# Patient Record
Sex: Male | Born: 1950 | Race: White | Hispanic: No | Marital: Married | Smoking: Former smoker
Health system: Southern US, Community
[De-identification: ages and names within clinical notes are randomized; demographics above are authoritative.]

## PROBLEM LIST (undated history)

## (undated) DIAGNOSIS — R030 Elevated blood-pressure reading, without diagnosis of hypertension: Secondary | ICD-10-CM

## (undated) DIAGNOSIS — I2510 Atherosclerotic heart disease of native coronary artery without angina pectoris: Secondary | ICD-10-CM

## (undated) DIAGNOSIS — N529 Male erectile dysfunction, unspecified: Secondary | ICD-10-CM

## (undated) DIAGNOSIS — E785 Hyperlipidemia, unspecified: Secondary | ICD-10-CM

## (undated) DIAGNOSIS — E291 Testicular hypofunction: Secondary | ICD-10-CM

## (undated) DIAGNOSIS — R002 Palpitations: Secondary | ICD-10-CM

## (undated) DIAGNOSIS — Z8679 Personal history of other diseases of the circulatory system: Secondary | ICD-10-CM

## (undated) DIAGNOSIS — K449 Diaphragmatic hernia without obstruction or gangrene: Secondary | ICD-10-CM

## (undated) DIAGNOSIS — G4731 Primary central sleep apnea: Secondary | ICD-10-CM

## (undated) DIAGNOSIS — M1990 Unspecified osteoarthritis, unspecified site: Secondary | ICD-10-CM

## (undated) DIAGNOSIS — I10 Essential (primary) hypertension: Secondary | ICD-10-CM

## (undated) DIAGNOSIS — I480 Paroxysmal atrial fibrillation: Secondary | ICD-10-CM

## (undated) DIAGNOSIS — K219 Gastro-esophageal reflux disease without esophagitis: Secondary | ICD-10-CM

## (undated) HISTORY — DX: Testicular hypofunction: E29.1

## (undated) HISTORY — DX: Primary central sleep apnea: G47.31

## (undated) HISTORY — PX: EYE SURGERY: SHX253

## (undated) HISTORY — DX: Hyperlipidemia, unspecified: E78.5

## (undated) HISTORY — PX: VASECTOMY: SHX75

## (undated) HISTORY — DX: Male erectile dysfunction, unspecified: N52.9

## (undated) HISTORY — DX: Essential (primary) hypertension: I10

## (undated) HISTORY — DX: Diaphragmatic hernia without obstruction or gangrene: K44.9

## (undated) HISTORY — PX: CARDIAC CATHETERIZATION: SHX172

## (undated) HISTORY — DX: Unspecified osteoarthritis, unspecified site: M19.90

## (undated) HISTORY — DX: Gastro-esophageal reflux disease without esophagitis: K21.9

## (undated) HISTORY — DX: Paroxysmal atrial fibrillation: I48.0

## (undated) HISTORY — DX: Personal history of other diseases of the circulatory system: Z86.79

## (undated) HISTORY — DX: Elevated blood-pressure reading, without diagnosis of hypertension: R03.0

---

## 1985-04-10 HISTORY — PX: NEPHRECTOMY LIVING DONOR: SUR877

## 1992-04-10 HISTORY — PX: SPINE SURGERY: SHX786

## 1992-04-10 HISTORY — PX: BACK SURGERY: SHX140

## 1998-12-03 ENCOUNTER — Emergency Department (HOSPITAL_COMMUNITY): Admission: EM | Admit: 1998-12-03 | Discharge: 1998-12-03 | Payer: Self-pay | Admitting: Emergency Medicine

## 1998-12-04 ENCOUNTER — Encounter: Payer: Self-pay | Admitting: Emergency Medicine

## 1998-12-04 ENCOUNTER — Encounter: Payer: Self-pay | Admitting: General Surgery

## 1999-06-14 ENCOUNTER — Ambulatory Visit (HOSPITAL_COMMUNITY): Admission: RE | Admit: 1999-06-14 | Discharge: 1999-06-14 | Payer: Self-pay | Admitting: *Deleted

## 2004-02-24 ENCOUNTER — Ambulatory Visit: Payer: Self-pay | Admitting: Sports Medicine

## 2004-03-29 ENCOUNTER — Ambulatory Visit: Payer: Self-pay | Admitting: Family Medicine

## 2004-04-20 ENCOUNTER — Ambulatory Visit: Payer: Self-pay | Admitting: Sports Medicine

## 2011-01-17 ENCOUNTER — Encounter: Payer: Self-pay | Admitting: Family Medicine

## 2011-01-17 ENCOUNTER — Ambulatory Visit (INDEPENDENT_AMBULATORY_CARE_PROVIDER_SITE_OTHER): Payer: Managed Care, Other (non HMO) | Admitting: Family Medicine

## 2011-01-17 VITALS — BP 140/80 | HR 84 | Temp 98.7°F | Ht 68.75 in | Wt 204.0 lb

## 2011-01-17 DIAGNOSIS — IMO0002 Reserved for concepts with insufficient information to code with codable children: Secondary | ICD-10-CM

## 2011-01-17 DIAGNOSIS — S86919A Strain of unspecified muscle(s) and tendon(s) at lower leg level, unspecified leg, initial encounter: Secondary | ICD-10-CM

## 2011-01-17 NOTE — Progress Notes (Signed)
  Subjective:    Patient ID: Gabriel Aguilar, male    DOB: December 24, 1950, 60 y.o.   MRN: [id number]  HPI 60 yr old male to establish with [place] and for left leg pain. He had seen [facility] in [facility] before transfering here. On 11-22-10 while pushing a load of stones up a hill at home he felt a sharp tearing pain in the left calf. This slowly improved until he re-injured the calf on 12-12-10 when he was running to catch a boat. Now it has again been slowly improving with some occasional pain. It swelled at first but not now.    Review of Systems  Constitutional: Negative.   Respiratory: Negative.   Cardiovascular: Negative.   Musculoskeletal: Positive for myalgias.       Objective:   Physical Exam  Constitutional: He appears well-developed and well-nourished.       Walks normally   Cardiovascular: Normal rate, regular rhythm, normal heart sounds and intact distal pulses.   Pulmonary/Chest: Effort normal and breath sounds normal.  Musculoskeletal: Normal range of motion. He exhibits no edema.       Mildly tender in the left calf, no cords felt, negative Homans           Assessment & Plan:  Rest, this should heal in time. He will set up a cpx with labs soon

## 2011-02-16 ENCOUNTER — Other Ambulatory Visit (INDEPENDENT_AMBULATORY_CARE_PROVIDER_SITE_OTHER): Payer: Managed Care, Other (non HMO)

## 2011-02-16 DIAGNOSIS — Z0000 Encounter for general adult medical examination without abnormal findings: Secondary | ICD-10-CM

## 2011-02-16 LAB — CBC WITH DIFFERENTIAL/PLATELET
Basophils Absolute: 0 10*3/uL (ref 0.0–0.1)
Basophils Relative: 0.4 % (ref 0.0–3.0)
Eosinophils Absolute: 0.2 10*3/uL (ref 0.0–0.7)
Eosinophils Relative: 2 % (ref 0.0–5.0)
HCT: 42.4 % (ref 39.0–52.0)
Hemoglobin: 14.3 g/dL (ref 13.0–17.0)
Lymphocytes Relative: 44.9 % (ref 12.0–46.0)
Lymphs Abs: 4.8 10*3/uL — ABNORMAL HIGH (ref 0.7–4.0)
MCHC: 33.8 g/dL (ref 30.0–36.0)
MCV: 93.4 fl (ref 78.0–100.0)
Monocytes Absolute: 0.7 10*3/uL (ref 0.1–1.0)
Monocytes Relative: 6.9 % (ref 3.0–12.0)
Neutro Abs: 4.9 10*3/uL (ref 1.4–7.7)
Neutrophils Relative %: 45.8 % (ref 43.0–77.0)
Platelets: 348 10*3/uL (ref 150.0–400.0)
RBC: 4.53 Mil/uL (ref 4.22–5.81)
RDW: 13.7 % (ref 11.5–14.6)
WBC: 10.6 10*3/uL — ABNORMAL HIGH (ref 4.5–10.5)

## 2011-02-16 LAB — POCT URINALYSIS DIPSTICK
Bilirubin, UA: NEGATIVE
Glucose, UA: NEGATIVE
Ketones, UA: NEGATIVE
Leukocytes, UA: NEGATIVE
Nitrite, UA: NEGATIVE
Protein, UA: NEGATIVE
Spec Grav, UA: <=1.005
Urobilinogen, UA: 0.2
pH, UA: 5

## 2011-02-16 LAB — BASIC METABOLIC PANEL
BUN: 19 mg/dL (ref 6–23)
CO2: 28 mEq/L (ref 19–32)
Calcium: 9.3 mg/dL (ref 8.4–10.5)
Chloride: 104 mEq/L (ref 96–112)
Creatinine, Ser: 1.1 mg/dL (ref 0.4–1.5)
GFR: 73.58 mL/min (ref >60.00)
Glucose, Bld: 87 mg/dL (ref 70–99)
Potassium: 4.7 mEq/L (ref 3.5–5.1)
Sodium: 138 mEq/L (ref 135–145)

## 2011-02-16 LAB — HEPATIC FUNCTION PANEL
ALT: 31 U/L (ref 0–53)
AST: 25 U/L (ref 0–37)
Albumin: 4.2 g/dL (ref 3.5–5.2)
Alkaline Phosphatase: 67 U/L (ref 39–117)
Bilirubin, Direct: 0.1 mg/dL (ref 0.0–0.3)
Total Bilirubin: 0.8 mg/dL (ref 0.3–1.2)
Total Protein: 6.7 g/dL (ref 6.0–8.3)

## 2011-02-16 LAB — LIPID PANEL
Cholesterol: 257 mg/dL — ABNORMAL HIGH (ref 0–200)
HDL: 45.3 mg/dL (ref >39.00)
Total CHOL/HDL Ratio: 6
Triglycerides: 219 mg/dL — ABNORMAL HIGH (ref 0.0–149.0)
VLDL: 43.8 mg/dL — ABNORMAL HIGH (ref 0.0–40.0)

## 2011-02-16 LAB — LDL CHOLESTEROL, DIRECT: Direct LDL: 180.7 mg/dL

## 2011-02-16 LAB — TSH: TSH: 0.86 u[IU]/mL (ref 0.35–5.50)

## 2011-02-16 LAB — PSA: PSA: 1.52 ng/mL (ref 0.10–4.00)

## 2011-02-20 NOTE — Progress Notes (Signed)
Quick Note:  I left voice message with results. ______ 

## 2011-02-22 ENCOUNTER — Encounter: Payer: Self-pay | Admitting: Family Medicine

## 2011-02-22 ENCOUNTER — Ambulatory Visit (INDEPENDENT_AMBULATORY_CARE_PROVIDER_SITE_OTHER): Payer: Managed Care, Other (non HMO) | Admitting: Family Medicine

## 2011-02-22 VITALS — BP 130/72 | HR 87 | Temp 99.1°F | Ht 68.5 in | Wt 205.0 lb

## 2011-02-22 DIAGNOSIS — E291 Testicular hypofunction: Secondary | ICD-10-CM

## 2011-02-22 DIAGNOSIS — Z23 Encounter for immunization: Secondary | ICD-10-CM

## 2011-02-22 DIAGNOSIS — Z0000 Encounter for general adult medical examination without abnormal findings: Secondary | ICD-10-CM

## 2011-02-22 LAB — TESTOSTERONE: Testosterone: 197.64 ng/dL — ABNORMAL LOW (ref 350.00–890.00)

## 2011-02-22 MED ORDER — TADALAFIL 20 MG PO TABS: 20.0000 mg | ORAL_TABLET | Freq: Every day | ORAL | Status: AC | PRN

## 2011-02-22 MED ORDER — TESTOSTERONE 20.25 MG/ACT (1.62%) TD GEL: 4.0000 | Freq: Every day | TRANSDERMAL | Status: DC

## 2011-02-22 NOTE — Progress Notes (Signed)
  Subjective:    Patient ID: Gabriel Aguilar, male    DOB: 18-May-1950, 60 y.o.   MRN: [id number]  HPI 60 yr old male for a cpx. He feels well but needs some refills. He has used Androgel off and on for about 5 years. He has been off it for 6 months now and wants to start it again. He wants to try Cialis again.    Review of Systems  Constitutional: Negative.   HENT: Negative.   Eyes: Negative.   Respiratory: Negative.   Cardiovascular: Negative.   Gastrointestinal: Negative.   Genitourinary: Negative.   Musculoskeletal: Negative.   Skin: Negative.   Neurological: Negative.   Hematological: Negative.   Psychiatric/Behavioral: Negative.        Objective:   Physical Exam  Constitutional: He is oriented to person, place, and time. He appears well-developed and well-nourished. No distress.  HENT:  Head: Normocephalic and atraumatic.  Right Ear: External ear normal.  Left Ear: External ear normal.  Nose: Nose normal.  Mouth/Throat: Oropharynx is clear and moist. No oropharyngeal exudate.  Eyes: Conjunctivae and EOM are normal. Pupils are equal, round, and reactive to light. Right eye exhibits no discharge. Left eye exhibits no discharge. No scleral icterus.  Neck: Neck supple. No JVD present. No tracheal deviation present. No thyromegaly present.  Cardiovascular: Normal rate, regular rhythm, normal heart sounds and intact distal pulses.  Exam reveals no gallop and no friction rub.   No murmur heard.      EKG normal   Pulmonary/Chest: Effort normal and breath sounds normal. No respiratory distress. He has no wheezes. He has no rales. He exhibits no tenderness.  Abdominal: Soft. Bowel sounds are normal. He exhibits no distension and no mass. There is no tenderness. There is no rebound and no guarding.  Genitourinary: Rectum normal, prostate normal and penis normal. Guaiac negative stool. No penile tenderness.  Musculoskeletal: Normal range of motion. He exhibits no edema and no  tenderness.  Lymphadenopathy:    He has no cervical adenopathy.  Neurological: He is alert and oriented to person, place, and time. He has normal reflexes. No cranial nerve deficit. He exhibits normal muscle tone. Coordination normal.  Skin: Skin is warm and dry. No rash noted. He is not diaphoretic. No erythema. No pallor.  Psychiatric: He has a normal mood and affect. His behavior is normal. Judgment and thought content normal.          Assessment & Plan:  Well exam. Get a baseline testosterone today. Restart on Androgel and Cialis. Recheck a level in 6 months

## 2011-02-22 NOTE — Progress Notes (Signed)
Addended by: Aniceto Boss A on: 02/22/2011 04:43 PM   Modules accepted: Orders

## 2011-02-24 NOTE — Progress Notes (Signed)
Quick Note:  Left voice message with results. ______ 

## 2012-05-14 ENCOUNTER — Other Ambulatory Visit (INDEPENDENT_AMBULATORY_CARE_PROVIDER_SITE_OTHER): Payer: Managed Care, Other (non HMO)

## 2012-05-14 DIAGNOSIS — Z0000 Encounter for general adult medical examination without abnormal findings: Secondary | ICD-10-CM

## 2012-05-14 LAB — POCT URINALYSIS DIPSTICK
Bilirubin, UA: NEGATIVE
Glucose, UA: NEGATIVE
Ketones, UA: NEGATIVE
Leukocytes, UA: NEGATIVE
pH, UA: 5.5

## 2012-05-14 LAB — HEPATIC FUNCTION PANEL
ALT: 33 U/L (ref 0–53)
Alkaline Phosphatase: 58 U/L (ref 39–117)
Bilirubin, Direct: 0.1 mg/dL (ref 0.0–0.3)
Total Bilirubin: 0.7 mg/dL (ref 0.3–1.2)
Total Protein: 7.1 g/dL (ref 6.0–8.3)

## 2012-05-14 LAB — CBC WITH DIFFERENTIAL/PLATELET
Basophils Relative: 0.5 % (ref 0.0–3.0)
Eosinophils Absolute: 0.2 10*3/uL (ref 0.0–0.7)
Eosinophils Relative: 2 % (ref 0.0–5.0)
Lymphocytes Relative: 47.2 % — ABNORMAL HIGH (ref 12.0–46.0)
Neutrophils Relative %: 42.8 % — ABNORMAL LOW (ref 43.0–77.0)
RBC: 4.78 Mil/uL (ref 4.22–5.81)
WBC: 11.4 10*3/uL — ABNORMAL HIGH (ref 4.5–10.5)

## 2012-05-14 LAB — BASIC METABOLIC PANEL
Calcium: 9.7 mg/dL (ref 8.4–10.5)
Creatinine, Ser: 1.2 mg/dL (ref 0.4–1.5)
GFR: 66.82 mL/min (ref >60.00)

## 2012-05-14 LAB — LDL CHOLESTEROL, DIRECT: Direct LDL: 162.9 mg/dL

## 2012-05-17 NOTE — Progress Notes (Signed)
Quick Note:  Pt has CPE 2/11. ______

## 2012-05-21 ENCOUNTER — Ambulatory Visit (INDEPENDENT_AMBULATORY_CARE_PROVIDER_SITE_OTHER): Payer: Managed Care, Other (non HMO) | Admitting: Family Medicine

## 2012-05-21 ENCOUNTER — Encounter: Payer: Self-pay | Admitting: Family Medicine

## 2012-05-21 VITALS — BP 138/86 | HR 84 | Temp 98.4°F | Ht 69.25 in | Wt 209.0 lb

## 2012-05-21 DIAGNOSIS — Z0000 Encounter for general adult medical examination without abnormal findings: Secondary | ICD-10-CM

## 2012-05-21 MED ORDER — TAMSULOSIN HCL 0.4 MG PO CAPS: 0.4000 mg | ORAL_CAPSULE | Freq: Every day | ORAL | Status: DC

## 2012-05-21 MED ORDER — ATORVASTATIN CALCIUM 20 MG PO TABS: 20.0000 mg | ORAL_TABLET | Freq: Every day | ORAL | Status: DC

## 2012-05-21 MED ORDER — PHENTERMINE HCL 30 MG PO CAPS: 30.0000 mg | ORAL_CAPSULE | ORAL | Status: DC

## 2012-05-21 NOTE — Progress Notes (Signed)
  Subjective:    Patient ID: Gabriel Aguilar, male    DOB: 05/06/1950, 62 y.o.   MRN: [id number]  HPI 62 yr old male for a cpx. He has a few concerns. First he asks for help losing weight. He exercises but has a hard time controlling his appetite. Also he has a slow urinary stream and has to get up 2-3 times at night to urinate. No discomfort.    Review of Systems  Constitutional: Negative.   HENT: Negative.   Eyes: Negative.   Respiratory: Negative.   Cardiovascular: Negative.   Gastrointestinal: Negative.   Genitourinary: Negative.   Musculoskeletal: Negative.   Skin: Negative.   Neurological: Negative.   Psychiatric/Behavioral: Negative.        Objective:   Physical Exam  Constitutional: He is oriented to person, place, and time. He appears well-developed and well-nourished. No distress.  HENT:  Head: Normocephalic and atraumatic.  Right Ear: External ear normal.  Left Ear: External ear normal.  Nose: Nose normal.  Mouth/Throat: Oropharynx is clear and moist. No oropharyngeal exudate.  Eyes: Conjunctivae and EOM are normal. Pupils are equal, round, and reactive to light. Right eye exhibits no discharge. Left eye exhibits no discharge. No scleral icterus.  Neck: Neck supple. No JVD present. No tracheal deviation present. No thyromegaly present.  Cardiovascular: Normal rate, regular rhythm, normal heart sounds and intact distal pulses.  Exam reveals no gallop and no friction rub.   No murmur heard. EKG normal   Pulmonary/Chest: Effort normal and breath sounds normal. No respiratory distress. He has no wheezes. He has no rales. He exhibits no tenderness.  Abdominal: Soft. Bowel sounds are normal. He exhibits no distension and no mass. There is no tenderness. There is no rebound and no guarding.  Genitourinary: Rectum normal, prostate normal and penis normal. Guaiac negative stool. No penile tenderness.  Musculoskeletal: Normal range of motion. He exhibits no edema and no  tenderness.  Lymphadenopathy:    He has no cervical adenopathy.  Neurological: He is alert and oriented to person, place, and time. He has normal reflexes. No cranial nerve deficit. He exhibits normal muscle tone. Coordination normal.  Skin: Skin is warm and dry. No rash noted. He is not diaphoretic. No erythema. No pallor.  Psychiatric: He has a normal mood and affect. His behavior is normal. Judgment and thought content normal.          Assessment & Plan:  Well exam. Set up his first colonoscopy. Try Phentermine for 6 months. Try Flomax for the BPH. Start on Lipitor for high lipids and recheck labs in 90 days.

## 2012-05-22 ENCOUNTER — Encounter: Payer: Self-pay | Admitting: Family Medicine

## 2012-06-24 ENCOUNTER — Telehealth: Payer: Self-pay | Admitting: *Deleted

## 2012-06-24 ENCOUNTER — Ambulatory Visit (AMBULATORY_SURGERY_CENTER): Payer: Managed Care, Other (non HMO) | Admitting: *Deleted

## 2012-06-24 VITALS — Ht 68.0 in | Wt 203.0 lb

## 2012-06-24 DIAGNOSIS — Z1211 Encounter for screening for malignant neoplasm of colon: Secondary | ICD-10-CM

## 2012-06-24 MED ORDER — NA SULFATE-K SULFATE-MG SULF 17.5-3.13-1.6 GM/177ML PO SOLN: ORAL | Status: DC

## 2012-06-24 NOTE — Progress Notes (Signed)
Patient's last colon was over 10 years ago at [facility] with normal results. Release of information form signed and given to Dionisio Paschal. Phone note also sent.

## 2012-06-24 NOTE — Telephone Encounter (Signed)
Patient is direct for screening colonoscopy with Dr.Kaplan on 07-09-12. He states his last colonoscopy was over 10 years ago at [facility] with "normal" exam. Release of information form filled out and signed by patient and given to Dionisio Paschal.

## 2012-06-25 ENCOUNTER — Encounter: Payer: Self-pay | Admitting: Gastroenterology

## 2012-06-27 NOTE — Telephone Encounter (Signed)
Faxed release today 

## 2012-07-05 NOTE — Telephone Encounter (Signed)
Per [facility] no records on this pt... So no colon reports to be found

## 2012-07-09 ENCOUNTER — Ambulatory Visit (AMBULATORY_SURGERY_CENTER): Payer: Managed Care, Other (non HMO) | Admitting: Gastroenterology

## 2012-07-09 ENCOUNTER — Encounter: Payer: Self-pay | Admitting: Gastroenterology

## 2012-07-09 VITALS — BP 102/64 | HR 58 | Temp 97.4°F | Resp 15 | Ht 68.0 in | Wt 203.0 lb

## 2012-07-09 DIAGNOSIS — Z1211 Encounter for screening for malignant neoplasm of colon: Secondary | ICD-10-CM

## 2012-07-09 DIAGNOSIS — D126 Benign neoplasm of colon, unspecified: Secondary | ICD-10-CM

## 2012-07-09 HISTORY — PX: COLONOSCOPY: SHX174

## 2012-07-09 MED ORDER — SODIUM CHLORIDE 0.9 % IV SOLN: 500.0000 mL | INTRAVENOUS | Status: DC

## 2012-07-09 NOTE — Op Note (Signed)
[facility] [address] [place], [address]   COLONOSCOPY PROCEDURE REPORT  PATIENT: Gabriel Aguilar, Gabriel Aguilar  MR#: [id number] BIRTHDATE: 1950-04-17 , 61  yrs. old GENDER: Male ENDOSCOPIST: Louis Meckel, MD REFERRED [id number] Clent Ridges, M.D. PROCEDURE DATE:  07/09/2012 PROCEDURE:   Colonoscopy with snare polypectomy ASA CLASS:   Class II INDICATIONS:average risk screening. MEDICATIONS: MAC sedation, administered by CRNA and propofol (Diprivan) 250mg  IV  DESCRIPTION OF PROCEDURE:   After the risks benefits and alternatives of the procedure were thoroughly explained, informed consent was obtained.  A digital rectal exam revealed no abnormalities of the rectum.   The LB CF-[id number]  endoscope was introduced through the anus and advanced to the cecum, which was identified by both the appendix and ileocecal valve. No adverse events experienced.   The quality of the prep was Suprep excellent The instrument was then slowly withdrawn as the colon was fully examined.      COLON FINDINGS: A flat polyp measuring 4 mm in size was found in the proximal transverse colon.  A polypectomy was performed with a cold snare.  The resection was complete and the polyp tissue was completely retrieved.   A sessile polyp measuring 3 mm in size was found in the descending colon.  A polypectomy was performed.  A polypectomy was performed.  The resection was complete and the polyp tissue was completely retrieved.   The colon mucosa was otherwise normal.  Retroflexed views revealed no abnormalities. The time to cecum=5 minutes 58 seconds.  Withdrawal time=8 minutes 51 seconds.  The scope was withdrawn and the procedure completed. COMPLICATIONS: There were no complications.  ENDOSCOPIC IMPRESSION: 1.   Flat polyp measuring 4 mm in size was found in the proximal transverse colon; polypectomy was performed with a cold snare 2.   Sessile polyp measuring 3 mm in size was found in  the descending colon; polypectomy was performed; polypectomy was performed 3.   The colon mucosa was otherwise normal  RECOMMENDATIONS: If the polyp(s) removed today are proven to be adenomatous (pre-cancerous) polyps, you will need a repeat colonoscopy in 5 years.  Otherwise you should continue to follow colorectal cancer screening guidelines for "routine risk" patients with colonoscopy in 10 years.  You will receive a letter within 1-2 weeks with the results of your biopsy as well as final recommendations.  Please call my office if you have not received a letter after 3 weeks.   eSigned:  Louis Meckel, MD 07/09/2012 10:10 AM   cc:   PATIENT NAME:  Gabriel Aguilar, Gabriel Aguilar MR#: [id number]

## 2012-07-09 NOTE — Progress Notes (Signed)
Patient did not experience any of the following events: a burn prior to discharge; a fall within the facility; wrong site/side/patient/procedure/implant event; or a hospital transfer or hospital admission upon discharge from the facility. (G8907) Patient did not have preoperative order for IV antibiotic SSI prophylaxis. (G8918)  

## 2012-07-09 NOTE — Patient Instructions (Addendum)

## 2012-07-10 ENCOUNTER — Telehealth: Payer: Self-pay | Admitting: *Deleted

## 2012-07-10 NOTE — Telephone Encounter (Signed)
  Follow up Call-  Call back number 07/09/2012  Post procedure Call Back phone  # [phone]  Permission to leave phone message Yes     Patient questions:  Do you have a fever, pain , or abdominal swelling? no Pain Score  0 *  Have you tolerated food without any problems? yes  Have you been able to return to your normal activities? yes  Do you have any questions about your discharge instructions: Diet   no Medications  no Follow up visit  no  Do you have questions or concerns about your Care? no  Actions: * If pain score is 4 or above: No action needed, pain <4.

## 2012-07-20 ENCOUNTER — Encounter: Payer: Self-pay | Admitting: Gastroenterology

## 2012-09-03 ENCOUNTER — Telehealth: Payer: Self-pay | Admitting: Family Medicine

## 2012-09-03 NOTE — Telephone Encounter (Signed)
Patient Information:  Caller Name: Chrishaun  Phone: [phone]  Patient: Gabriel Aguilar, Gabriel Aguilar  Gender: Male  DOB: 01/28/1951  Age: 62 Years  PCP: [facility]  Office Follow Up:  Does the office need to follow up with this patient?: Yes  Instructions For The Office: Please call ASAP regarding work in appointment.  RN Note:  No appointments remain with Dr Clent Ridges for 09/03/12; message sent to office staff to call back regarding work in appointment.    Symptoms  Reason For Call & Symptoms: Injured left shoulder breaking a backer board yesterday,09/02/12; heard popping noise and felt pain.  Noted decreased range of motion and mild, nagging shoulder discomfort today 09/03/12.  Reviewed Health History In EMR: Yes  Reviewed Medications In EMR: Yes  Reviewed Allergies In EMR: Yes  Reviewed Surgeries / Procedures: Yes  Date of Onset of Symptoms: 09/02/2012  Treatments Tried: Ibuprofen  Treatments Tried Worked: Yes  Guideline(s) Used:  Arm Injury  Shoulder Injury  Disposition Per Guideline:   See Today in Office  Reason For Disposition Reached:   Can't move injured shoulder normally (e.g., full range of motion, able to touch top of head)  Advice Given:  Reassurance - Bending or Twisting Injury (Strain, Sprain):  Here is some care advice that should help.  Apply a Cold Pack:  Apply a cold pack or an ice bag (wrapped in a moist towel) to the area for 20 minutes. Repeat in 1 hour, then every 4 hours while awake.  Continue this for the first 48 hours after an injury.  This will help decrease pain and swelling.  Apply Heat to the Area:  Beginning 48 hours after an injury, apply a warm washcloth or heating pad for 10 minutes three times a day.  This will help increase blood flow and improve healing.  Rest vs. Movement:  Movement is generally more healing in the long term than rest.  Continue normal activities as much as your pain permits.  Avoid heavy lifting and active sports  for 1-2 weeks or until the pain and swelling are gone.  Complete rest should only be used for the first day or two after an injury. If it really hurts too use the arm at all, you will need to see the doctor.  Call Back If:  Pain becomes severe  Pain or swelling lasts more than 2 weeks  You become worse.  Patient Will Follow Care Advice:  YES

## 2012-12-17 ENCOUNTER — Encounter: Payer: Self-pay | Admitting: Family Medicine

## 2012-12-17 ENCOUNTER — Ambulatory Visit (INDEPENDENT_AMBULATORY_CARE_PROVIDER_SITE_OTHER): Payer: Managed Care, Other (non HMO) | Admitting: Family Medicine

## 2012-12-17 VITALS — BP 132/68 | HR 91 | Temp 98.6°F | Wt 202.0 lb

## 2012-12-17 DIAGNOSIS — G4733 Obstructive sleep apnea (adult) (pediatric): Secondary | ICD-10-CM

## 2012-12-17 DIAGNOSIS — I499 Cardiac arrhythmia, unspecified: Secondary | ICD-10-CM

## 2012-12-17 MED ORDER — ASPIRIN EC 81 MG PO TBEC: 81.0000 mg | DELAYED_RELEASE_TABLET | Freq: Every day | ORAL | Status: DC

## 2012-12-17 NOTE — Progress Notes (Signed)
  Subjective:    Patient ID: Gabriel Aguilar, male    DOB: Jun 22, 1950, 62 y.o.   MRN: [id number]  HPI Here for an episode of heart pounding and racing as well as irregular heartbeats that occurred last Saturday night. He tried to check his BP several times but the cuff would not register due to the irregular pulse. He felt some SOB but no chest pains. This lasted about 12 hours from 4 pm to 4 am the next morning, and it stopped as suddenly as it started. He has felt fine ever since. He has had no recent medication changes. He drinks 2 cups of caffeinated coffee each morning. He had a cpx here last February with a normal EKG and normal labs. He notes his sister has a pacemaker for some reason.    Review of Systems  Constitutional: Negative.   Respiratory: Positive for shortness of breath. Negative for cough, choking, chest tightness and wheezing.   Cardiovascular: Positive for palpitations. Negative for chest pain and leg swelling.  Neurological: Negative.        Objective:   Physical Exam  Constitutional: He is oriented to person, place, and time. He appears well-developed and well-nourished. No distress.  Neck: No thyromegaly present.  Cardiovascular: Normal rate, regular rhythm, normal heart sounds and intact distal pulses.  Exam reveals no gallop and no friction rub.   No murmur heard. EKG normal   Pulmonary/Chest: Effort normal and breath sounds normal. No respiratory distress. He has no wheezes. He has no rales.  Musculoskeletal: He exhibits no edema.  Lymphadenopathy:    He has no cervical adenopathy.  Neurological: He is alert and oriented to person, place, and time.          Assessment & Plan:  This sounds like a bout of paroxysmal atrial fibrillation, but it is hard to say. We will set him up to wear a 30 day event monitor, and go from there. He knows to seek immediate attention if this happens again. Start on 81 mg of aspirin daily.

## 2012-12-20 ENCOUNTER — Encounter: Payer: Self-pay | Admitting: *Deleted

## 2012-12-20 ENCOUNTER — Encounter (INDEPENDENT_AMBULATORY_CARE_PROVIDER_SITE_OTHER): Payer: Managed Care, Other (non HMO)

## 2012-12-20 DIAGNOSIS — I499 Cardiac arrhythmia, unspecified: Secondary | ICD-10-CM

## 2012-12-20 DIAGNOSIS — R002 Palpitations: Secondary | ICD-10-CM

## 2012-12-20 NOTE — Progress Notes (Signed)
Patient ID: Gabriel Aguilar, male   DOB: 1950-06-10, 62 y.o.   MRN: [id number] E-Cardio verite 30 day cardiac event monitor applied to patient.

## 2013-01-17 ENCOUNTER — Institutional Professional Consult (permissible substitution): Payer: Managed Care, Other (non HMO) | Admitting: Pulmonary Disease

## 2013-01-24 NOTE — Progress Notes (Signed)
Quick Note:  I spoke with pt ______ 

## 2013-03-05 ENCOUNTER — Encounter: Payer: Self-pay | Admitting: Pulmonary Disease

## 2013-03-05 ENCOUNTER — Ambulatory Visit (INDEPENDENT_AMBULATORY_CARE_PROVIDER_SITE_OTHER): Payer: Managed Care, Other (non HMO) | Admitting: Pulmonary Disease

## 2013-03-05 VITALS — BP 140/80 | HR 78 | Temp 98.0°F | Ht 68.0 in | Wt 209.0 lb

## 2013-03-05 DIAGNOSIS — G4733 Obstructive sleep apnea (adult) (pediatric): Secondary | ICD-10-CM

## 2013-03-05 NOTE — Assessment & Plan Note (Signed)
We will get you CPAP supplies You will qualify for a new machine - advise next year Provide [place] your sleep studies -his CPAP settings are not clear to me  Weight loss encouraged, compliance with goal of at least 4-6 hrs every night is the expectation. Advised against medications with sedative side effects Cautioned against driving when sleepy - understanding that sleepiness will vary on a day to day basis

## 2013-03-05 NOTE — Patient Instructions (Signed)
We will get you CPAP supplies You will qualify for a new machine - advise next year Provide [place] your sleep studies

## 2013-03-05 NOTE — Progress Notes (Signed)
Subjective:    Patient ID: Gabriel Aguilar, male    DOB: April 26, 1950, 62 y.o.   MRN: [id number]  HPI  Chief Complaint  Patient presents with  . Advice Only    Referred by Dr Clent Ridges for sleep apnea.  uses cpap since 2003.  Pt c/o increased waking through the night.   62 year old ex-smoker referred for management of obstructive sleep apnea. He had a sleep study in 2003  which showed severe sleep apnea and was set up by her ResMed S7 elite CPAP machine with nasal pillows, ramp Time of 20 minutes. His DME is [facility], he has not changed supplies in 3 years  Bedtime is around 11 PM, sleep latency is 20 minutes, he sleeps on his side with one pillow, he is one to 2 awakenings for nocturia and is out of bed by 7 AM with dryness of mouth. He denies headaches and feels rested during the day. He may have gained about 10 pounds in the last 10 years. He drinks 4-5 cups of coffee with his last cup being before noon.  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  Past Medical History  Diagnosis Date  . GERD (gastroesophageal reflux disease)   . Hiatal hernia   . Arthritis   . Hyperlipidemia   . Sleep apnea, central     wears a CPAP at night   . Hypogonadism male     has seen  Dr. Ernestine Conrad  . Erectile dysfunction    Past Surgical History  Procedure Laterality Date  . 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 Spine surgery  1994    disc at L4-L5  . Eye surgery      LASIK  . Vasectomy      No Known Allergies  History   Social History  . Marital Status: Married    Spouse Name: N/A    Number of Children: N/A  . Years of Education: N/A   Occupational History  . Not on file.   Social History Main Topics  . Smoking status: Former Smoker -- 1.50 packs/day    Start date: 04/10/1964    Quit date: 03/06/1983  . Smokeless tobacco: Never Used  . Alcohol Use: Yes     Comment: once a month-wine  . Drug Use: No  . Sexual Activity: Not on file    Other Topics Concern  . Not on file   Social History Narrative  . No narrative on file    Family History  Problem Relation Age of Onset  . Hyperlipidemia Father   . Heart disease Father   . Hypertension Father   . Kidney disease Father   . Diabetes Father   . Colon cancer Neg Hx      Review of Systems  Constitutional: Negative for fever, fatigue and unexpected weight change.  HENT: Negative for congestion, dental problem, ear pain, nosebleeds, postnasal drip, rhinorrhea, sinus pressure, sneezing, sore throat and trouble swallowing.   Eyes: Negative for redness and itching.  Respiratory: Positive for shortness of breath. Negative for cough, chest tightness and wheezing.   Cardiovascular: Negative for palpitations and leg swelling.  Gastrointestinal: Negative for nausea and vomiting.  Genitourinary: Negative for dysuria.  Musculoskeletal: Positive for arthralgias. Negative for joint swelling.  Skin: Negative for rash.  Neurological: Negative for headaches.  Hematological: Bruises/bleeds easily.  Psychiatric/Behavioral: Negative for dysphoric mood. The patient is not nervous/anxious.  Objective:   Physical Exam  Gen. Pleasant,  in no distress, normal affect ENT - no lesions, no post nasal drip, class 2-3 airway Neck: No JVD, no thyromegaly, no carotid bruits Lungs: no use of accessory muscles, no dullness to percussion, decreased without rales or rhonchi  Cardiovascular: Rhythm regular, heart sounds  normal, no murmurs or gallops, no peripheral edema Abdomen: soft and non-tender, no hepatosplenomegaly, BS normal. Musculoskeletal: No deformities, no cyanosis or clubbing Neuro:  alert, non focal, no tremors       Assessment & Plan:

## 2013-03-07 ENCOUNTER — Telehealth: Payer: Self-pay | Admitting: Pulmonary Disease

## 2013-03-07 NOTE — Telephone Encounter (Signed)
PSG 11/2001 showed AHI 20/h, predom apneas, longest 70 s, corrected by CPAP 8 cm

## 2013-04-04 ENCOUNTER — Ambulatory Visit (INDEPENDENT_AMBULATORY_CARE_PROVIDER_SITE_OTHER): Payer: Managed Care, Other (non HMO) | Admitting: Internal Medicine

## 2013-04-04 ENCOUNTER — Ambulatory Visit (INDEPENDENT_AMBULATORY_CARE_PROVIDER_SITE_OTHER)
Admission: RE | Admit: 2013-04-04 | Discharge: 2013-04-04 | Disposition: A | Discharge status: Home / Self Care | Payer: Managed Care, Other (non HMO) | Source: Ambulatory Visit | Attending: Internal Medicine | Admitting: Internal Medicine

## 2013-04-04 ENCOUNTER — Encounter: Payer: Self-pay | Admitting: Internal Medicine

## 2013-04-04 VITALS — BP 122/70 | HR 104 | Temp 99.6°F | Wt 207.0 lb

## 2013-04-04 DIAGNOSIS — R6889 Other general symptoms and signs: Secondary | ICD-10-CM

## 2013-04-04 DIAGNOSIS — R0602 Shortness of breath: Secondary | ICD-10-CM

## 2013-04-04 DIAGNOSIS — R509 Fever, unspecified: Secondary | ICD-10-CM

## 2013-04-04 LAB — BASIC METABOLIC PANEL
CO2: 24 mEq/L (ref 19–32)
Calcium: 9.3 mg/dL (ref 8.4–10.5)
Creatinine, Ser: 1.2 mg/dL (ref 0.4–1.5)

## 2013-04-04 LAB — POCT INFLUENZA A/B
Influenza A, POC: NEGATIVE
Influenza B, POC: NEGATIVE

## 2013-04-04 LAB — CBC WITH DIFFERENTIAL/PLATELET
Basophils Relative: 0.8 % (ref 0.0–3.0)
Eosinophils Absolute: 0.2 10*3/uL (ref 0.0–0.7)
Lymphocytes Relative: 34 % (ref 12.0–46.0)
MCHC: 33.8 g/dL (ref 30.0–36.0)
Neutrophils Relative %: 52.1 % (ref 43.0–77.0)
RBC: 4.78 Mil/uL (ref 4.22–5.81)
WBC: 9.6 10*3/uL (ref 4.5–10.5)

## 2013-04-04 MED ORDER — HYDROCODONE-HOMATROPINE 5-1.5 MG/5ML PO SYRP: 5.0000 mL | ORAL_SOLUTION | ORAL | Status: DC | PRN

## 2013-04-04 NOTE — Progress Notes (Signed)
Pre visit review using our clinic review tool, if applicable. No additional management support is needed unless otherwise documented below in the visit note. Chief Complaint  Patient presents with  . Cough    fever, sweating, dry mouth, nausea, body aches, chill x 1 week     HPI: Patient comes in today for SDA for  new problem evaluation. Here with wife .  Onset  Last Friday 19 after  Working trying to fix water problem at home.  Called emergency  This am when felt faint like and nausea and broke out in sweat.   Was better when ems got there.   Was better so didn't go to hospital.  Bad cough  Increase  Dry mouth and  Increase urination .  No hxof dm    Has been  Taking dayquil and niquil and ? Tylenol.  Almost around the clock until this afternoon  ROS: See pertinent positives and negatives per HPI. No hemoptysis  No vomiting unusual rash  No underlying disease problems  No hx lung disease  Has OSA nose congested had to use nose spray to use machine .  Past Medical History  Diagnosis Date  . GERD (gastroesophageal reflux disease)   . Hiatal hernia   . Arthritis   . Hyperlipidemia   . Sleep apnea, central     wears a CPAP at night   . Hypogonadism male     has seen  Dr. Ernestine Conrad  . Erectile dysfunction     Family History  Problem Relation Age of Onset  . Hyperlipidemia Father   . Heart disease Father   . Hypertension Father   . Kidney disease Father   . Diabetes Father   . Colon cancer Neg Hx     History   Social History  . Marital Status: Married    Spouse Name: N/A    Number of Children: N/A  . Years of Education: N/A   Social History Main Topics  . Smoking status: Former Smoker -- 1.50 packs/day    Start date: 04/10/1964    Quit date: 03/06/1983  . Smokeless tobacco: Never Used  . Alcohol Use: Yes     Comment: once a month-wine  . Drug Use: No  . Sexual Activity: None   Other Topics Concern  . None   Social History Narrative  . None    Outpatient  Encounter Prescriptions as of 04/04/2013  Medication Sig  . aspirin EC 81 MG tablet Take 1 tablet (81 mg total) by mouth daily.  [name] atorvastatin (LIPITOR) 20 MG tablet Take 1 tablet (20 mg total) by mouth daily.  . OMEGA 3 1000 MG CAPS Take 2 capsules by mouth daily.  [name] HYDROcodone-homatropine (HYCODAN) 5-1.5 MG/5ML syrup Take 5 mLs by mouth every 4 (four) hours as needed for cough.    EXAM:  BP 122/70  Pulse 104  Temp(Src) 99.6 F (37.6 C) (Oral)  Wt 207 lb (93.895 kg)  SpO2 98%  Body mass index is 31.48 kg/(m^2).  GENERAL: vitals reviewed and listed above, alert, oriented, appears well hydrated and in no acute distress ill; non toxic and normal speech  HEENT: atraumatic, conjunctiva  clear, no obvious abnormalities on inspection of external nose and ears congested face non tender  OP : no lesion edema or exudate  Moist mucous membranes  NECK: no obvious masses on inspection palpation no adenopathy  LUNGS: clear to auscultation bilaterally, no wheezes, rales or rhonchi, good air movement? CV: HRRR, no clubbing cyanosis or  peripheral edema nl cap refill  Abdomen:  Sof,t normal bowel sounds without hepatosplenomegaly, no guarding rebound or masses no CVA tenderness Skin: normal capillary refill ,turgor , color: No acute rashes ,petechiae or bruising MS: moves all extremities without noticeable focal  abnormality PSYCH: pleasant and cooperative, no obvious depression or anxiety  ASSESSMENT AND PLAN:  Discussed the following assessment and plan:  Flu-like symptoms - Plan: Basic metabolic panel, CBC with Differential, POCT Influenza A/B, DG Chest 2 View  SOB (shortness of breath) - Plan: Basic metabolic panel, CBC with Differential, POCT Influenza A/B, DG Chest 2 View  Fever, unspecified - Plan: Basic metabolic panel, CBC with Differential, POCT Influenza A/B, DG Chest 2 View May be taking too much cold and fever meds and getting side effects concern about poss pna enev though no  obv  pna on exam .  X ray labs today . -Patient advised to return or notify health care team  if symptoms worsen or persist or new concerns arise.  Patient Instructions  Get  chest x ray   To check for pneumonia  You may be taking too much cold and fever medication  Take no more than every 6 hours  For tylenoil.  Then plan  Other treatment   Neta Mends. Panosh M.D.

## 2013-04-04 NOTE — Patient Instructions (Signed)
Get  chest x ray   To check for pneumonia  You may be taking too much cold and fever medication  Take no more than every 6 hours  For tylenoil.  Then plan  Other treatment

## 2013-05-26 ENCOUNTER — Other Ambulatory Visit (INDEPENDENT_AMBULATORY_CARE_PROVIDER_SITE_OTHER): Payer: Managed Care, Other (non HMO)

## 2013-05-26 DIAGNOSIS — Z0000 Encounter for general adult medical examination without abnormal findings: Secondary | ICD-10-CM

## 2013-05-26 LAB — POCT URINALYSIS DIPSTICK
Bilirubin, UA: NEGATIVE
Glucose, UA: NEGATIVE
KETONES UA: NEGATIVE
Leukocytes, UA: NEGATIVE
Nitrite, UA: NEGATIVE
PH UA: 5.5
PROTEIN UA: NEGATIVE
SPEC GRAV UA: 1.02
UROBILINOGEN UA: 0.2

## 2013-05-26 LAB — LIPID PANEL
CHOLESTEROL: 242 mg/dL — AB (ref 0–200)
HDL: 41.3 mg/dL (ref >39.00)
Total CHOL/HDL Ratio: 6
Triglycerides: 158 mg/dL — ABNORMAL HIGH (ref 0.0–149.0)
VLDL: 31.6 mg/dL (ref 0.0–40.0)

## 2013-05-26 LAB — CBC WITH DIFFERENTIAL/PLATELET
BASOS ABS: 0 10*3/uL (ref 0.0–0.1)
BASOS PCT: 0.3 % (ref 0.0–3.0)
EOS ABS: 0.3 10*3/uL (ref 0.0–0.7)
Eosinophils Relative: 2.7 % (ref 0.0–5.0)
HCT: 42.3 % (ref 39.0–52.0)
HEMOGLOBIN: 14.1 g/dL (ref 13.0–17.0)
Lymphocytes Relative: 47.5 % — ABNORMAL HIGH (ref 12.0–46.0)
Lymphs Abs: 5.1 10*3/uL — ABNORMAL HIGH (ref 0.7–4.0)
MCHC: 33.3 g/dL (ref 30.0–36.0)
MCV: 93 fl (ref 78.0–100.0)
MONO ABS: 0.6 10*3/uL (ref 0.1–1.0)
Monocytes Relative: 6 % (ref 3.0–12.0)
NEUTROS ABS: 4.7 10*3/uL (ref 1.4–7.7)
Neutrophils Relative %: 43.5 % (ref 43.0–77.0)
Platelets: 419 10*3/uL — ABNORMAL HIGH (ref 150.0–400.0)
RBC: 4.55 Mil/uL (ref 4.22–5.81)
RDW: 13.8 % (ref 11.5–14.6)
WBC: 10.7 10*3/uL — ABNORMAL HIGH (ref 4.5–10.5)

## 2013-05-26 LAB — BASIC METABOLIC PANEL
BUN: 17 mg/dL (ref 6–23)
CALCIUM: 9.4 mg/dL (ref 8.4–10.5)
CO2: 26 mEq/L (ref 19–32)
CREATININE: 1.2 mg/dL (ref 0.4–1.5)
Chloride: 102 mEq/L (ref 96–112)
GFR: 64.68 mL/min (ref >60.00)
Glucose, Bld: 89 mg/dL (ref 70–99)
Potassium: 4.7 mEq/L (ref 3.5–5.1)
Sodium: 136 mEq/L (ref 135–145)

## 2013-05-26 LAB — HEPATIC FUNCTION PANEL
ALBUMIN: 3.9 g/dL (ref 3.5–5.2)
ALK PHOS: 52 U/L (ref 39–117)
ALT: 36 U/L (ref 0–53)
AST: 32 U/L (ref 0–37)
Bilirubin, Direct: 0.1 mg/dL (ref 0.0–0.3)
TOTAL PROTEIN: 6.7 g/dL (ref 6.0–8.3)
Total Bilirubin: 0.7 mg/dL (ref 0.3–1.2)

## 2013-05-26 LAB — LDL CHOLESTEROL, DIRECT: Direct LDL: 179.2 mg/dL

## 2013-05-26 LAB — TSH: TSH: 0.75 u[IU]/mL (ref 0.35–5.50)

## 2013-05-26 LAB — PSA: PSA: 1.96 ng/mL (ref 0.10–4.00)

## 2013-05-28 ENCOUNTER — Encounter: Payer: Managed Care, Other (non HMO) | Admitting: Family Medicine

## 2013-05-29 MED ORDER — ATORVASTATIN CALCIUM 40 MG PO TABS: 40.0000 mg | ORAL_TABLET | Freq: Every day | ORAL | Status: DC

## 2013-05-29 NOTE — Addendum Note (Signed)
Addended by: Aggie Hacker A on: 05/29/2013 12:24 PM   Modules accepted: Orders

## 2013-05-29 NOTE — Addendum Note (Signed)
Addended by: Aggie Hacker A on: 05/29/2013 12:23 PM   Modules accepted: Orders, Medications

## 2013-06-02 ENCOUNTER — Ambulatory Visit (INDEPENDENT_AMBULATORY_CARE_PROVIDER_SITE_OTHER): Payer: Managed Care, Other (non HMO) | Admitting: Family Medicine

## 2013-06-02 ENCOUNTER — Encounter: Payer: Self-pay | Admitting: Family Medicine

## 2013-06-02 VITALS — BP 124/72 | HR 85 | Temp 98.3°F | Ht 69.0 in | Wt 204.0 lb

## 2013-06-02 DIAGNOSIS — Z0000 Encounter for general adult medical examination without abnormal findings: Secondary | ICD-10-CM

## 2013-06-02 DIAGNOSIS — E785 Hyperlipidemia, unspecified: Secondary | ICD-10-CM | POA: Insufficient documentation

## 2013-06-02 MED ORDER — OMEPRAZOLE 40 MG PO CPDR: 40.0000 mg | DELAYED_RELEASE_CAPSULE | Freq: Every day | ORAL | Status: DC

## 2013-06-02 NOTE — Progress Notes (Signed)
Pre visit review using our clinic review tool, if applicable. No additional management support is needed unless otherwise documented below in the visit note. 

## 2013-06-02 NOTE — Progress Notes (Signed)
   Subjective:    Patient ID: Gabriel Aguilar, male    DOB: 1950/10/22, 63 y.o.   MRN: [id number]  HPI 63 yr old male for a cpx. He has a few issues to discuss. First he describes a frequent dry hacking cough which is worst for several hours after a meal. He has frequent heartburn as well and sometimes takes prilosec OTC for this. No SOB. Also he is concerned about his lipid levels. He admits to eating a lot of fatty foods like fried chicken.    Review of Systems  Constitutional: Negative.   HENT: Negative.   Eyes: Negative.   Respiratory: Positive for cough. Negative for apnea, choking, chest tightness, shortness of breath, wheezing and stridor.   Cardiovascular: Negative.   Gastrointestinal: Negative.   Genitourinary: Negative.   Musculoskeletal: Negative.   Skin: Negative.   Neurological: Negative.   Psychiatric/Behavioral: Negative.        Objective:   Physical Exam  Constitutional: He is oriented to person, place, and time. He appears well-developed and well-nourished. No distress.  HENT:  Head: Normocephalic and atraumatic.  Right Ear: External ear normal.  Left Ear: External ear normal.  Nose: Nose normal.  Mouth/Throat: Oropharynx is clear and moist. No oropharyngeal exudate.  Eyes: Conjunctivae and EOM are normal. Pupils are equal, round, and reactive to light. Right eye exhibits no discharge. Left eye exhibits no discharge. No scleral icterus.  Neck: Neck supple. No JVD present. No tracheal deviation present. No thyromegaly present.  Cardiovascular: Normal rate, regular rhythm, normal heart sounds and intact distal pulses.  Exam reveals no gallop and no friction rub.   No murmur heard. Pulmonary/Chest: Effort normal and breath sounds normal. No respiratory distress. He has no wheezes. He has no rales. He exhibits no tenderness.  Abdominal: Soft. Bowel sounds are normal. He exhibits no distension and no mass. There is no tenderness. There is no rebound and no guarding.    Genitourinary: Rectum normal, prostate normal and penis normal. Guaiac negative stool. No penile tenderness.  Musculoskeletal: Normal range of motion. He exhibits no edema and no tenderness.  Lymphadenopathy:    He has no cervical adenopathy.  Neurological: He is alert and oriented to person, place, and time. He has normal reflexes. No cranial nerve deficit. He exhibits normal muscle tone. Coordination normal.  Skin: Skin is warm and dry. No rash noted. He is not diaphoretic. No erythema. No pallor.  Psychiatric: He has a normal mood and affect. His behavior is normal. Judgment and thought content normal.          Assessment & Plan:  Well exam. We went over his dietary recommendations again and we will increase Lipitor to 40 mg daily. His cough may be from GERD so he will try Omeprazole 40 mg daily.

## 2013-08-27 ENCOUNTER — Encounter: Payer: Self-pay | Admitting: Family Medicine

## 2013-08-28 NOTE — Telephone Encounter (Signed)
Tell him that of course I cannot answer this question without an OV to examine it

## 2013-09-16 ENCOUNTER — Ambulatory Visit (INDEPENDENT_AMBULATORY_CARE_PROVIDER_SITE_OTHER): Payer: Managed Care, Other (non HMO) | Admitting: Family Medicine

## 2013-09-16 ENCOUNTER — Encounter: Payer: Self-pay | Admitting: Family Medicine

## 2013-09-16 VITALS — BP 158/87 | HR 85 | Temp 98.8°F | Ht 69.0 in | Wt 210.0 lb

## 2013-09-16 DIAGNOSIS — M7020 Olecranon bursitis, unspecified elbow: Secondary | ICD-10-CM

## 2013-09-16 DIAGNOSIS — E785 Hyperlipidemia, unspecified: Secondary | ICD-10-CM

## 2013-09-16 DIAGNOSIS — K5900 Constipation, unspecified: Secondary | ICD-10-CM

## 2013-09-16 MED ORDER — DICLOFENAC SODIUM 75 MG PO TBEC: 75.0000 mg | DELAYED_RELEASE_TABLET | Freq: Two times a day (BID) | ORAL | Status: DC

## 2013-09-16 NOTE — Progress Notes (Signed)
   Subjective:    Patient ID: Gabriel Aguilar, male    DOB: [birth year], 63 y.o.   MRN: [id number]  HPI Here for several things. First he has had one week of swelling and pain at the tip of the right elbow. No trauma but he does work on computers all day. Also he has had some constipation lately and once when straining to pass a stool he had some ejaculate come out of the penis. No urethral pain or urinary sx.    Review of Systems  Constitutional: Negative.   Gastrointestinal: Positive for constipation. Negative for nausea, vomiting, abdominal pain, blood in stool, abdominal distention and anal bleeding.  Genitourinary: Negative.   Musculoskeletal: Positive for arthralgias and joint swelling.       Objective:   Physical Exam  Constitutional: He appears well-developed and well-nourished.  Abdominal: Soft. Bowel sounds are normal. He exhibits no distension and no mass. There is no tenderness. There is no rebound and no guarding.  Musculoskeletal:  The right olecranon bursa is slightly swollen and tender. No warmth or erythema.           Assessment & Plan:  Try Miralax daily to keep the stools soft. Rest the elbow and use Diclofenac and ice packs

## 2013-09-16 NOTE — Progress Notes (Signed)
Pre visit review using our clinic review tool, if applicable. No additional management support is needed unless otherwise documented below in the visit note. 

## 2013-09-18 ENCOUNTER — Other Ambulatory Visit (INDEPENDENT_AMBULATORY_CARE_PROVIDER_SITE_OTHER): Payer: Managed Care, Other (non HMO)

## 2013-09-18 DIAGNOSIS — E785 Hyperlipidemia, unspecified: Secondary | ICD-10-CM

## 2013-09-18 LAB — LIPID PANEL
CHOL/HDL RATIO: 5
Cholesterol: 174 mg/dL (ref 0–200)
HDL: 35.5 mg/dL — ABNORMAL LOW (ref >39.00)
LDL Cholesterol: 99 mg/dL (ref 0–99)
NONHDL: 138.5
Triglycerides: 196 mg/dL — ABNORMAL HIGH (ref 0.0–149.0)
VLDL: 39.2 mg/dL (ref 0.0–40.0)

## 2013-09-18 LAB — HEPATIC FUNCTION PANEL
ALT: 39 U/L (ref 0–53)
AST: 33 U/L (ref 0–37)
Albumin: 4.1 g/dL (ref 3.5–5.2)
Alkaline Phosphatase: 56 U/L (ref 39–117)
BILIRUBIN DIRECT: 0 mg/dL (ref 0.0–0.3)
Total Bilirubin: 0.7 mg/dL (ref 0.2–1.2)
Total Protein: 6.9 g/dL (ref 6.0–8.3)

## 2014-01-22 ENCOUNTER — Telehealth: Payer: Self-pay | Admitting: Family Medicine

## 2014-01-22 NOTE — Telephone Encounter (Signed)
phrm calle bc pt's insurance is now requiring 90 refill for his atorvastatin (LIPITOR) 40 MG tablet Can you send new rx [facility]?

## 2014-01-23 MED ORDER — ATORVASTATIN CALCIUM 40 MG PO TABS: 40.0000 mg | ORAL_TABLET | Freq: Every day | ORAL | Status: DC

## 2014-01-23 NOTE — Telephone Encounter (Signed)
I sent script e-scribe. 

## 2014-01-28 ENCOUNTER — Telehealth: Payer: Self-pay | Admitting: Family Medicine

## 2014-01-28 NOTE — Telephone Encounter (Signed)
[facility] # [place], [place] - [place] states they need a 90 day re-fill on omeprazole (PRILOSEC) 40 MG capsule

## 2014-01-30 ENCOUNTER — Telehealth: Payer: Self-pay | Admitting: Family Medicine

## 2014-01-30 MED ORDER — OMEPRAZOLE 40 MG PO CPDR: 40.0000 mg | DELAYED_RELEASE_CAPSULE | Freq: Every day | ORAL | Status: DC

## 2014-01-30 NOTE — Telephone Encounter (Signed)
[facility] # [place], [place] is requesting re-fill on phentermine 30 MG capsule

## 2014-01-30 NOTE — Telephone Encounter (Signed)
rx sent in electronically 

## 2014-02-02 NOTE — Telephone Encounter (Signed)
Call in #[phone] with 5 rf 

## 2014-02-03 MED ORDER — PHENTERMINE HCL 30 MG PO CAPS: 30.0000 mg | ORAL_CAPSULE | ORAL | Status: DC

## 2014-02-03 NOTE — Telephone Encounter (Signed)
2nd re-fill request was received for the below medication

## 2014-02-03 NOTE — Telephone Encounter (Signed)
I called in script 

## 2014-02-11 ENCOUNTER — Telehealth: Payer: Self-pay | Admitting: Family Medicine

## 2014-02-11 MED ORDER — OMEPRAZOLE 40 MG PO CPDR: 40.0000 mg | DELAYED_RELEASE_CAPSULE | Freq: Every day | ORAL | Status: DC

## 2014-02-11 NOTE — Telephone Encounter (Signed)
I sent script e-scribe. 

## 2014-02-11 NOTE — Telephone Encounter (Signed)
[facility] # [place], [place] - [place] is requesting 90 day re-fills on omeprazole (PRILOSEC) 40 MG capsule

## 2014-05-28 ENCOUNTER — Other Ambulatory Visit: Payer: Self-pay | Admitting: Family Medicine

## 2014-05-28 ENCOUNTER — Telehealth: Payer: Self-pay | Admitting: Family Medicine

## 2014-05-28 DIAGNOSIS — G4733 Obstructive sleep apnea (adult) (pediatric): Secondary | ICD-10-CM

## 2014-05-28 MED ORDER — OMEPRAZOLE 40 MG PO CPDR: 40.0000 mg | DELAYED_RELEASE_CAPSULE | Freq: Every day | ORAL | Status: DC

## 2014-05-28 MED ORDER — ATORVASTATIN CALCIUM 40 MG PO TABS: 40.0000 mg | ORAL_TABLET | Freq: Every day | ORAL | Status: DC

## 2014-05-28 NOTE — Telephone Encounter (Signed)
Rx sent to [facility]

## 2014-05-28 NOTE — Telephone Encounter (Signed)
Pt request refill atorvastatin (LIPITOR) 40 MG tablet and  omeprazole (PRILOSEC) 40 MG capsule 90 day Pt must now go to [facility] for refills due to new insurance

## 2014-05-28 NOTE — Telephone Encounter (Signed)
Pt needs referral to sleep doctor. Pt needs to get his CPAP updated b/c it is so old.pt has new insurance [facility]) and needs someone in his network. Pt went to Dr Elbert Ewings before and does not care who he sees this time. Pt is sending me his new insurance card

## 2014-05-28 NOTE — Telephone Encounter (Signed)
done

## 2014-05-29 NOTE — Telephone Encounter (Signed)
I spoke with pt  

## 2014-06-16 ENCOUNTER — Other Ambulatory Visit: Payer: Self-pay | Admitting: *Deleted

## 2014-06-16 MED ORDER — OMEPRAZOLE 40 MG PO CPDR: 40.0000 mg | DELAYED_RELEASE_CAPSULE | Freq: Every day | ORAL | Status: DC

## 2014-07-16 ENCOUNTER — Ambulatory Visit (INDEPENDENT_AMBULATORY_CARE_PROVIDER_SITE_OTHER): Payer: 59 | Admitting: Pulmonary Disease

## 2014-07-16 ENCOUNTER — Encounter: Payer: Self-pay | Admitting: Pulmonary Disease

## 2014-07-16 VITALS — BP 133/69 | HR 80 | Temp 97.8°F | Ht 68.0 in | Wt 205.0 lb

## 2014-07-16 DIAGNOSIS — G4733 Obstructive sleep apnea (adult) (pediatric): Secondary | ICD-10-CM

## 2014-07-16 NOTE — Assessment & Plan Note (Signed)
Gabriel Aguilar -We will get you new supplies  We will have them check if you qualify for a new machine  Weight loss encouraged, compliance with goal of at least 4-6 hrs every night is the expectation. Advised against medications with sedative side effects Cautioned against driving when sleepy - understanding that sleepiness will vary on a day to day basis

## 2014-07-16 NOTE — Patient Instructions (Signed)
Your supplier is [facility] We will get you new supplies  We will have them check if you qualify for a new machine

## 2014-07-16 NOTE — Progress Notes (Signed)
   Subjective:    Patient ID: Gabriel Aguilar, male    DOB: [birth year], 64 y.o.   MRN: [id number]  HPI  64 year old ex-smoker for FU of obstructive sleep apnea.  PSG 11/2001 showed AHI 20/h, predom apneas, longest 70 s, corrected by CPAP 8 cm He was set up by her ResMed S7 elite CPAP machine with nasal pillows, ramp Time of 20 minutes. 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 Follow-up    OSA: wears CPAP every night; needs new equipment.     Bedtime is around 11 PM, sleep latency is 20 minutes, he sleeps on his side with one pillow, he is one to 2 awakenings for nocturia and is out of bed by 7 AM with dryness of mouth. He denies headaches and feels rested during the day. He may have gained about 10 pounds in the last 10 years. He drinks 4-5 cups of coffee with his last cup being before noon  Compliant with CPPA Going on a cruise  Review of Systems neg for any significant sore throat, dysphagia, itching, sneezing, nasal congestion or excess/ purulent secretions, fever, chills, sweats, unintended wt loss, pleuritic or exertional cp, hempoptysis, orthopnea pnd or change in chronic leg swelling. Also denies presyncope, palpitations, heartburn, abdominal pain, nausea, vomiting, diarrhea or change in bowel or urinary habits, dysuria,hematuria, rash, arthralgias, visual complaints, headache, numbness weakness or ataxia.     Objective:   Physical Exam   Gen. Pleasant, well-nourished, in no distress ENT - no lesions, no post nasal drip Neck: No JVD, no thyromegaly, no carotid bruits Lungs: no use of accessory muscles, no dullness to percussion, clear without rales or rhonchi  Cardiovascular: Rhythm regular, heart sounds  normal, no murmurs or gallops, no peripheral edema Musculoskeletal: No deformities, no cyanosis or clubbing         Assessment & Plan:

## 2014-07-17 ENCOUNTER — Institutional Professional Consult (permissible substitution): Payer: 59 | Admitting: Pulmonary Disease

## 2014-07-17 ENCOUNTER — Other Ambulatory Visit: Payer: Self-pay | Admitting: *Deleted

## 2014-07-17 MED ORDER — ATORVASTATIN CALCIUM 40 MG PO TABS: 40.0000 mg | ORAL_TABLET | Freq: Every day | ORAL | Status: DC

## 2014-07-22 ENCOUNTER — Encounter: Payer: Self-pay | Admitting: *Deleted

## 2014-09-19 ENCOUNTER — Other Ambulatory Visit: Payer: Self-pay | Admitting: Family Medicine

## 2014-11-26 ENCOUNTER — Other Ambulatory Visit: Payer: Self-pay | Admitting: Family Medicine

## 2015-01-06 ENCOUNTER — Encounter: Payer: Self-pay | Admitting: Family Medicine

## 2015-01-27 ENCOUNTER — Ambulatory Visit (INDEPENDENT_AMBULATORY_CARE_PROVIDER_SITE_OTHER): Payer: 59 | Admitting: Family Medicine

## 2015-01-27 ENCOUNTER — Encounter: Payer: Self-pay | Admitting: Family Medicine

## 2015-01-27 VITALS — BP 124/67 | HR 65 | Temp 98.8°F | Ht 68.0 in | Wt 215.0 lb

## 2015-01-27 DIAGNOSIS — E785 Hyperlipidemia, unspecified: Secondary | ICD-10-CM | POA: Diagnosis not present

## 2015-01-27 DIAGNOSIS — Z0000 Encounter for general adult medical examination without abnormal findings: Secondary | ICD-10-CM | POA: Diagnosis not present

## 2015-01-27 MED ORDER — OMEPRAZOLE 40 MG PO CPDR: 40.0000 mg | DELAYED_RELEASE_CAPSULE | Freq: Every day | ORAL | Status: DC

## 2015-01-27 MED ORDER — ATORVASTATIN CALCIUM 40 MG PO TABS: ORAL_TABLET | ORAL | Status: DC

## 2015-01-27 NOTE — Progress Notes (Signed)
Pre visit review using our clinic review tool, if applicable. No additional management support is needed unless otherwise documented below in the visit note. 

## 2015-01-27 NOTE — Progress Notes (Signed)
   Subjective:    Patient ID: Gabriel Aguilar, male    DOB: 09/26/1950, 64 y.o.   MRN: [id number]  HPI 64 yr old male for a cpx. He feels well. He works out at a gym 3 days a week and walks the other days.    Review of Systems  Constitutional: Negative.   HENT: Negative.   Eyes: Negative.   Respiratory: Negative.   Cardiovascular: Negative.   Gastrointestinal: Negative.   Genitourinary: Negative.   Musculoskeletal: Negative.   Skin: Negative.   Neurological: Negative.   Psychiatric/Behavioral: Negative.        Objective:   Physical Exam  Constitutional: He is oriented to person, place, and time. He appears well-developed and well-nourished. No distress.  HENT:  Head: Normocephalic and atraumatic.  Right Ear: External ear normal.  Left Ear: External ear normal.  Nose: Nose normal.  Mouth/Throat: Oropharynx is clear and moist. No oropharyngeal exudate.  Eyes: Conjunctivae and EOM are normal. Pupils are equal, round, and reactive to light. Right eye exhibits no discharge. Left eye exhibits no discharge. No scleral icterus.  Neck: Neck supple. No JVD present. No tracheal deviation present. No thyromegaly present.  Cardiovascular: Normal rate, regular rhythm, normal heart sounds and intact distal pulses.  Exam reveals no gallop and no friction rub.   No murmur heard. EKG normal  Pulmonary/Chest: Effort normal and breath sounds normal. No respiratory distress. He has no wheezes. He has no rales. He exhibits no tenderness.  Abdominal: Soft. Bowel sounds are normal. He exhibits no distension and no mass. There is no tenderness. There is no rebound and no guarding.  Genitourinary: Rectum normal and penis normal. Guaiac negative stool. No penile tenderness.  Prostate is moderately enlarged but not nodular   Musculoskeletal: Normal range of motion. He exhibits no edema or tenderness.  Lymphadenopathy:    He has no cervical adenopathy.  Neurological: He is alert and oriented to person,  place, and time. He has normal reflexes. No cranial nerve deficit. He exhibits normal muscle tone. Coordination normal.  Skin: Skin is warm and dry. No rash noted. He is not diaphoretic. No erythema. No pallor.  Psychiatric: He has a normal mood and affect. His behavior is normal. Judgment and thought content normal.          Assessment & Plan:  Well exam. Set up fasting labs. We discussed diet and exercise advice.

## 2015-03-19 ENCOUNTER — Other Ambulatory Visit (INDEPENDENT_AMBULATORY_CARE_PROVIDER_SITE_OTHER): Payer: 59

## 2015-03-19 DIAGNOSIS — Z0000 Encounter for general adult medical examination without abnormal findings: Secondary | ICD-10-CM

## 2015-03-19 DIAGNOSIS — Z125 Encounter for screening for malignant neoplasm of prostate: Secondary | ICD-10-CM

## 2015-03-19 LAB — LIPID PANEL
CHOL/HDL RATIO: 4
Cholesterol: 166 mg/dL (ref 0–200)
HDL: 44.3 mg/dL (ref >39.00)
LDL Cholesterol: 90 mg/dL (ref 0–99)
NONHDL: 121.5
Triglycerides: 157 mg/dL — ABNORMAL HIGH (ref 0.0–149.0)
VLDL: 31.4 mg/dL (ref 0.0–40.0)

## 2015-03-19 LAB — HEPATIC FUNCTION PANEL
ALBUMIN: 4.2 g/dL (ref 3.5–5.2)
ALK PHOS: 56 U/L (ref 39–117)
ALT: 38 U/L (ref 0–53)
AST: 30 U/L (ref 0–37)
Bilirubin, Direct: 0.1 mg/dL (ref 0.0–0.3)
TOTAL PROTEIN: 6.4 g/dL (ref 6.0–8.3)
Total Bilirubin: 0.6 mg/dL (ref 0.2–1.2)

## 2015-03-19 LAB — BASIC METABOLIC PANEL
BUN: 20 mg/dL (ref 6–23)
CO2: 27 meq/L (ref 19–32)
Calcium: 9.4 mg/dL (ref 8.4–10.5)
Chloride: 103 mEq/L (ref 96–112)
Creatinine, Ser: 1.25 mg/dL (ref 0.40–1.50)
GFR: 61.36 mL/min (ref >60.00)
GLUCOSE: 93 mg/dL (ref 70–99)
POTASSIUM: 4.6 meq/L (ref 3.5–5.1)
SODIUM: 137 meq/L (ref 135–145)

## 2015-03-19 LAB — CBC WITH DIFFERENTIAL/PLATELET
BASOS PCT: 0.5 % (ref 0.0–3.0)
Basophils Absolute: 0.1 10*3/uL (ref 0.0–0.1)
EOS ABS: 0.6 10*3/uL (ref 0.0–0.7)
EOS PCT: 3.9 % (ref 0.0–5.0)
HCT: 41.6 % (ref 39.0–52.0)
Hemoglobin: 14.2 g/dL (ref 13.0–17.0)
LYMPHS ABS: 5.6 10*3/uL — AB (ref 0.7–4.0)
Lymphocytes Relative: 39.7 % (ref 12.0–46.0)
MCHC: 34.1 g/dL (ref 30.0–36.0)
MCV: 90.5 fl (ref 78.0–100.0)
Monocytes Absolute: 1.2 10*3/uL — ABNORMAL HIGH (ref 0.1–1.0)
Monocytes Relative: 8.2 % (ref 3.0–12.0)
NEUTROS PCT: 47.7 % (ref 43.0–77.0)
Neutro Abs: 6.8 10*3/uL (ref 1.4–7.7)
PLATELETS: 347 10*3/uL (ref 150.0–400.0)
RBC: 4.6 Mil/uL (ref 4.22–5.81)
RDW: 13.8 % (ref 11.5–15.5)
WBC: 14.2 10*3/uL — ABNORMAL HIGH (ref 4.0–10.5)

## 2015-03-19 LAB — PSA: PSA: 1.61 ng/mL (ref 0.10–4.00)

## 2015-03-19 LAB — POCT URINALYSIS DIPSTICK
BILIRUBIN UA: NEGATIVE
Glucose, UA: NEGATIVE
KETONES UA: NEGATIVE
Leukocytes, UA: NEGATIVE
NITRITE UA: NEGATIVE
PH UA: 5.5
PROTEIN UA: NEGATIVE
Spec Grav, UA: 1.01
Urobilinogen, UA: 0.2

## 2015-03-19 LAB — TSH: TSH: 1.68 u[IU]/mL (ref 0.35–4.50)

## 2015-03-29 ENCOUNTER — Other Ambulatory Visit: Payer: Self-pay | Admitting: Family Medicine

## 2015-03-29 DIAGNOSIS — D72829 Elevated white blood cell count, unspecified: Secondary | ICD-10-CM

## 2015-05-05 ENCOUNTER — Other Ambulatory Visit (INDEPENDENT_AMBULATORY_CARE_PROVIDER_SITE_OTHER): Payer: 59

## 2015-05-05 DIAGNOSIS — D72829 Elevated white blood cell count, unspecified: Secondary | ICD-10-CM | POA: Diagnosis not present

## 2015-05-05 LAB — CBC WITH DIFFERENTIAL/PLATELET
BASOS PCT: 0.5 % (ref 0.0–3.0)
Basophils Absolute: 0.1 10*3/uL (ref 0.0–0.1)
Eosinophils Absolute: 0.5 10*3/uL (ref 0.0–0.7)
Eosinophils Relative: 3.2 % (ref 0.0–5.0)
HEMATOCRIT: 43.2 % (ref 39.0–52.0)
HEMOGLOBIN: 14.7 g/dL (ref 13.0–17.0)
LYMPHS PCT: 40.2 % (ref 12.0–46.0)
Lymphs Abs: 5.7 10*3/uL — ABNORMAL HIGH (ref 0.7–4.0)
MCHC: 34 g/dL (ref 30.0–36.0)
MCV: 90.9 fl (ref 78.0–100.0)
Monocytes Absolute: 1 10*3/uL (ref 0.1–1.0)
Monocytes Relative: 7 % (ref 3.0–12.0)
Neutro Abs: 6.9 10*3/uL (ref 1.4–7.7)
Neutrophils Relative %: 49.1 % (ref 43.0–77.0)
Platelets: 361 10*3/uL (ref 150.0–400.0)
RBC: 4.75 Mil/uL (ref 4.22–5.81)
RDW: 13.6 % (ref 11.5–15.5)
WBC: 14.1 10*3/uL — AB (ref 4.0–10.5)

## 2015-05-11 ENCOUNTER — Telehealth: Payer: Self-pay | Admitting: Family Medicine

## 2015-05-11 ENCOUNTER — Observation Stay (HOSPITAL_COMMUNITY)
Admission: EM | Admit: 2015-05-11 | Discharge: 2015-05-14 | Disposition: A | Discharge status: Home / Self Care | Payer: 59 | Attending: Internal Medicine | Admitting: Internal Medicine

## 2015-05-11 ENCOUNTER — Emergency Department (HOSPITAL_COMMUNITY): Payer: 59

## 2015-05-11 ENCOUNTER — Encounter (HOSPITAL_COMMUNITY): Payer: Self-pay

## 2015-05-11 DIAGNOSIS — G4733 Obstructive sleep apnea (adult) (pediatric): Secondary | ICD-10-CM | POA: Diagnosis present

## 2015-05-11 DIAGNOSIS — Z79899 Other long term (current) drug therapy: Secondary | ICD-10-CM | POA: Diagnosis not present

## 2015-05-11 DIAGNOSIS — E785 Hyperlipidemia, unspecified: Secondary | ICD-10-CM | POA: Diagnosis not present

## 2015-05-11 DIAGNOSIS — N529 Male erectile dysfunction, unspecified: Secondary | ICD-10-CM | POA: Diagnosis not present

## 2015-05-11 DIAGNOSIS — K449 Diaphragmatic hernia without obstruction or gangrene: Secondary | ICD-10-CM | POA: Insufficient documentation

## 2015-05-11 DIAGNOSIS — G4737 Central sleep apnea in conditions classified elsewhere: Secondary | ICD-10-CM | POA: Diagnosis not present

## 2015-05-11 DIAGNOSIS — Z87891 Personal history of nicotine dependence: Secondary | ICD-10-CM | POA: Insufficient documentation

## 2015-05-11 DIAGNOSIS — K219 Gastro-esophageal reflux disease without esophagitis: Secondary | ICD-10-CM | POA: Diagnosis present

## 2015-05-11 DIAGNOSIS — Z9981 Dependence on supplemental oxygen: Secondary | ICD-10-CM | POA: Diagnosis not present

## 2015-05-11 DIAGNOSIS — D72820 Lymphocytosis (symptomatic): Secondary | ICD-10-CM | POA: Diagnosis present

## 2015-05-11 DIAGNOSIS — M1990 Unspecified osteoarthritis, unspecified site: Secondary | ICD-10-CM | POA: Insufficient documentation

## 2015-05-11 DIAGNOSIS — Z7982 Long term (current) use of aspirin: Secondary | ICD-10-CM | POA: Diagnosis not present

## 2015-05-11 DIAGNOSIS — I441 Atrioventricular block, second degree: Secondary | ICD-10-CM | POA: Clinically undetermined

## 2015-05-11 DIAGNOSIS — I309 Acute pericarditis, unspecified: Secondary | ICD-10-CM | POA: Diagnosis present

## 2015-05-11 DIAGNOSIS — D72829 Elevated white blood cell count, unspecified: Secondary | ICD-10-CM

## 2015-05-11 DIAGNOSIS — R079 Chest pain, unspecified: Secondary | ICD-10-CM | POA: Diagnosis present

## 2015-05-11 DIAGNOSIS — I10 Essential (primary) hypertension: Secondary | ICD-10-CM | POA: Diagnosis not present

## 2015-05-11 DIAGNOSIS — Z905 Acquired absence of kidney: Secondary | ICD-10-CM

## 2015-05-11 DIAGNOSIS — R0600 Dyspnea, unspecified: Secondary | ICD-10-CM

## 2015-05-11 DIAGNOSIS — I480 Paroxysmal atrial fibrillation: Secondary | ICD-10-CM | POA: Clinically undetermined

## 2015-05-11 HISTORY — DX: Palpitations: R00.2

## 2015-05-11 LAB — CBC
HCT: 41.6 % (ref 39.0–52.0)
HEMOGLOBIN: 14.2 g/dL (ref 13.0–17.0)
MCH: 30.7 pg (ref 26.0–34.0)
MCHC: 34.1 g/dL (ref 30.0–36.0)
MCV: 89.8 fL (ref 78.0–100.0)
PLATELETS: 311 10*3/uL (ref 150–400)
RBC: 4.63 MIL/uL (ref 4.22–5.81)
RDW: 13.3 % (ref 11.5–15.5)
WBC: 15.9 10*3/uL — AB (ref 4.0–10.5)

## 2015-05-11 LAB — COMPREHENSIVE METABOLIC PANEL
ALK PHOS: 63 U/L (ref 38–126)
ALT: 36 U/L (ref 17–63)
AST: 32 U/L (ref 15–41)
Albumin: 4.2 g/dL (ref 3.5–5.0)
Anion gap: 10 (ref 5–15)
BUN: 19 mg/dL (ref 6–20)
CALCIUM: 9.6 mg/dL (ref 8.9–10.3)
CHLORIDE: 106 mmol/L (ref 101–111)
CO2: 22 mmol/L (ref 22–32)
CREATININE: 1.15 mg/dL (ref 0.61–1.24)
GFR calc Af Amer: >60 mL/min (ref >60)
GFR calc non Af Amer: >60 mL/min (ref >60)
GLUCOSE: 101 mg/dL — AB (ref 65–99)
Potassium: 4.4 mmol/L (ref 3.5–5.1)
SODIUM: 138 mmol/L (ref 135–145)
Total Bilirubin: 0.7 mg/dL (ref 0.3–1.2)
Total Protein: 6.7 g/dL (ref 6.5–8.1)

## 2015-05-11 LAB — I-STAT TROPONIN, ED: TROPONIN I, POC: 0 ng/mL (ref 0.00–0.08)

## 2015-05-11 LAB — PROTIME-INR
INR: 0.96 (ref 0.00–1.49)
PROTHROMBIN TIME: 13 s (ref 11.6–15.2)

## 2015-05-11 LAB — TROPONIN I: Troponin I: <0.03 ng/mL (ref <0.031)

## 2015-05-11 LAB — MAGNESIUM: Magnesium: 1.8 mg/dL (ref 1.7–2.4)

## 2015-05-11 MED ORDER — ATORVASTATIN CALCIUM 40 MG PO TABS
40.0000 mg | ORAL_TABLET | Freq: Every day | ORAL | Status: DC
  Administered 2015-05-12 – 2015-05-14 (×3): 40 mg via ORAL
  Filled 2015-05-11 (×3): qty 1

## 2015-05-11 MED ORDER — MORPHINE SULFATE (PF) 2 MG/ML IV SOLN
2.0000 mg | INTRAVENOUS | Status: DC | PRN
  Administered 2015-05-12 (×2): 2 mg via INTRAVENOUS
  Filled 2015-05-11 (×2): qty 1

## 2015-05-11 MED ORDER — ASPIRIN 81 MG PO CHEW: 324.0000 mg | CHEWABLE_TABLET | Freq: Once | ORAL | Status: DC

## 2015-05-11 MED ORDER — ENOXAPARIN SODIUM 40 MG/0.4ML ~~LOC~~ SOLN
40.0000 mg | SUBCUTANEOUS | Status: DC
  Administered 2015-05-11 – 2015-05-12 (×2): 40 mg via SUBCUTANEOUS
  Filled 2015-05-11 (×3): qty 0.4

## 2015-05-11 MED ORDER — GI COCKTAIL ~~LOC~~
30.0000 mL | Freq: Four times a day (QID) | ORAL | Status: DC | PRN
  Administered 2015-05-12: 30 mL via ORAL
  Filled 2015-05-11: qty 30

## 2015-05-11 MED ORDER — METOPROLOL TARTRATE 1 MG/ML IV SOLN
INTRAVENOUS | Status: AC
  Filled 2015-05-11: qty 5

## 2015-05-11 MED ORDER — OMEGA-3-ACID ETHYL ESTERS 1 G PO CAPS
2.0000 g | ORAL_CAPSULE | Freq: Every day | ORAL | Status: DC
  Administered 2015-05-12 – 2015-05-14 (×3): 2 g via ORAL
  Filled 2015-05-11 (×4): qty 2

## 2015-05-11 MED ORDER — OMEGA 3 1000 MG PO CAPS: 2.0000 | ORAL_CAPSULE | Freq: Every day | ORAL | Status: DC

## 2015-05-11 MED ORDER — PANTOPRAZOLE SODIUM 40 MG PO TBEC
40.0000 mg | DELAYED_RELEASE_TABLET | Freq: Every day | ORAL | Status: DC
  Administered 2015-05-12 – 2015-05-14 (×3): 40 mg via ORAL
  Filled 2015-05-11 (×3): qty 1

## 2015-05-11 MED ORDER — ACETAMINOPHEN 325 MG PO TABS
650.0000 mg | ORAL_TABLET | ORAL | Status: DC | PRN
  Administered 2015-05-13: 650 mg via ORAL
  Filled 2015-05-11: qty 2

## 2015-05-11 MED ORDER — METOPROLOL TARTRATE 1 MG/ML IV SOLN
5.0000 mg | INTRAVENOUS | Status: DC | PRN
  Administered 2015-05-11: 5 mg via INTRAVENOUS

## 2015-05-11 MED ORDER — SODIUM CHLORIDE 0.9 % IV SOLN: INTRAVENOUS | Status: DC

## 2015-05-11 MED ORDER — ASPIRIN EC 81 MG PO TBEC
81.0000 mg | DELAYED_RELEASE_TABLET | Freq: Every day | ORAL | Status: DC
  Administered 2015-05-12 – 2015-05-14 (×3): 81 mg via ORAL
  Filled 2015-05-11 (×3): qty 1

## 2015-05-11 MED ORDER — ONDANSETRON HCL 4 MG/2ML IJ SOLN: 4.0000 mg | Freq: Four times a day (QID) | INTRAMUSCULAR | Status: DC | PRN

## 2015-05-11 MED ORDER — ADULT MULTIVITAMIN W/MINERALS CH
1.0000 | ORAL_TABLET | Freq: Every day | ORAL | Status: DC
  Administered 2015-05-12 – 2015-05-14 (×3): 1 via ORAL
  Filled 2015-05-11 (×3): qty 1

## 2015-05-11 MED ORDER — NITROGLYCERIN 0.4 MG SL SUBL
0.4000 mg | SUBLINGUAL_TABLET | SUBLINGUAL | Status: DC | PRN
  Administered 2015-05-11 – 2015-05-12 (×2): 0.4 mg via SUBLINGUAL
  Filled 2015-05-11: qty 1

## 2015-05-11 NOTE — Telephone Encounter (Signed)
Pt is currently admitted

## 2015-05-11 NOTE — H&P (Signed)
[facility] History and Physical  Gabriel Aguilar QQV:[id number] DOB: Dec 23, 1950 DOA: 05/11/2015  Referring physician: ED physician PCP: Laurey Morale, MD  Specialists:  Dr. Elsworth Soho, pulmonology  Chief Complaint:  Chest pain  HPI: Gabriel Aguilar is a 65 y.o. male with PMH of obstructive sleep apnea, hyperlipidemia, GERD, and recently elevated blood pressure being followed off treatment who presents to the ED with 1 day of chest pain at rest. Patient reports being in his usual state of health yesterday, but mid-morning on the day of admission, he experienced pain in the upper left chest while at rest. Pain was described as severe, achy, radiating to the left shoulder, worse with cough, with no alleviating factors identified. Since that time, patient has had several more similar episodes. The recurrent episodes featured pain in the central chest rather than upper left as initially experienced. He reports history of recurrent and similar pain on the right side of his chest that he attributes to gas and which resolves spontaneously after 1-2 hours. In the afternoon, Mr. Castell called his PCP office for advice. Patient took a nitroglycerin and full dose aspirin at home and experienced relief of his symptoms. At the direction of his PCP office, he came in the ED for evaluation. Of note, patient goes to the gym almost every morning and does not experience chest pain with this exertion. He was doing labor-intensive yardwork approximately 2 weeks ago and felt that he was becoming short of breath much more easily than before. There was no chest pain at that time and dyspnea recovered with rest. He was kept on an event monitor in the outpatient setting 2 years ago for intermittent palpitations, but there was no arrhythmia identified during the episodes of palpitations. He's never had a stress test or echo.  In ED, patient was found to  be afebrile, saturating well on room air, with vital signs stable, and  pain-free. EKG revealed a sinus rhythm with minimal ST change in the inferior leads. Troponin was undetectable initially. Basic blood work, including CBC and chemistry panel was notable for a leukocytosis to 15,900. Portable chest x-ray was obtained, negative for any acute cardiopulmonary disease. Patient remained stable in the emergency department and will be admitted for ongoing evaluation and management of chest pain.   Where does patient live?   At home     Can patient participate in ADLs?  Yes        Review of Systems:   General: no fevers, chills, sweats, weight change, poor appetite, or fatigue HEENT: no blurry vision, hearing changes or sore throat Pulm: no cough or wheeze. Exertional dyspnea.  CV: no palpitations. Left-sided CP.  Abd: no nausea, vomiting, abdominal pain, diarrhea, or constipation GU: no dysuria, hematuria, increased urinary frequency, or urgency  Ext: no leg edema Neuro: no focal weakness, numbness, or tingling, no vision change or hearing loss Skin: no rash, no wounds MSK: No muscle spasm, no deformity, no red, hot, or swollen joint Heme: No easy bruising or bleeding Travel history: No recent long distant travel    Allergy: No Known Allergies  Past Medical History  Diagnosis Date  . GERD (gastroesophageal reflux disease)   . Hiatal hernia   . Arthritis   . Hyperlipidemia   . Sleep apnea, central     wears a CPAP at night   . Hypogonadism male     has seen  Dr. Orland Mustard  . Erectile dysfunction     Past Surgical History  Procedure Laterality Date  .  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 Spine surgery  1994    disc at L4-L5  . Eye surgery      LASIK  . Vasectomy    . Colonoscopy  07-09-12    per Dr. Deatra Ina, adenomatous polyp, repeat in 5 yrs     Social History:  reports that he quit smoking about 32 years ago. He started smoking about 51 years ago. He has never used smokeless tobacco. He reports that he does  not drink alcohol or use illicit drugs.  Family History:  Family History  Problem Relation Age of Onset  . Hyperlipidemia Father   . Heart disease Father   . Hypertension Father   . Kidney disease Father   . Diabetes Father   . Colon cancer Neg Hx      Prior to Admission medications   Medication Sig Start Date End Date Taking? Authorizing Provider  aspirin EC 81 MG tablet Take 1 tablet (81 mg total) by mouth daily. 12/17/12  Yes Laurey Morale, MD  atorvastatin (LIPITOR) 40 MG tablet Take 1 tablet by mouth  daily 01/27/15  Yes Laurey Morale, MD  Misc. Devices KIT by Does not apply route. CPAP machine   Yes Historical Provider, MD  Multiple Vitamin (MULTI VITAMIN PO) Take 1 tablet by mouth daily.   Yes Historical Provider, MD  OMEGA 3 1000 MG CAPS Take 2 capsules by mouth daily.   Yes Historical Provider, MD  omeprazole (PRILOSEC) 40 MG capsule Take 1 capsule (40 mg total) by mouth daily. 01/27/15  Yes Laurey Morale, MD    Physical Exam: Filed Vitals:   05/11/15 1800 05/11/15 1815 05/11/15 1930 05/11/15 2016  BP: 164/89 158/87 163/90 181/83  Pulse: 79 77 81 78  Temp:    98.6 F (37 C)  TempSrc:    Oral  Resp: _0 Height:    _1  (1.727 m)  Weight:    97.206 kg (214 lb 4.8 oz)  SpO2: 99% 98% 97% 96%   General: Not in acute distress HEENT:       Eyes: PERRL, EOMI, no scleral icterus or conjunctival pallor.       ENT: No discharge from the ears or nose, no pharyngeal ulcers, petechiae or exudate, no tonsillar enlargement.        Neck: No JVD, no bruit, no appreciable mass Heme: No cervical adenopathy, no pallor Cardiac: S1/S2, RRR, No murmurs, No gallops or rubs. Pulm: Good air movement bilaterally. No rales, wheezing, rhonchi or rubs. Abd: Soft, nondistended, nontender, no rebound pain or gaurding, no mass or organomegaly, BS present. Ext: No LE edema bilaterally. 2+DP/PT pulse bilaterally. Musculoskeletal: No gross deformity, no red, hot, swollen joints   Skin: No  rashes or wounds on exposed surfaces  Neuro: Alert, oriented X3, cranial nerves II-XII grossly intact. No focal findings Psych: Patient is not overtly psychotic, appropriate mood and affect.  Labs on Admission:  Basic Metabolic Panel:  Recent Labs Lab 05/11/15 1613  NA 138  K 4.4  CL 106  CO2 22  GLUCOSE 101*  BUN 19  CREATININE 1.15  CALCIUM 9.6  MG 1.8   Liver Function Tests:  Recent Labs Lab 05/11/15 1613  AST 32  ALT 36  ALKPHOS 63  BILITOT 0.7  PROT 6.7  ALBUMIN 4.2   No results for input(s): LIPASE, AMYLASE in the last 168 hours. No results for input(s): AMMONIA  in the last 168 hours. CBC:  Recent Labs Lab 05/05/15 0857 05/11/15 1613  WBC 14.1* 15.9*  NEUTROABS 6.9  --   HGB 14.7 14.2  HCT 43.2 41.6  MCV 90.9 89.8  PLT 361.0 311   Cardiac Enzymes:  Recent Labs Lab 05/11/15 1613  TROPONINI <0.03    BNP (last 3 results) No results for input(s): BNP in the last 8760 hours.  ProBNP (last 3 results) No results for input(s): PROBNP in the last 8760 hours.  CBG: No results for input(s): GLUCAP in the last 168 hours.  Radiological Exams on Admission: Dg Chest 2 View  05/11/2015  CLINICAL DATA:  Chest pain since 0900 hours today former smoker, history GERD EXAM: CHEST  2 VIEW COMPARISON:  04/04/2013 FINDINGS: Normal heart size, mediastinal contours, and pulmonary vascularity. Atherosclerotic calcification aorta. Lungs clear. No pleural effusion or pneumothorax. Bones unremarkable. IMPRESSION: No acute abnormalities. Electronically Signed   By: Lavonia Dana M.D.   On: 05/11/2015 17:20    EKG: Independently reviewed.  Abnormal findings:  Sinus rhythm, borderline elevation of ST segment in inferior lead   Assessment/Plan  1. Chest pain  - Initial troponin negative  - Borderline ST changes inferiorly  - Received ASA 324 mg chew, metoprolol, and statin  - Monitor on tele - Obtain serial troponins  - Repeat EKG in am, sooner if pain recurs  - NTG  available prn CP   - Check lipids and A1c  2. OSA  - CPAP qHS    3. GERD  - Sxs different than presenting complaints  - Continue PPI with formulary equivalent   4. HLD  - Continue statin   5. HTN  - Not currently under treatment in outpt setting  - Gave 5 mg IVP metoprolol for HTN and CP - Monitoring on tele, will use beta-blocker or nitrates prn    6. Leukocytosis - Uncertain etiology, CXR without acute findings  - No localizing sxs  - Will monitor off abx  - Culture if febrile     DVT ppx:  SQ Lovenox    Code Status: Full code Family Communication:  Yes, patient's wife and son at bed side Disposition Plan: Admit to inpatient   Date of Service 05/11/2015    Vianne Bulls, MD [facility] Pager [phone]  If 7PM-7AM, please contact night-coverage [email or web address] Password [facility] 05/11/2015, 9:21 PM

## 2015-05-11 NOTE — ED Provider Notes (Signed)
CSN: [id number]     Arrival date & time 05/11/15  1557 History   First MD Initiated Contact with Patient 05/11/15 1559     Chief Complaint  Patient presents with  . Chest Pain     (Consider location/radiation/quality/duration/timing/severity/associated sxs/prior Treatment) HPI Patient presents after an episode of chest pain. Patient was in his usual state of health until about 8 hours ago, when he was at rest, felt left inferior lateral chest pain. The pain is sore, nonradiating. There was associated generalized warm sensation, discomfort. Pain did not seem worse with ambulation, but did seem worse with sitting down after walking. Pain improved with nitroglycerin, aspirin, provided by EMS providers. Currently the patient has no symptoms. He denies history of similar episodes, prior MI. He does over the past week he has had exertional fatigue, which is unusual for him. No recent medication changes, diet change, activity changes. He has family history of coronary disease, is not a smoker.  Past Medical History  Diagnosis Date  . GERD (gastroesophageal reflux disease)   . Hiatal hernia   . Arthritis   . Hyperlipidemia   . Sleep apnea, central     wears a CPAP at night   . Hypogonadism male     has seen  Dr. Orland Mustard  . Erectile dysfunction    Past Surgical History  Procedure Laterality Date  . 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 Spine surgery  1994    disc at L4-L5  . Eye surgery      LASIK  . Vasectomy    . Colonoscopy  07-09-12    per Dr. Deatra Ina, adenomatous polyp, repeat in 5 yrs    Family History  Problem Relation Age of Onset  . Hyperlipidemia Father   . Heart disease Father   . Hypertension Father   . Kidney disease Father   . Diabetes Father   . Colon cancer Neg Hx    Social History  Substance Use Topics  . Smoking status: Former Smoker -- 1.50 packs/day    Start date: 04/10/1964    Quit date: 03/06/1983  .  Smokeless tobacco: Never Used  . Alcohol Use: No     Comment: rare    Review of Systems  Constitutional:       Per HPI, otherwise negative  HENT:       Per HPI, otherwise negative  Respiratory:       Per HPI, otherwise negative  Cardiovascular:       Per HPI, otherwise negative  Gastrointestinal: Negative for vomiting.  Endocrine:       Negative aside from HPI  Genitourinary:       Neg aside from HPI   Musculoskeletal:       Per HPI, otherwise negative  Skin: Negative.   Neurological: Negative for syncope.      Allergies  Review of patient's allergies indicates no known allergies.  Home Medications   Prior to Admission medications   Medication Sig Start Date End Date Taking? Authorizing Provider  aspirin EC 81 MG tablet Take 1 tablet (81 mg total) by mouth daily. 12/17/12   Laurey Morale, MD  atorvastatin (LIPITOR) 40 MG tablet Take 1 tablet by mouth  daily 01/27/15   Laurey Morale, MD  OMEGA 3 1000 MG CAPS Take 2 capsules by mouth daily.    Historical Provider, MD  omeprazole (PRILOSEC) 40 MG capsule Take 1 capsule (40 mg total) by  mouth daily. 01/27/15   Laurey Morale, MD   BP 160/83 mmHg  Pulse 72  Temp(Src) 98.3 F (36.8 C) (Oral)  Resp 18  SpO2 100% Physical Exam  Constitutional: He is oriented to person, place, and time. He appears well-developed. No distress.  HENT:  Head: Normocephalic and atraumatic.  Eyes: Conjunctivae and EOM are normal.  Cardiovascular: Normal rate and regular rhythm.   Pulmonary/Chest: Effort normal. No stridor. No respiratory distress.  No chest wall tenderness to palpation, no deformities  Abdominal: He exhibits no distension.  Musculoskeletal: He exhibits no edema.  Neurological: He is alert and oriented to person, place, and time.  Skin: Skin is warm and dry.  Psychiatric: He has a normal mood and affect.  Nursing note and vitals reviewed.   ED Course  Procedures (including critical care time) Labs Review Labs Reviewed   CBC - Abnormal; Notable for the following:    WBC 15.9 (*)    All other components within normal limits  COMPREHENSIVE METABOLIC PANEL - Abnormal; Notable for the following:    Glucose, Bld 101 (*)    All other components within normal limits  MAGNESIUM  PROTIME-INR  TROPONIN I  I-STAT TROPOININ, ED    Imaging Review Dg Chest 2 View  05/11/2015  CLINICAL DATA:  Chest pain since 0900 hours today former smoker, history GERD EXAM: CHEST  2 VIEW COMPARISON:  04/04/2013 FINDINGS: Normal heart size, mediastinal contours, and pulmonary vascularity. Atherosclerotic calcification aorta. Lungs clear. No pleural effusion or pneumothorax. Bones unremarkable. IMPRESSION: No acute abnormalities. Electronically Signed   By: Lavonia Dana M.D.   On: 05/11/2015 17:20   I have personally reviewed and evaluated these images and lab results as part of my medical decision-making.   EKG Interpretation   Date/Time:  Tuesday May 11 2015 16:03:46 EST Ventricular Rate:  75 PR Interval:  190 QRS Duration: 97 QT Interval:  382 QTC Calculation: 427 R Axis:   19 Text Interpretation:  Sinus rhythm Minimal ST elevation, inferior leads  Sinus rhythm ST-t wave abnormality Abnormal ekg Confirmed by Carmin Muskrat  MD [id number]) on 05/11/2015 4:19:19 PM     I talked EMS providers on arrival. Patient's rhythm strip from ambulance had heart rate 80, artifact, mild T-wave abnormalities.  On cardiac monitor here, the patient is sinus rhythm, rate 80s, unremarkable Pulse oximetry is 100% room air normal   On repeat exam the patient is in no distress. He states that he has had brief episodes of similar pain during his monitoring thus far.  MDM  Patient was previously well presents with new inconsistent left chest pain. Here, the patient is awake, alert. His moderately hypertensive. Initial labs reassuring. No evidence for pneumonia, but the patient has had episodes of recurrent pain. EKG with nonspecific ST  changes. Patient has no recent heart evaluation, was admitted for further evaluation, management.  Carmin Muskrat, MD 05/11/15 [id number]

## 2015-05-11 NOTE — Telephone Encounter (Signed)
Patient Name: NOYAN TOLEDO  DOB: Feb 14, 1951    Initial Comment Caller states he has been having chest pain in the left side of his chest all day.   Nurse Assessment  Nurse: Julien Girt, RN, Almyra Free Date/Time Eilene Ghazi Time): 05/11/2015 2:49:34 PM  Confirm and document reason for call. If symptomatic, describe symptoms. You must click the next button to save text entered. ---Caller states he has been having chest pain in the left side of his chest all day. Pain is present now. has radiated in to his left shoulder.  Has the patient traveled out of the country within the last 30 days? ---Not Applicable  Does the patient have any new or worsening symptoms? ---Yes  Will a triage be completed? ---Yes  Related visit to physician within the last 2 weeks? ---No  Does the PT have any chronic conditions? (i.e. diabetes, asthma, etc.) ---Yes  List chronic conditions. ---High cholesterol, GERD  Is this a behavioral health or substance abuse call? ---No     Guidelines    Guideline Title Affirmed Question Affirmed Notes  Chest Pain [1] Chest pain lasts > 5 minutes AND [2] age > 26    Final Disposition User   Call EMS 911 Now Julien Girt, RN, Almyra Free    Disagree/Comply: Comply

## 2015-05-11 NOTE — ED Notes (Signed)
Per EMS - pt sudden onset left upper CP this morning around 0900 while working at desk at home. Pt felt hot all over, called PCP. Pt took 1 nitro and 324mg  aspirin PTA w/ complete pain relief (initial 8/10, upon arrival 0/10). Pt reports pain is worse with movement. Pt prev hx afib, NSR on monitor.

## 2015-05-12 ENCOUNTER — Observation Stay (HOSPITAL_COMMUNITY): Payer: 59

## 2015-05-12 ENCOUNTER — Encounter (HOSPITAL_COMMUNITY): Payer: Self-pay | Admitting: Physician Assistant

## 2015-05-12 ENCOUNTER — Observation Stay (HOSPITAL_BASED_OUTPATIENT_CLINIC_OR_DEPARTMENT_OTHER): Payer: 59

## 2015-05-12 DIAGNOSIS — R0789 Other chest pain: Secondary | ICD-10-CM | POA: Diagnosis not present

## 2015-05-12 DIAGNOSIS — I10 Essential (primary) hypertension: Secondary | ICD-10-CM | POA: Diagnosis not present

## 2015-05-12 DIAGNOSIS — K219 Gastro-esophageal reflux disease without esophagitis: Secondary | ICD-10-CM | POA: Diagnosis not present

## 2015-05-12 DIAGNOSIS — M1990 Unspecified osteoarthritis, unspecified site: Secondary | ICD-10-CM | POA: Diagnosis not present

## 2015-05-12 DIAGNOSIS — R079 Chest pain, unspecified: Secondary | ICD-10-CM | POA: Diagnosis not present

## 2015-05-12 DIAGNOSIS — Z905 Acquired absence of kidney: Secondary | ICD-10-CM | POA: Diagnosis not present

## 2015-05-12 DIAGNOSIS — I309 Acute pericarditis, unspecified: Secondary | ICD-10-CM | POA: Diagnosis not present

## 2015-05-12 DIAGNOSIS — E785 Hyperlipidemia, unspecified: Secondary | ICD-10-CM | POA: Diagnosis not present

## 2015-05-12 DIAGNOSIS — I400 Infective myocarditis: Secondary | ICD-10-CM

## 2015-05-12 DIAGNOSIS — G4733 Obstructive sleep apnea (adult) (pediatric): Secondary | ICD-10-CM | POA: Diagnosis not present

## 2015-05-12 DIAGNOSIS — D72829 Elevated white blood cell count, unspecified: Secondary | ICD-10-CM

## 2015-05-12 DIAGNOSIS — R071 Chest pain on breathing: Secondary | ICD-10-CM

## 2015-05-12 LAB — GLUCOSE, CAPILLARY: Glucose-Capillary: 98 mg/dL (ref 65–99)

## 2015-05-12 LAB — TROPONIN I

## 2015-05-12 LAB — SEDIMENTATION RATE: SED RATE: 16 mm/h (ref 0–16)

## 2015-05-12 MED ORDER — KETOROLAC TROMETHAMINE 30 MG/ML IJ SOLN
30.0000 mg | Freq: Once | INTRAMUSCULAR | Status: AC
  Administered 2015-05-12: 30 mg via INTRAVENOUS
  Filled 2015-05-12: qty 1

## 2015-05-12 MED ORDER — COLCHICINE 0.6 MG PO TABS
0.6000 mg | ORAL_TABLET | Freq: Two times a day (BID) | ORAL | Status: DC
  Administered 2015-05-12 – 2015-05-14 (×4): 0.6 mg via ORAL
  Filled 2015-05-12 (×5): qty 1

## 2015-05-12 MED ORDER — DILTIAZEM HCL 100 MG IV SOLR
5.0000 mg/h | INTRAVENOUS | Status: DC
  Administered 2015-05-12: 5 mg/h via INTRAVENOUS
  Filled 2015-05-12 (×2): qty 100

## 2015-05-12 MED ORDER — IOHEXOL 350 MG/ML SOLN
80.0000 mL | Freq: Once | INTRAVENOUS | Status: AC | PRN
  Administered 2015-05-12: 80 mL via INTRAVENOUS

## 2015-05-12 MED ORDER — IBUPROFEN 600 MG PO TABS
600.0000 mg | ORAL_TABLET | Freq: Three times a day (TID) | ORAL | Status: DC
  Administered 2015-05-12 – 2015-05-14 (×7): 600 mg via ORAL
  Filled 2015-05-12 (×7): qty 1

## 2015-05-12 MED ORDER — DILTIAZEM LOAD VIA INFUSION
20.0000 mg | Freq: Once | INTRAVENOUS | Status: AC
  Administered 2015-05-12: 20 mg via INTRAVENOUS
  Filled 2015-05-12: qty 20

## 2015-05-12 NOTE — Progress Notes (Signed)
Pt converted to NSR, heart rate 70-80's. Cardiologist was notified and he gave order to obtain an EKG and keep patient on Cardizem drip until morning, call MD if heart rate slows. Will continue to monitor closely.  Ferdinand Lango, RN

## 2015-05-12 NOTE — Progress Notes (Addendum)
The patient went into Afib with RVR on tele.  EKG Showed afib at rate of 124bpm. ST elevation in lateral leads. Pending sed rate. Given Toradol x 1. Will get stat echo to r/o myocarditis.   Start Cardizem load 20mg  and infusion.    Echo showed  LV EF: 65% -  70%  ------------------------------------------------------------------- Indications:   Myocarditis - 422.91.  ------------------------------------------------------------------- History:  PMH:  Atrial fibrillation. Angina pectoris. Risk factors: Obstructive sleep apnea. Hypertension. Dyslipidemia.  ------------------------------------------------------------------- Study Conclusions  - Left ventricle: The cavity size was normal. Wall thickness was normal. Systolic function was vigorous. The estimated ejection fraction was in the range of 65% to 70%. Wall motion was normal; there were no regional wall motion abnormalities. The study is not technically sufficient to allow evaluation of LV diastolic function. - Right atrium: Central venous pressure (est): 15 mm Hg. - Inferior vena cava: The vessel was dilated. The respirophasic diameter changes were blunted (< 50%), consistent with elevated central venous pressure   - Will treat for possible pericarditis. Start colchicine and Ibuprofen. Hold anticoagulation for now --> to avoid hemorrhagic convention of possible pericarditis. Get EKG in AM to reevaluate ST changes. Will keep NPO for possible stress test vs cath.   Bhagat,Bhavinkumar, PAC  Agree. Suspect acute pericarditis causing ST elevation changes and contributing to AF with RVR. Will start iv Cardizem, cycle troponin.   Zale Marcotte A

## 2015-05-12 NOTE — Progress Notes (Signed)
Notified from central tele, pt appears to be in afib rates120-160s. Pt asymptomatic. Cardiology notified. Will obtain a 12-lead

## 2015-05-12 NOTE — Progress Notes (Signed)
Pt complained of chest pain 4/10 and received Nitroglycerin SL X1 , his BP dropped to 82/52 and he was symptomatic feeling  deezzy , diaphoretic and SOB. MD was paged and before bolus was given pt started to feel better and BP came up to 100/ 67. Pt has nomore chest pain at this time. Will continue to monitor pt.  Lowell Guitar

## 2015-05-12 NOTE — Progress Notes (Signed)
  [facility] PROGRESS NOTE  Rondy Oakman [id number] DOB: 12/23/1950 DOA: 05/11/2015 PCP: Laurey Morale, MD  Assessment/Plan:  Principal Problem:   Chest pain: describes central, pleuritic and positional, sharp today with dyspnea 8/10. Was left sided yesterday. Some reproducibility on exam. MI ruled out. Cardiology here seeing. No cough. Will check CTA chest for PE (or infiltrate, given leukocytoisis). Cardiac workup deferred to cardiology Active Problems:   OSA (obstructive sleep apnea)   GERD (gastroesophageal reflux disease)   Essential hypertension   Leukocytosis: no fever. See above.   Single kidney   Hyperlipidemia  HPI/Subjective: See above for CP. Morphine helps. Couldn't sleep all night. Problems with CPAP machine  Objective: Filed Vitals:   05/12/15 0600 05/12/15 0756  BP: 137/75 149/82  Pulse:  100  Temp:  98.5 F (36.9 C)  Resp: 18 17   No intake or output data in the 24 hours ending 05/12/15 0859 Filed Weights   05/11/15 2016 05/12/15 0500  Weight: 97.206 kg (214 lb 4.8 oz) 95.845 kg (211 lb 4.8 oz)    Exam:   General:  Uncomfortable. Getting morphine right now  Cardiovascular: RRR without MGR. Chest wall tender to palpation central  Respiratory: CTA without WRR  Abdomen: S, NT, ND  Ext: no CCE  Basic Metabolic Panel:  Recent Labs Lab 05/11/15 1613  NA 138  K 4.4  CL 106  CO2 22  GLUCOSE 101*  BUN 19  CREATININE 1.15  CALCIUM 9.6  MG 1.8   Liver Function Tests:  Recent Labs Lab 05/11/15 1613  AST 32  ALT 36  ALKPHOS 63  BILITOT 0.7  PROT 6.7  ALBUMIN 4.2   No results for input(s): LIPASE, AMYLASE in the last 168 hours. No results for input(s): AMMONIA in the last 168 hours. CBC:  Recent Labs Lab 05/11/15 1613  WBC 15.9*  HGB 14.2  HCT 41.6  MCV 89.8  PLT 311   Cardiac Enzymes:  Recent Labs Lab 05/11/15 1613 05/11/15 2116 05/12/15 0306  TROPONINI <0.03 <0.03 <0.03   BNP (last 3 results) No results  for input(s): BNP in the last 8760 hours.  ProBNP (last 3 results) No results for input(s): PROBNP in the last 8760 hours.  CBG:  Recent Labs Lab 05/12/15 0111  GLUCAP 98    No results found for this or any previous visit (from the past 240 hour(s)).   Studies: Dg Chest 2 View  05/11/2015  CLINICAL DATA:  Chest pain since 0900 hours today former smoker, history GERD EXAM: CHEST  2 VIEW COMPARISON:  04/04/2013 FINDINGS: Normal heart size, mediastinal contours, and pulmonary vascularity. Atherosclerotic calcification aorta. Lungs clear. No pleural effusion or pneumothorax. Bones unremarkable. IMPRESSION: No acute abnormalities. Electronically Signed   By: Lavonia Dana M.D.   On: 05/11/2015 17:20    Scheduled Meds: . aspirin  324 mg Oral Once  . aspirin EC  81 mg Oral Daily  . atorvastatin  40 mg Oral q1800  . enoxaparin (LOVENOX) injection  40 mg Subcutaneous Q24H  . multivitamin with minerals  1 tablet Oral Daily  . omega-3 acid ethyl esters  2 g Oral Daily  . pantoprazole  40 mg Oral Daily   Continuous Infusions:   Time spent: 25 minutes  [facility] [email or web address], password [facility] 05/12/2015, 8:59 AM

## 2015-05-12 NOTE — Plan of Care (Signed)
Problem: Cardiac: Goal: Ability to achieve and maintain adequate cardiopulmonary perfusion will improve Outcome: Progressing Pt currently still in Afib, cardizem going at 5, HR 95, BP 111/66. Pt started on Advil and  colchicine for CP. Now a 0 out of 10 pain.

## 2015-05-12 NOTE — Consult Note (Addendum)
CARDIOLOGY CONSULT NOTE   Patient ID: Gabriel Aguilar MRN: [id number] DOB/AGE: 67-22-1952 67 y.o.  Admit date: 05/11/2015  Primary Physician   Laurey Morale, MD Primary Cardiologist  New Reason for Consultation   Chest pain Requesting Physician Dr. Conley Canal  HPI: Gabriel Aguilar is a 65 y.o. male with a history of sleep apnea on CPAP, GERD. Hyperlipidemia, hiatal hernia, elevated blood pressure (not on any medication) who presented to [facility] 05/11/15 for evaluation of left-sided chest pain.  Patient had a left upper chest pain at rest yesterday around 9 AM. He described the pain as sharp and intermittently radiated to his left shoulder. The pain on and off lasted for approximately 6 hours. Movement and taking a deep breath makes it worse. He denies nausea, abdominal pain or vomiting. He called his primary care patient was recommended ED presentation for further evaluation. Recently was given sublingual nitroglycerin x 2 by EMS with improvement. However, his pain reoccurred. Recently during extensive yard work. Denies exertional chest pain or shortness of breath.   In ED, EKG shows minimal ST segment abnormalities in inferior lead, nonspecific. Troponin x 3 negative. WBC of 15.9. Chest x-ray clear. The patient continued to have chest pain and shortness of breath overnight with minimal improvement on sublingual nitroglycerin, IV morphine and GI cocktail. Dr. Conley Canal is planning to get CTA to rule out PE.   Patient had a history of palpitation approximately 2 years ago. Patient placed on event monitor for 2 weeks which showed no arrhythmia. No episode of palpitations since then.   Father has a polycystic kidney disease. The patient had a negative ultrasound of kidney. 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 One brother had a stroke at age 85. Another brother has a CABG at age 11. The brother also has a heart disease in his late 91s. Mother has a history of heart failure and pacemaker  placement.   The patient denies lower extremity edema, orthopnea, PND, syncope, melena or blood in his stool.   Past Medical History  Diagnosis Date  . GERD (gastroesophageal reflux disease)   . Hiatal hernia   . Arthritis   . Hyperlipidemia   . Sleep apnea, central     wears a CPAP at night   . Hypogonadism male     has seen  Dr. Orland Mustard  . Erectile dysfunction      Past Surgical History  Procedure Laterality Date  . 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 Spine surgery  1994    disc at L4-L5  . Eye surgery      LASIK  . Vasectomy    . Colonoscopy  07-09-12    per Dr. Deatra Ina, adenomatous polyp, repeat in 5 yrs     No Known Allergies  I have reviewed the patient's current medications . aspirin  324 mg Oral Once  . aspirin EC  81 mg Oral Daily  . atorvastatin  40 mg Oral q1800  . enoxaparin (LOVENOX) injection  40 mg Subcutaneous Q24H  . multivitamin with minerals  1 tablet Oral Daily  . omega-3 acid ethyl esters  2 g Oral Daily  . pantoprazole  40 mg Oral Daily     acetaminophen, gi cocktail, metoprolol, morphine injection, nitroGLYCERIN, ondansetron (ZOFRAN) IV  Prior to Admission medications   Medication Sig Start Date End Date Taking? Authorizing Provider  aspirin EC 81 MG tablet Take 1 tablet (81 mg total) by mouth  daily. 12/17/12  Yes Laurey Morale, MD  atorvastatin (LIPITOR) 40 MG tablet Take 1 tablet by mouth  daily 01/27/15  Yes Laurey Morale, MD  Misc. Devices KIT by Does not apply route. CPAP machine   Yes Historical Provider, MD  Multiple Vitamin (MULTI VITAMIN PO) Take 1 tablet by mouth daily.   Yes Historical Provider, MD  OMEGA 3 1000 MG CAPS Take 2 capsules by mouth daily.   Yes Historical Provider, MD  omeprazole (PRILOSEC) 40 MG capsule Take 1 capsule (40 mg total) by mouth daily. 01/27/15  Yes Laurey Morale, MD     Social History   Social History  . Marital Status: Married    Spouse Name: N/A  . Number of  Children: N/A  . Years of Education: N/A   Occupational History  . Not on file.   Social History Main Topics  . Smoking status: Former Smoker -- 1.50 packs/day    Start date: 04/10/1964    Quit date: 03/06/1983  . Smokeless tobacco: Never Used  . Alcohol Use: No     Comment: rare  . Drug Use: No  . Sexual Activity: Not on file   Other Topics Concern  . Not on file   Social History Narrative    No family status information on file.   Family History  Problem Relation Age of Onset  . Hyperlipidemia Father   . Heart disease Father   . Hypertension Father   . Kidney disease Father   . Diabetes Father   . Colon cancer Neg Hx       ROS:  Full 14 point review of systems complete and found to be negative unless listed above.  Physical Exam: Blood pressure 149/82, pulse 100, temperature 98.5 F (36.9 C), temperature source Oral, resp. rate 17, height '5\' 8"'$  (1.727 m), weight 211 lb 4.8 oz (95.845 kg), SpO2 97 %.  General: Well developed, well nourished, male in no acute distress Head: Eyes PERRLA, No xanthomas. Normocephalic and atraumatic, oropharynx without edema or exudate.  Lungs: Resp regular and unlabored, CTA. The pain is reproducible with palpation.  Heart: RRR no s3, s4, or murmurs..   Neck: No carotid bruits. No lymphadenopathy. No  JVD. Abdomen: Bowel sounds present, abdomen soft and non-tender without masses or hernias noted. Msk:  No spine or cva tenderness. No weakness, no joint deformities or effusions. Extremities: No clubbing, cyanosis or edema. DP/PT/Radials 2+ and equal bilaterally. Neuro: Alert and oriented X 3. No focal deficits noted. Psych:  Good affect, responds appropriately Skin: No rashes or lesions noted.  Labs:   Lab Results  Component Value Date   WBC 15.9* 05/11/2015   HGB 14.2 05/11/2015   HCT 41.6 05/11/2015   MCV 89.8 05/11/2015   PLT 311 05/11/2015    Recent Labs  05/11/15 1613  INR 0.96    Recent Labs Lab 05/11/15 1613  NA  138  K 4.4  CL 106  CO2 22  BUN 19  CREATININE 1.15  CALCIUM 9.6  PROT 6.7  BILITOT 0.7  ALKPHOS 63  ALT 36  AST 32  GLUCOSE 101*  ALBUMIN 4.2   MAGNESIUM  Date Value Ref Range Status  05/11/2015 1.8 1.7 - 2.4 mg/dL Final    Recent Labs  05/11/15 1613 05/11/15 2116 05/12/15 0306  TROPONINI <0.03 <0.03 <0.03    Recent Labs  05/11/15 1621  TROPIPOC 0.00   No results found for: PROBNP Lab Results  Component Value Date   CHOL  166 03/19/2015   HDL 44.30 03/19/2015   LDLCALC 90 03/19/2015   TRIG 157.0* 03/19/2015   No results found for: DDIMER No results found for: LIPASE, AMYLASE TSH  Date/Time Value Ref Range Status  03/19/2015 08:05 AM 1.68 0.35 - 4.50 uIU/mL Final    Echo: Pening  ECG:  Vent. rate 66 BPM PR interval 204 ms QRS duration 90 ms QT/QTc 392/410 ms P-R-T axes 48 -1 23  Radiology:  Dg Chest 2 View  05/11/2015  CLINICAL DATA:  Chest pain since 0900 hours today former smoker, history GERD EXAM: CHEST  2 VIEW COMPARISON:  04/04/2013 FINDINGS: Normal heart size, mediastinal contours, and pulmonary vascularity. Atherosclerotic calcification aorta. Lungs clear. No pleural effusion or pneumothorax. Bones unremarkable. IMPRESSION: No acute abnormalities. Electronically Signed   By: Lavonia Dana M.D.   On: 05/11/2015 17:20    ASSESSMENT AND PLAN:     1. Chest pain - Has both typical and atypical features. Left-sided chest pain radiated to his left shoulder. Minimally responsive to sublingual nitroglycerin, however, reoccurred. The pain is reproducible with palpation, deep breath and movement. He presented musculoskeletal. - Suspicious for PE. Plan to get CTA to rule out PE and for evaluation of leukocytosis.  - The patient's cardiac risk factors include untreated hypertension, hyperlipidemia and significant family history of heart disease. - Patient will benefit from ischemic evaluation. Inpatient versus outpatient. Will discuss with M.D.  2. HTN -  Untreated. Add regimen. Currently on lopressor '5mg'$  PRN.   3. Leucocytosis - No fever. As above  4. HL - 03/19/2015: Cholesterol 166; HDL 44.30; LDL Cholesterol 90; Triglycerides 157.0*; VLDL 31.4  - Continue statin  5. OSA (obstructive sleep apnea) - On CPAP    6. GERD (gastroesophageal reflux disease)   - Continue protonix    7. Single kidney - Watch renal function closely.   SignedLeanor Kail, [place] 05/12/2015, 9:07 AM Pager [phone]  Co-Sign MD  CT ANGIOGRAPHY CHEST WITH CONTRAST No evidence of pulmonary embolus.  6 mm nodule is noted in right lower lobe. If the patient is at high risk for bronchogenic carcinoma, follow-up chest CT at 6-12 months is recommended. If the patient is at low risk for bronchogenic carcinoma, follow-up chest CT at 12 months is recommended. This recommendation follows the consensus statement: Guidelines for Management of Small Pulmonary Nodules Detected on CT Scans: A Statement from the [place] as published in Radiology 2005;237:395-400.  Patient seen and examined. Agree with assessment and plan.  Mr. Silvernail is a very pleasant 65 year old Caucasian male who has a history of recent development of hypertension, obstructive sleep apnea on CPAP therapy, hyperlipidemia on statin therapy, and GERD.  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  Yesterday, he developed nonexertional, sharp chest discomfort which lingered all day.  Today he noted more pleuritic component to his discomfort.  He denies any exertional precipitation to chest pain and denies any recent change in exercise tolerance.  He is unaware of any fever chills or night sweats.  He was admitted to the hospital.  His ECG independently reviewed by me revealed normal sinus rhythm with borderline first-degree AV block without  any acute ST segment changes.  His physical examination revealed mild increased blood pressure 149/82, there is no JVD.  His  lungs were clear without wheezing.  He did not have chest wall discomfort.  His pain was described as sharp and made worse with deep breathing and leaning forward.  Bowel sounds  were positive.  He did not have hepatosplenomegaly.  There was no edema.  Negative Homans sign.  Laboratory was notable for leukocytosis at 15.9 without a leftward shift, but with only 49.1% neutrophils and increased lymphocytes at 40.2%.  Chest x-ray was unremarkable.  A CT and she'll did not reveal evidence for pulmonary embolism.  He was incidentally noted to have a 6 mm right lower lobe nodule.  There is remote history of tobacco use but he quit smoking 36 years ago and had smoked for approximately 15 years.  I do not feel that his chest pain is ischemic in etiology.  I will schedule an echo Doppler study to assess his pericardium as well as systolic and diastolic function and wall thickness.  Consider obtaining erythrocyte sedimentation rate.  Consider repeating the CBC.  His renal function is stable with his 1 remaining kidney.  Consider a short trial of Toradol for probable musculoskeletal etiology.   Troy Sine, MD, [facility]) 05/12/2015 1:18 PM

## 2015-05-12 NOTE — Care Management Obs Status (Signed)
MEDICARE OBSERVATION STATUS NOTIFICATION   Patient Details  Name: Gabriel Aguilar MRN: [id number] Date of Birth: 08-24-1950   Medicare Observation Status Notification Given:  Other (see comment)Pt is of medicare age however he works and medicare Observation Letter ot provided.     Bethena Roys, RN 05/12/2015, 12:06 PM

## 2015-05-12 NOTE — Care Management Note (Signed)
Case Management Note  Patient Details  Name: Coehn Banner MRN: [id number] Date of Birth: 01/22/1951  Subjective/Objective:  Pt admitted for chest pain. Pt is from home and works from home when he began to have chest pain.                   Action/Plan: No needs identified by CM at this time. Will continue to monitor.    Expected Discharge Date:                  Expected Discharge Plan:  Home/Self Care  In-House Referral:  NA  Discharge planning Services  CM Consult  Post Acute Care Choice:  NA Choice offered to:  NA  DME Arranged:  N/A DME Agency:  NA  HH Arranged:  NA HH Agency:  NA  Status of Service:  Completed, signed off  Medicare Important Message Given:    Date Medicare IM Given:    Medicare IM give by:    Date Additional Medicare IM Given:    Additional Medicare Important Message give by:     If discussed at [place] of Stay Meetings, dates discussed:    Additional Comments:  Bethena Roys, RN 05/12/2015, 12:08 PM

## 2015-05-12 NOTE — Progress Notes (Signed)
  Echocardiogram 2D Echocardiogram has been performed.  Gabriel Aguilar 05/12/2015, 4:47 PM

## 2015-05-13 DIAGNOSIS — I480 Paroxysmal atrial fibrillation: Secondary | ICD-10-CM | POA: Clinically undetermined

## 2015-05-13 DIAGNOSIS — E785 Hyperlipidemia, unspecified: Secondary | ICD-10-CM | POA: Diagnosis not present

## 2015-05-13 DIAGNOSIS — Z905 Acquired absence of kidney: Secondary | ICD-10-CM | POA: Diagnosis not present

## 2015-05-13 DIAGNOSIS — R079 Chest pain, unspecified: Secondary | ICD-10-CM | POA: Diagnosis not present

## 2015-05-13 DIAGNOSIS — M1990 Unspecified osteoarthritis, unspecified site: Secondary | ICD-10-CM | POA: Diagnosis not present

## 2015-05-13 DIAGNOSIS — K219 Gastro-esophageal reflux disease without esophagitis: Secondary | ICD-10-CM | POA: Diagnosis not present

## 2015-05-13 DIAGNOSIS — I309 Acute pericarditis, unspecified: Secondary | ICD-10-CM | POA: Diagnosis not present

## 2015-05-13 DIAGNOSIS — R071 Chest pain on breathing: Secondary | ICD-10-CM | POA: Diagnosis not present

## 2015-05-13 DIAGNOSIS — G4733 Obstructive sleep apnea (adult) (pediatric): Secondary | ICD-10-CM | POA: Diagnosis not present

## 2015-05-13 DIAGNOSIS — I10 Essential (primary) hypertension: Secondary | ICD-10-CM | POA: Diagnosis not present

## 2015-05-13 DIAGNOSIS — I441 Atrioventricular block, second degree: Secondary | ICD-10-CM | POA: Clinically undetermined

## 2015-05-13 LAB — CBC WITH DIFFERENTIAL/PLATELET
BASOS ABS: 0 10*3/uL (ref 0.0–0.1)
Basophils Relative: 0 %
EOS PCT: 1 %
Eosinophils Absolute: 0.2 10*3/uL (ref 0.0–0.7)
HEMATOCRIT: 40.6 % (ref 39.0–52.0)
Hemoglobin: 14.1 g/dL (ref 13.0–17.0)
LYMPHS ABS: 4.9 10*3/uL — AB (ref 0.7–4.0)
LYMPHS PCT: 35 %
MCH: 31.5 pg (ref 26.0–34.0)
MCHC: 34.7 g/dL (ref 30.0–36.0)
MCV: 90.6 fL (ref 78.0–100.0)
Monocytes Absolute: 1.6 10*3/uL — ABNORMAL HIGH (ref 0.1–1.0)
Monocytes Relative: 12 %
NEUTROS ABS: 7.2 10*3/uL (ref 1.7–7.7)
Neutrophils Relative %: 52 %
PLATELETS: 288 10*3/uL (ref 150–400)
RBC: 4.48 MIL/uL (ref 4.22–5.81)
RDW: 13.6 % (ref 11.5–15.5)
WBC: 13.9 10*3/uL — AB (ref 4.0–10.5)

## 2015-05-13 LAB — SEDIMENTATION RATE: Sed Rate: 32 mm/hr — ABNORMAL HIGH (ref 0–16)

## 2015-05-13 MED ORDER — METOPROLOL TARTRATE 12.5 MG HALF TABLET
12.5000 mg | ORAL_TABLET | Freq: Two times a day (BID) | ORAL | Status: DC
  Administered 2015-05-13 – 2015-05-14 (×2): 12.5 mg via ORAL
  Filled 2015-05-13 (×2): qty 1

## 2015-05-13 MED ORDER — DILTIAZEM HCL 30 MG PO TABS: 30.0000 mg | ORAL_TABLET | Freq: Four times a day (QID) | ORAL | Status: DC

## 2015-05-13 NOTE — Progress Notes (Signed)
Patient Name: Gabriel Aguilar Date of Encounter: 05/13/2015   SUBJECTIVE  Feeling well. No chest pain, sob or palpitations.   CURRENT MEDS . aspirin  324 mg Oral Once  . aspirin EC  81 mg Oral Daily  . atorvastatin  40 mg Oral q1800  . colchicine  0.6 mg Oral BID  . enoxaparin (LOVENOX) injection  40 mg Subcutaneous Q24H  . ibuprofen  600 mg Oral TID  . multivitamin with minerals  1 tablet Oral Daily  . omega-3 acid ethyl esters  2 g Oral Daily  . pantoprazole  40 mg Oral Daily    OBJECTIVE  Filed Vitals:   05/12/15 1915 05/12/15 2305 05/13/15 0438 05/13/15 0727  BP: 109/82 134/56 104/57 109/62  Pulse: 104 67 57   Temp: 98.3 F (36.8 C) 97.5 F (36.4 C) 98.2 F (36.8 C)   TempSrc: Oral Oral Oral   Resp: '20 18 23 16  '$ Height:      Weight:   212 lb 4.9 oz (96.3 kg)   SpO2: 96% 98% 97%     Intake/Output Summary (Last 24 hours) at 05/13/15 0744 Last data filed at 05/12/15 1943  Gross per 24 hour  Intake 1429.92 ml  Output      0 ml  Net 1429.92 ml   Filed Weights   05/11/15 2016 05/12/15 0500 05/13/15 0438  Weight: 214 lb 4.8 oz (97.206 kg) 211 lb 4.8 oz (95.845 kg) 212 lb 4.9 oz (96.3 kg)    PHYSICAL EXAM  General: Pleasant, NAD. Neuro: Alert and oriented X 3. Moves all extremities spontaneously. Psych: Normal affect. HEENT:  Normal  Neck: Supple without bruits or JVD. Lungs:  Resp regular and unlabored, CTA. Heart: RRR no s3, s4, or murmurs. Abdomen: Soft, non-tender, non-distended, BS + x 4.  Extremities: No clubbing, cyanosis or edema. DP/PT/Radials 2+ and equal bilaterally.  Accessory Clinical Findings  CBC  Recent Labs  05/11/15 1613 05/13/15 0230  WBC 15.9* 13.9*  NEUTROABS  --  7.2  HGB 14.2 14.1  HCT 41.6 40.6  MCV 89.8 90.6  PLT 311 814   Basic Metabolic Panel  Recent Labs  05/11/15 1613  NA 138  K 4.4  CL 106  CO2 22  GLUCOSE 101*  BUN 19  CREATININE 1.15  CALCIUM 9.6  MG 1.8   Liver Function Tests  Recent Labs  05/11/15 1613  AST 32  ALT 36  ALKPHOS 63  BILITOT 0.7  PROT 6.7  ALBUMIN 4.2   No results for input(s): LIPASE, AMYLASE in the last 72 hours. Cardiac Enzymes  Recent Labs  05/11/15 2116 05/12/15 0306 05/12/15 0840  TROPONINI <0.03 <0.03 <0.03    TELE  Sinus rhyhtm  Radiology/Studies  Dg Chest 2 View  05/11/2015  CLINICAL DATA:  Chest pain since 0900 hours today former smoker, history GERD EXAM: CHEST  2 VIEW COMPARISON:  04/04/2013 FINDINGS: Normal heart size, mediastinal contours, and pulmonary vascularity. Atherosclerotic calcification aorta. Lungs clear. No pleural effusion or pneumothorax. Bones unremarkable. IMPRESSION: No acute abnormalities. Electronically Signed   By: Lavonia Dana M.D.   On: 05/11/2015 17:20   Ct Angio Chest Pe W/cm &/or Wo Cm  05/12/2015  CLINICAL DATA:  Acute chest pain and shortness of breath. EXAM: CT ANGIOGRAPHY CHEST WITH CONTRAST TECHNIQUE: Multidetector CT imaging of the chest was performed using the standard protocol during bolus administration of intravenous contrast. Multiplanar CT image reconstructions and MIPs were obtained to evaluate the vascular anatomy. CONTRAST:  43m OMNIPAQUE IOHEXOL  350 MG/ML SOLN COMPARISON:  Chest radiograph May 11, 2015. FINDINGS: No pneumothorax or pleural effusion is noted. 6 mm nodule is noted in the right lung base, best seen on image number 98 of series 3. Probable mild focus of subsegmental atelectasis or scarring is seen inferiorly in the lingular segment of left upper lobe. There is no evidence of thoracic aortic dissection or aneurysm. Visualized portion of upper abdomen is unremarkable. No mediastinal mass or adenopathy is noted. No significant osseous abnormality is noted. Review of the MIP images confirms the above findings. IMPRESSION: No evidence of pulmonary embolus. 6 mm nodule is noted in right lower lobe. If the patient is at high risk for bronchogenic carcinoma, follow-up chest CT at 6-12 months is  recommended. If the patient is at low risk for bronchogenic carcinoma, follow-up chest CT at 12 months is recommended. This recommendation follows the consensus statement: Guidelines for Management of Small Pulmonary Nodules Detected on CT Scans: A Statement from the [place] as published in Radiology 2005;237:395-400. Electronically Signed   By: Marijo Conception, M.D.   On: 05/12/2015 11:31    ASSESSMENT AND PLAN  1. Chest pain - Likely acute pericarditis. CTA negative for PE. Echo showed LV ef of 65-70%, no wm abnormality, RA central venous pressure was 15 mm HG, technically difficult study to evaluate LV diastolic function.  - Repeat EKG consistant with ST elevated in inferior lateral leads--> likely from pericarditis rather than ACS. Elevated sed rate.  Troponin negative. Pain resolved on colchicine and ibuprofen. Will discuss further plan with MD. Keep NPO.  - The patient's cardiac risk factors include untreated hypertension, hyperlipidemia and significant family history of heart disease.  2. Afib with RVR - New onset. Converted to sinus rhythm overnight on Cardizem drip (given '20mg'$  of bolus), stopped this morning due to transient bradycardia to low 30s. Rate currently in 60-70s. Just noted 2nd degree HB. Likely continued effect for Cardizem. Will continue to monitor. IF rate goes above 100s will start short acting cardizem. Asymptomatic.  - CHADSVASCs score of 2 (age, HTN).  Will discuss with MD.   3. Leucocytosis - improving  4. Single kidney - Will get BMET today  5. HL - 03/19/2015: Cholesterol 166; HDL 44.30; LDL Cholesterol 90; Triglycerides 157.0*; VLDL 31.4  - Continue statin    OSA (obstructive sleep apnea)   GERD (gastroesophageal reflux disease)   Essential hypertension   Signed, Bhagat,Bhavinkumar PA-C Pager [phone]     Patient seen and examined. Agree with assessment and plan.  The patient today feels significantly improved.  Yesterday he had classic  pleuritic-like chest pain.  Later in the afternoon .  He had developed atrial fibrillation, new onset, associated with diffuse upsloping ST elevation diffusely consistent with pericardial type pattern.  He was started on IV Cardizem drip and over the night subtotally converted to normal sinus rhythm.  Troponins  have been negative.  He also was started on colchicine and ibuprofen.  Today he has complete resolution of his previous significant pleuritic chest pain.  His ECG continues to show pericarditis ST elevation.  ESR done yesterday was elevated at 32.  Following cardioversion while on Cardizem he developed transient second-degree Mobitz type I Wenkebach  block.  His Cardizem drip was discontinued.  Presently he is in normal sinus rhythm at 62 bpm with resolution of prior block.  I will later institute low dose beta blocker therapy orally ik HR remains stable.  With his acute pericarditis, and resolution of atrial  fibrillation back into sinus rhythm of <12 hrs duration, I would not institute anticoagulation therapy due to risk of potential hemorrhagic pericarditis and precipitant cause for his A. Fib.  We will continue colchicine and ibuprofen presently.  Recommend follow-up ECG to assess evolutionary changes.   Troy Sine, MD, [facility] 05/13/2015 9:12 AM

## 2015-05-13 NOTE — Progress Notes (Signed)
[facility] PROGRESS NOTE  Gabriel Aguilar [id number] DOB: 1950/06/17 DOA: 05/11/2015 PCP: Laurey Morale, MD  Assessment/Plan:  Principal Problem: Acute pericarditis: improved on nsaids, colchicine. Active Problems: Transient a fib with RVR. Briefly on cardizem gtt, then became bradycardic. Cardiology to start low dose BB if HR ok.   OSA (obstructive sleep apnea)   GERD (gastroesophageal reflux disease)   Essential hypertension   Leukocytosis: no fever\   Single kidney   Hyperlipidemia  HPI/Subjective: CP resolved. Feels palpitations periodically PTA and last night  Objective: Filed Vitals:   05/13/15 0438 05/13/15 0727  BP: 104/57 109/62  Pulse: 57   Temp: 98.2 F (36.8 C)   Resp: 23 16    Intake/Output Summary (Last 24 hours) at 05/13/15 1307 Last data filed at 05/13/15 1100  Gross per 24 hour  Intake 7157.67 ml  Output      0 ml  Net 7157.67 ml   Filed Weights   05/11/15 2016 05/12/15 0500 05/13/15 0438  Weight: 97.206 kg (214 lb 4.8 oz) 95.845 kg (211 lb 4.8 oz) 96.3 kg (212 lb 4.9 oz)    Exam:   General:  Comfortable. A and o  Cardiovascular: RRR without MGR.   Respiratory: CTA without WRR  Abdomen: S, NT, ND  Ext: no CCE  Basic Metabolic Panel:  Recent Labs Lab 05/11/15 1613  NA 138  K 4.4  CL 106  CO2 22  GLUCOSE 101*  BUN 19  CREATININE 1.15  CALCIUM 9.6  MG 1.8   Liver Function Tests:  Recent Labs Lab 05/11/15 1613  AST 32  ALT 36  ALKPHOS 63  BILITOT 0.7  PROT 6.7  ALBUMIN 4.2   No results for input(s): LIPASE, AMYLASE in the last 168 hours. No results for input(s): AMMONIA in the last 168 hours. CBC:  Recent Labs Lab 05/11/15 1613 05/13/15 0230  WBC 15.9* 13.9*  NEUTROABS  --  7.2  HGB 14.2 14.1  HCT 41.6 40.6  MCV 89.8 90.6  PLT 311 288   Cardiac Enzymes:  Recent Labs Lab 05/11/15 1613 05/11/15 2116 05/12/15 0306 05/12/15 0840  TROPONINI <0.03 <0.03 <0.03 <0.03   BNP (last 3 results) No  results for input(s): BNP in the last 8760 hours.  ProBNP (last 3 results) No results for input(s): PROBNP in the last 8760 hours.  CBG:  Recent Labs Lab 05/12/15 0111  GLUCAP 98    No results found for this or any previous visit (from the past 240 hour(s)).   Studies: Dg Chest 2 View  05/11/2015  CLINICAL DATA:  Chest pain since 0900 hours today former smoker, history GERD EXAM: CHEST  2 VIEW COMPARISON:  04/04/2013 FINDINGS: Normal heart size, mediastinal contours, and pulmonary vascularity. Atherosclerotic calcification aorta. Lungs clear. No pleural effusion or pneumothorax. Bones unremarkable. IMPRESSION: No acute abnormalities. Electronically Signed   By: Lavonia Dana M.D.   On: 05/11/2015 17:20   Ct Angio Chest Pe W/cm &/or Wo Cm  05/12/2015  CLINICAL DATA:  Acute chest pain and shortness of breath. EXAM: CT ANGIOGRAPHY CHEST WITH CONTRAST TECHNIQUE: Multidetector CT imaging of the chest was performed using the standard protocol during bolus administration of intravenous contrast. Multiplanar CT image reconstructions and MIPs were obtained to evaluate the vascular anatomy. CONTRAST:  4mL OMNIPAQUE IOHEXOL 350 MG/ML SOLN COMPARISON:  Chest radiograph May 11, 2015. FINDINGS: No pneumothorax or pleural effusion is noted. 6 mm nodule is noted in the right lung base, best seen on image number 98 of series 3.  Probable mild focus of subsegmental atelectasis or scarring is seen inferiorly in the lingular segment of left upper lobe. There is no evidence of thoracic aortic dissection or aneurysm. Visualized portion of upper abdomen is unremarkable. No mediastinal mass or adenopathy is noted. No significant osseous abnormality is noted. Review of the MIP images confirms the above findings. IMPRESSION: No evidence of pulmonary embolus. 6 mm nodule is noted in right lower lobe. If the patient is at high risk for bronchogenic carcinoma, follow-up chest CT at 6-12 months is recommended. If the patient  is at low risk for bronchogenic carcinoma, follow-up chest CT at 12 months is recommended. This recommendation follows the consensus statement: Guidelines for Management of Small Pulmonary Nodules Detected on CT Scans: A Statement from the [place] as published in Radiology 2005;237:395-400. Electronically Signed   By: Marijo Conception, M.D.   On: 05/12/2015 11:31    Scheduled Meds: . aspirin  324 mg Oral Once  . aspirin EC  81 mg Oral Daily  . atorvastatin  40 mg Oral q1800  . colchicine  0.6 mg Oral BID  . enoxaparin (LOVENOX) injection  40 mg Subcutaneous Q24H  . ibuprofen  600 mg Oral TID  . multivitamin with minerals  1 tablet Oral Daily  . omega-3 acid ethyl esters  2 g Oral Daily  . pantoprazole  40 mg Oral Daily   Continuous Infusions:   Time spent: 25 minutes  [facility] [email or web address], password [facility] 05/13/2015, 1:07 PM

## 2015-05-13 NOTE — Progress Notes (Signed)
Pt. Using his own cpap from home. Pt. Wife states he does not need any assistance with it at this time.

## 2015-05-13 NOTE — Plan of Care (Signed)
Problem: Phase I Progression Outcomes Goal: Anginal pain relieved Outcome: Completed/Met Date Met:  05/13/15 Pt denies any chest pain.

## 2015-05-13 NOTE — Progress Notes (Signed)
cardizem stopped, HR 54, BP 104/54. MD paged.

## 2015-05-14 DIAGNOSIS — E785 Hyperlipidemia, unspecified: Secondary | ICD-10-CM | POA: Diagnosis not present

## 2015-05-14 DIAGNOSIS — I309 Acute pericarditis, unspecified: Secondary | ICD-10-CM | POA: Diagnosis not present

## 2015-05-14 DIAGNOSIS — Z905 Acquired absence of kidney: Secondary | ICD-10-CM | POA: Diagnosis not present

## 2015-05-14 DIAGNOSIS — K219 Gastro-esophageal reflux disease without esophagitis: Secondary | ICD-10-CM | POA: Diagnosis not present

## 2015-05-14 DIAGNOSIS — M1990 Unspecified osteoarthritis, unspecified site: Secondary | ICD-10-CM | POA: Diagnosis not present

## 2015-05-14 DIAGNOSIS — R079 Chest pain, unspecified: Secondary | ICD-10-CM | POA: Diagnosis not present

## 2015-05-14 LAB — BASIC METABOLIC PANEL
ANION GAP: 9 (ref 5–15)
BUN: 22 mg/dL — ABNORMAL HIGH (ref 6–20)
CHLORIDE: 104 mmol/L (ref 101–111)
CO2: 25 mmol/L (ref 22–32)
Calcium: 8.9 mg/dL (ref 8.9–10.3)
Creatinine, Ser: 1.17 mg/dL (ref 0.61–1.24)
GFR calc non Af Amer: >60 mL/min (ref >60)
GLUCOSE: 111 mg/dL — AB (ref 65–99)
Potassium: 4 mmol/L (ref 3.5–5.1)
Sodium: 138 mmol/L (ref 135–145)

## 2015-05-14 LAB — CBC
HEMATOCRIT: 40.2 % (ref 39.0–52.0)
HEMOGLOBIN: 13.6 g/dL (ref 13.0–17.0)
MCH: 31 pg (ref 26.0–34.0)
MCHC: 33.8 g/dL (ref 30.0–36.0)
MCV: 91.6 fL (ref 78.0–100.0)
Platelets: 313 10*3/uL (ref 150–400)
RBC: 4.39 MIL/uL (ref 4.22–5.81)
RDW: 13.6 % (ref 11.5–15.5)
WBC: 10.4 10*3/uL (ref 4.0–10.5)

## 2015-05-14 LAB — MAGNESIUM: Magnesium: 2.2 mg/dL (ref 1.7–2.4)

## 2015-05-14 MED ORDER — IBUPROFEN 600 MG PO TABS: 600.0000 mg | ORAL_TABLET | Freq: Three times a day (TID) | ORAL | Status: DC

## 2015-05-14 MED ORDER — METOPROLOL TARTRATE 25 MG PO TABS: 12.5000 mg | ORAL_TABLET | Freq: Two times a day (BID) | ORAL | Status: DC

## 2015-05-14 MED ORDER — COLCHICINE 0.6 MG PO TABS: 0.6000 mg | ORAL_TABLET | Freq: Two times a day (BID) | ORAL | Status: DC

## 2015-05-14 NOTE — Discharge Summary (Signed)
Physician Discharge Summary  Gabriel Aguilar NAT:[id number] DOB: 01/04/1951 DOA: 05/11/2015  PCP: Laurey Morale, MD  Admit date: 05/11/2015 Discharge date: 05/14/2015  Recommendations for Outpatient Follow-up:   Repeat CT chest 6-12 months: f/u subcentemeter lung nodule  Discharge Diagnoses:  Principal Problem:   Acute pericarditis Active Problems:   OSA (obstructive sleep apnea)   GERD (gastroesophageal reflux disease)   Essential hypertension   Leukocytosis   Single kidney   Hyperlipidemia   PAF (paroxysmal atrial fibrillation) (HCC)   Heart block AV second degree subcentementer lung nodule Transient atrial fibrillation  Discharge Condition: stable  Diet recommendation: heart healthy  Filed Weights   05/12/15 0500 05/13/15 0438 05/14/15 0325  Weight: 95.845 kg (211 lb 4.8 oz) 96.3 kg (212 lb 4.9 oz) 96.707 kg (213 lb 3.2 oz)    History of present illness:  65 y.o. male with PMH of obstructive sleep apnea, hyperlipidemia, GERD, and recently elevated blood pressure being followed off treatment who presents to the ED with 1 day of chest pain at rest. Patient reports being in his usual state of health yesterday, but mid-morning on the day of admission, he experienced pain in the upper left chest while at rest. Pain was described as severe, achy, radiating to the left shoulder, worse with cough, with no alleviating factors identified. Since that time, patient has had several more similar episodes. The recurrent episodes featured pain in the central chest rather than upper left as initially experienced. He reports history of recurrent and similar pain on the right side of his chest that he attributes to gas and which resolves spontaneously after 1-2 hours. In the afternoon, Mr. Siever called his PCP office for advice. Patient took a nitroglycerin and full dose aspirin at home and experienced relief of his symptoms. At the direction of his PCP office, he came in the ED for evaluation. Of  note, patient goes to the gym almost every morning and does not experience chest pain with this exertion. He was doing labor-intensive yardwork approximately 2 weeks ago and felt that he was becoming short of breath much more easily than before. There was no chest pain at that time and dyspnea recovered with rest. He was kept on an event monitor in the outpatient setting 2 years ago for intermittent palpitations, but there was no arrhythmia identified during the episodes of palpitations. He's never had a stress test or echo.  In ED, patient was found to be afebrile, saturating well on room air, with vital signs stable, and pain-free. EKG revealed a sinus rhythm with minimal ST change in the inferior leads. Troponin was undetectable initially. Basic blood work, including CBC and chemistry panel was notable for a leukocytosis to 15,900. Portable chest x-ray was obtained, negative for any acute cardiopulmonary disease. Patient remained stable in the emergency department and will be admitted for ongoing evaluation and management of chest pain.   Hospital Course:  Observed on telemetry. MI ruled out, but chest pain continued. EKG and symptoms consistent with acute pericarditis.  CTA chest negative for PE, showed incidental subcentimeter nodule, needs f/u CT chest 6-12 months. Started on NSAIDs and colchicine with resolution of symptoms. Echo with normal EF, no WMA, no effusion.  Developed transient atrial fibrillation, which may have been precipitated by pericarditis. CHADSVASC 2, but not started on anticoagulation due to short duration and risk ov potential hemorrhagic pericarditis.  Started on cardizem gtt which was stopped due to bradycardia and brief Wenkebach. Started on low dose metoprolol without further dysrhythmias.  Procedures:  none  Consultations:  [facility]  Discharge Exam: Filed Vitals:   05/13/15 1944 05/14/15 0325  BP: 110/58 117/56  Pulse: 66 65  Temp: 98.7 F (37.1 C) 98.2  F (36.8 C)  Resp: 17 17    General: a and o Cardiovascular: RRR Respiratory: CTA  Discharge Instructions   Discharge Instructions    Diet - low sodium heart healthy    Complete by:  As directed      Increase activity slowly    Complete by:  As directed           Current Discharge Medication List    START taking these medications   Details  colchicine 0.6 MG tablet Take 1 tablet (0.6 mg total) by mouth 2 (two) times daily. For 3 months Qty: 60 tablet, Refills: 2    ibuprofen (ADVIL,MOTRIN) 600 MG tablet Take 1 tablet (600 mg total) by mouth 3 (three) times daily. For 2 weeks Qty: 42 tablet, Refills: 0    metoprolol tartrate (LOPRESSOR) 25 MG tablet Take 0.5 tablets (12.5 mg total) by mouth 2 (two) times daily. Qty: 60 tablet, Refills: 0      CONTINUE these medications which have NOT CHANGED   Details  aspirin EC 81 MG tablet Take 1 tablet (81 mg total) by mouth daily. Qty: 1 tablet, Refills: 0    atorvastatin (LIPITOR) 40 MG tablet Take 1 tablet by mouth  daily Qty: 90 tablet, Refills: 3    Misc. Devices KIT by Does not apply route. CPAP machine    Multiple Vitamin (MULTI VITAMIN PO) Take 1 tablet by mouth daily.    OMEGA 3 1000 MG CAPS Take 2 capsules by mouth daily.    omeprazole (PRILOSEC) 40 MG capsule Take 1 capsule (40 mg total) by mouth daily. Qty: 90 capsule, Refills: 3       No Known Allergies    The results of significant diagnostics from this hospitalization (including imaging, microbiology, ancillary and laboratory) are listed below for reference.    Significant Diagnostic Studies: Dg Chest 2 View  05/11/2015  CLINICAL DATA:  Chest pain since 0900 hours today former smoker, history GERD EXAM: CHEST  2 VIEW COMPARISON:  04/04/2013 FINDINGS: Normal heart size, mediastinal contours, and pulmonary vascularity. Atherosclerotic calcification aorta. Lungs clear. No pleural effusion or pneumothorax. Bones unremarkable. IMPRESSION: No acute  abnormalities. Electronically Signed   By: Lavonia Dana M.D.   On: 05/11/2015 17:20   Ct Angio Chest Pe W/cm &/or Wo Cm  05/12/2015  CLINICAL DATA:  Acute chest pain and shortness of breath. EXAM: CT ANGIOGRAPHY CHEST WITH CONTRAST TECHNIQUE: Multidetector CT imaging of the chest was performed using the standard protocol during bolus administration of intravenous contrast. Multiplanar CT image reconstructions and MIPs were obtained to evaluate the vascular anatomy. CONTRAST:  71m OMNIPAQUE IOHEXOL 350 MG/ML SOLN COMPARISON:  Chest radiograph May 11, 2015. FINDINGS: No pneumothorax or pleural effusion is noted. 6 mm nodule is noted in the right lung base, best seen on image number 98 of series 3. Probable mild focus of subsegmental atelectasis or scarring is seen inferiorly in the lingular segment of left upper lobe. There is no evidence of thoracic aortic dissection or aneurysm. Visualized portion of upper abdomen is unremarkable. No mediastinal mass or adenopathy is noted. No significant osseous abnormality is noted. Review of the MIP images confirms the above findings. IMPRESSION: No evidence of pulmonary embolus. 6 mm nodule is noted in right lower lobe. If the patient is at  high risk for bronchogenic carcinoma, follow-up chest CT at 6-12 months is recommended. If the patient is at low risk for bronchogenic carcinoma, follow-up chest CT at 12 months is recommended. This recommendation follows the consensus statement: Guidelines for Management of Small Pulmonary Nodules Detected on CT Scans: A Statement from the [place] as published in Radiology 2005;237:395-400. Electronically Signed   By: Marijo Conception, M.D.   On: 05/12/2015 11:31   Echo Left ventricle: The cavity size was normal. Wall thickness was normal. Systolic function was vigorous. The estimated ejection fraction was in the range of 65% to 70%. Wall motion was normal; there were no regional wall motion abnormalities. The  study is not technically sufficient to allow evaluation of LV diastolic function. - Right atrium: Central venous pressure (est): 15 mm Hg. - Inferior vena cava: The vessel was dilated. The respirophasic diameter changes were blunted (< 50%), consistent with elevated central venous pressure.  Microbiology: No results found for this or any previous visit (from the past 240 hour(s)).   Labs: Basic Metabolic Panel:  Recent Labs Lab 05/11/15 1613 05/14/15 0414  NA 138 138  K 4.4 4.0  CL 106 104  CO2 22 25  GLUCOSE 101* 111*  BUN 19 22*  CREATININE 1.15 1.17  CALCIUM 9.6 8.9  MG 1.8 2.2   Liver Function Tests:  Recent Labs Lab 05/11/15 1613  AST 32  ALT 36  ALKPHOS 63  BILITOT 0.7  PROT 6.7  ALBUMIN 4.2   No results for input(s): LIPASE, AMYLASE in the last 168 hours. No results for input(s): AMMONIA in the last 168 hours. CBC:  Recent Labs Lab 05/11/15 1613 05/13/15 0230 05/14/15 0414  WBC 15.9* 13.9* 10.4  NEUTROABS  --  7.2  --   HGB 14.2 14.1 13.6  HCT 41.6 40.6 40.2  MCV 89.8 90.6 91.6  PLT 311 288 313   Cardiac Enzymes:  Recent Labs Lab 05/11/15 1613 05/11/15 2116 05/12/15 0306 05/12/15 0840  TROPONINI <0.03 <0.03 <0.03 <0.03   BNP: BNP (last 3 results) No results for input(s): BNP in the last 8760 hours.  ProBNP (last 3 results) No results for input(s): PROBNP in the last 8760 hours.  CBG:  Recent Labs Lab 05/12/15 0111  GLUCAP 98       Signed:  Delfina Redwood MD.  [facility] 05/14/2015, 1:58 PM

## 2015-05-14 NOTE — Progress Notes (Addendum)
Patient Name: Gabriel Aguilar Date of Encounter: 05/14/2015  Primary Cardiologist: Dr. Claiborne Billings   Principal Problem:   Acute pericarditis Active Problems:   OSA (obstructive sleep apnea)   GERD (gastroesophageal reflux disease)   Essential hypertension   Leukocytosis   Single kidney   Hyperlipidemia   PAF (paroxysmal atrial fibrillation) (HCC)   Heart block AV second degree    SUBJECTIVE  Denies any CP or SOB. Ambulating in the hallway without significant symptom  CURRENT MEDS . aspirin  324 mg Oral Once  . aspirin EC  81 mg Oral Daily  . atorvastatin  40 mg Oral q1800  . colchicine  0.6 mg Oral BID  . enoxaparin (LOVENOX) injection  40 mg Subcutaneous Q24H  . ibuprofen  600 mg Oral TID  . metoprolol tartrate  12.5 mg Oral BID  . multivitamin with minerals  1 tablet Oral Daily  . omega-3 acid ethyl esters  2 g Oral Daily  . pantoprazole  40 mg Oral Daily    OBJECTIVE  Filed Vitals:   05/13/15 0727 05/13/15 1355 05/13/15 1944 05/14/15 0325  BP: 109/62 119/68 110/58 117/56  Pulse:  71 66 65  Temp:  98.5 F (36.9 C) 98.7 F (37.1 C) 98.2 F (36.8 C)  TempSrc:  Oral Oral Oral  Resp: 16 18 17 17   Height:      Weight:    213 lb 3.2 oz (96.707 kg)  SpO2:  97% 97% 96%    Intake/Output Summary (Last 24 hours) at 05/14/15 1307 Last data filed at 05/14/15 0900  Gross per 24 hour  Intake    720 ml  Output      0 ml  Net    720 ml   Filed Weights   05/12/15 0500 05/13/15 0438 05/14/15 0325  Weight: 211 lb 4.8 oz (95.845 kg) 212 lb 4.9 oz (96.3 kg) 213 lb 3.2 oz (96.707 kg)    PHYSICAL EXAM  General: Pleasant, NAD. Neuro: Alert and oriented X 3. Moves all extremities spontaneously. Psych: Normal affect. HEENT:  Normal  Neck: Supple without bruits or JVD. Lungs:  Resp regular and unlabored, CTA. Heart: RRR no s3, s4, or murmurs. Abdomen: Soft, non-tender, non-distended, BS + x 4.  Extremities: No clubbing, cyanosis or edema. DP/PT/Radials 2+ and equal  bilaterally.  Accessory Clinical Findings  CBC  Recent Labs  05/13/15 0230 05/14/15 0414  WBC 13.9* 10.4  NEUTROABS 7.2  --   HGB 14.1 13.6  HCT 40.6 40.2  MCV 90.6 91.6  PLT 288 [id number]   Basic Metabolic Panel  Recent Labs  05/11/15 1613 05/14/15 0414  NA 138 138  K 4.4 4.0  CL 106 104  CO2 22 25  GLUCOSE 101* 111*  BUN 19 22*  CREATININE 1.15 1.17  CALCIUM 9.6 8.9  MG 1.8 2.2   Liver Function Tests  Recent Labs  05/11/15 1613  AST 32  ALT 36  ALKPHOS 63  BILITOT 0.7  PROT 6.7  ALBUMIN 4.2   No results for input(s): LIPASE, AMYLASE in the last 72 hours. 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  ------------------------------------------------------------------- History:  PMH:  Atrial fibrillation. Angina pectoris. Risk factors: Obstructive sleep apnea. Hypertension. Dyslipidemia.  -------------------------------------------------------------------  Study Conclusions  - Left ventricle: The cavity size was normal. Wall thickness was normal. Systolic function was vigorous. The estimated ejection fraction was in the range of 65% to 70%. Wall motion was normal; there were no regional wall motion abnormalities. The study is not technically sufficient to allow evaluation of LV diastolic function. - Right atrium: Central venous pressure (est): 15 mm Hg. - Inferior vena cava: The vessel was dilated. The respirophasic diameter changes were blunted (< 50%), consistent with elevated central venous pressure.    Radiology/Studies  Dg Chest 2 View  05/11/2015  CLINICAL DATA:  Chest pain since 0900 hours today former smoker, history  GERD EXAM: CHEST  2 VIEW COMPARISON:  04/04/2013 FINDINGS: Normal heart size, mediastinal contours, and pulmonary vascularity. Atherosclerotic calcification aorta. Lungs clear. No pleural effusion or pneumothorax. Bones unremarkable. IMPRESSION: No acute abnormalities. Electronically Signed   By: Lavonia Dana M.D.   On: 05/11/2015 17:20   Ct Angio Chest Pe W/cm &/or Wo Cm  05/12/2015  CLINICAL DATA:  Acute chest pain and shortness of breath. EXAM: CT ANGIOGRAPHY CHEST WITH CONTRAST TECHNIQUE: Multidetector CT imaging of the chest was performed using the standard protocol during bolus administration of intravenous contrast. Multiplanar CT image reconstructions and MIPs were obtained to evaluate the vascular anatomy. CONTRAST:  64mL OMNIPAQUE IOHEXOL 350 MG/ML SOLN COMPARISON:  Chest radiograph May 11, 2015. FINDINGS: No pneumothorax or pleural effusion is noted. 6 mm nodule is noted in the right lung base, best seen on image number 98 of series 3. Probable mild focus of subsegmental atelectasis or scarring is seen inferiorly in the lingular segment of left upper lobe. There is no evidence of thoracic aortic dissection or aneurysm. Visualized portion of upper abdomen is unremarkable. No mediastinal mass or adenopathy is noted. No significant osseous abnormality is noted. Review of the MIP images confirms the above findings. IMPRESSION: No evidence of pulmonary embolus. 6 mm nodule is noted in right lower lobe. If the patient is at high risk for bronchogenic carcinoma, follow-up chest CT at 6-12 months is recommended. If the patient is at low risk for bronchogenic carcinoma, follow-up chest CT at 12 months is recommended. This recommendation follows the consensus statement: Guidelines for Management of Small Pulmonary Nodules Detected on CT Scans: A Statement from the [place] as published in Radiology 2005;237:395-400. Electronically Signed   By: Marijo Conception, M.D.   On: 05/12/2015 11:31     ASSESSMENT AND PLAN  1. Chest pain  - Likely acute pericarditis. CTA negative for PE.   - Echo showed LV ef of 65-70%, no wm abnormality, RA central venous pressure was 15 mm HG, technically difficult study to evaluate LV diastolic function.   - Repeat EKG consistant with ST elevated in inferior lateral leads--> likely from pericarditis rather than ACS. Elevated sed rate. Troponin negative. Pain resolved on colchicine and ibuprofen.    - continue ASA, lipitor, cholchicine and ibuprofen. He will need to take ibuprofen 600mg  TID for [redacted] weeks along with protonix for GI protection. He will need 3 month of colchicine. Repeat EKG today, expect ST elevation improved.   2. Afib with RVR  - New onset. Converted to sinus rhythm overnight on Cardizem drip (given 20mg  of bolus), stopped this morning due to transient bradycardia to low 30s. Rate currently in 60-70s. Just noted 2nd degree HB. Likely continued effect for Cardizem. Will continue to monitor. IF rate goes above 100s will start short acting cardizem. Asymptomatic.   - CHADSVASCs score  of 2 (age, HTN).Per Dr. Claiborne Billings, would not institute anticoagulation therapy due to risk of potential hemorrhagic pericarditis and precipitant cause for his A. Fib  3. Leucocytosis  - improving  4. Single kidney  5. HL  - 03/19/2015: Cholesterol 166; HDL 44.30; LDL Cholesterol 90; Triglycerides 157.0*; VLDL 31.4   - Continue statin  6. 68mm R lower lobe lung nodule: informed patient, followup with PCP  Signed, Almyra Deforest PA-C Pager: [phone]  Patient seen and examined. Agree with assessment and plan.  Patient clinically feels well.  He denies any recurrent pleuritic chest pain.  His ST segment changes have resolved on his ECG.  He does not have any friction rub on physical exam.  The patient is stable for discharge today.  Will continue colchicine for 3 months and ibuprofen with a PPI for 2 weeks as an outpatient, with outpatient follow-up in 1-2 weeks.   At present, we'll continue low-dose beta blocker therapy.  The patient was instructed that if his resting pulse gets below 54 or if blood pressure becomes low this may need to be discontinued.  At present there is no indication for long-term anticoagulation therapy.   Troy Sine, MD, [facility] 05/14/2015 3:44 PM

## 2015-06-04 ENCOUNTER — Encounter: Payer: Self-pay | Admitting: Cardiology

## 2015-06-04 ENCOUNTER — Ambulatory Visit (INDEPENDENT_AMBULATORY_CARE_PROVIDER_SITE_OTHER): Payer: 59 | Admitting: Cardiology

## 2015-06-04 VITALS — BP 144/76 | HR 53 | Ht 68.0 in | Wt 211.8 lb

## 2015-06-04 DIAGNOSIS — I480 Paroxysmal atrial fibrillation: Secondary | ICD-10-CM

## 2015-06-04 NOTE — Progress Notes (Signed)
Cardiology Office Note   Date:  06/04/2015   ID:  Gabriel Aguilar, DOB 1950/11/28, MRN [id number]  PCP:  Laurey Morale, MD  Cardiologist:   [name], MD   Chief Complaint  Patient presents with  . Atrial Fibrillation      History of Present Illness: Gabriel Aguilar is a 65 y.o. male who presents for follow-up of pericarditis. He was recently in the hospital with chest discomfort. He was diagnosed with pericarditis. He did have an elevated white blood cell count. He was treated with colchicine and Motrin. The patient did have some transient atrial fibrillation. Since going home he has felt better. He's had none of the chest pain. He's had none of the palpitations. He does report that he has had palpitations over the years and he thinks might of been fibrillation as it felt somewhat the same. However, he hasn't had this recently. I do note that he wore a monitor couple of years ago and was not found to have any dysrhythmias. He will occasionally get some isolated skipped beats but no sustained tachyarrhythmias recently. He denies any new shortness of breath, PND or orthopnea. He's been active. Of note he did have echocardiography which was essentially unremarkable. I reviewed his records.   Past Medical History  Diagnosis Date  . GERD (gastroesophageal reflux disease)   . Hiatal hernia   . Arthritis   . Hyperlipidemia   . Sleep apnea, central     wears a CPAP at night   . Hypogonadism male     has seen  Dr. Orland Mustard  . Erectile dysfunction   . Palpitation     a. montior 2 weeks 2014--> no arrhythmia    Past Surgical History  Procedure Laterality Date  . 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 Spine surgery  1994    disc at L4-L5  . Eye surgery      LASIK  . Vasectomy    . Colonoscopy  07-09-12    per Dr. Deatra Ina, adenomatous polyp, repeat in 5 yrs      Current Outpatient Prescriptions  Medication Sig Dispense Refill  . aspirin EC  81 MG tablet Take 1 tablet (81 mg total) by mouth daily. 1 tablet 0  . atorvastatin (LIPITOR) 40 MG tablet Take 1 tablet by mouth  daily 90 tablet 3  . colchicine 0.6 MG tablet Take 1 tablet (0.6 mg total) by mouth 2 (two) times daily. For 3 months 60 tablet 2  . metoprolol tartrate (LOPRESSOR) 25 MG tablet Take 0.5 tablets (12.5 mg total) by mouth 2 (two) times daily. 60 tablet 0  . Misc. Devices KIT by Does not apply route. CPAP machine    . Multiple Vitamin (MULTI VITAMIN PO) Take 1 tablet by mouth daily.    . OMEGA 3 1000 MG CAPS Take 2 capsules by mouth daily.    [name] omeprazole (PRILOSEC) 40 MG capsule Take 1 capsule (40 mg total) by mouth daily. 90 capsule 3   No current facility-administered medications for this visit.    Allergies:   Review of patient's allergies indicates no known allergies.    ROS:  Please see the history of present illness.   Otherwise, review of systems are positive for none.   All other systems are reviewed and negative.    PHYSICAL EXAM: VS:  BP 144/76 mmHg  Pulse 53  Ht '5\' 8"'$  (1.727 m)  Wt 211 lb 12.8 oz (  96.072 kg)  BMI 32.21 kg/m2 , BMI Body mass index is 32.21 kg/(m^2). GENERAL:  Well appearing HEENT:  Pupils equal round and reactive, fundi not visualized, oral mucosa unremarkable NECK:  No jugular venous distention, waveform within normal limits, carotid upstroke brisk and symmetric, no bruits, no thyromegaly LUNGS:  Clear to auscultation bilaterally BACK:  No CVA tenderness CHEST:  Unremarkable HEART:  PMI not displaced or sustained,S1 and S2 within normal limits, no S3, no S4, no clicks, no rubs, no murmurs ABD:  Flat, positive bowel sounds normal in frequency in pitch, no bruits, no rebound, no guarding, no midline pulsatile mass, no hepatomegaly, no splenomegaly EXT:  2 plus pulses throughout, no edema, no cyanosis no clubbing     EKG:  EKG is ordered today. The ekg ordered today demonstrates sinus rhythm, rate 53, axis within normal limits,  intervals within normal limits, no acute ST-T wave.   Recent Labs: 03/19/2015: TSH 1.68 05/11/2015: ALT 36 05/14/2015: BUN 22*; Creatinine, Ser 1.17; Hemoglobin 13.6; Magnesium 2.2; Platelets 313; Potassium 4.0; Sodium 138    Lipid Panel    Component Value Date/Time   CHOL 166 03/19/2015 0805   TRIG 157.0* 03/19/2015 0805   HDL 44.30 03/19/2015 0805   CHOLHDL 4 03/19/2015 0805   VLDL 31.4 03/19/2015 0805   LDLCALC 90 03/19/2015 0805   LDLDIRECT 179.2 05/26/2013 0909      Wt Readings from Last 3 Encounters:  06/04/15 211 lb 12.8 oz (96.072 kg)  05/14/15 213 lb 3.2 oz (96.707 kg)  01/27/15 215 lb (97.523 kg)      Other studies Reviewed: Additional studies/ records that were reviewed today include: Hospital records. Review of the above records demonstrates:  Please see elsewhere in the note.     ASSESSMENT AND PLAN:  PERICARDITIS:  He will continue his colchicine. However, he will stop his Motrin. If he has recurrent pain he'll let me know.  HTN:  His blood pressure is borderline. He's going to keep a blood pressure diary as he comes off the nonsteroidal. No change in therapy is indicated at this moment.  ATRIAL FIB:   This seems to have been associated with the pericarditis. However, if he has recurrent palpitations he'll need an event monitor.  [name] has a CHA2DS2 - VASc score of 1 and would not need anticoagulation.   Current medicines are reviewed at length with the patient today.  The patient does not have concerns regarding medicines.  The following changes have been made:  no change  Labs/ tests ordered today include: EKG     Disposition:   FU with me in six months.    Signed, [name], MD  06/04/2015 1:19 PM    [facility]

## 2015-06-04 NOTE — Patient Instructions (Signed)
Dr [name] has recommended making the following medication changes: STOP Ibuprofen  Dr [name] recommends that you schedule a follow-up appointment in 6 months. You will receive a reminder letter in the mail two months in advance. If you don't receive a letter, please call our office to schedule the follow-up appointment.  If you need a refill on your cardiac medications before your next appointment, please call your pharmacy.

## 2015-07-01 ENCOUNTER — Encounter: Payer: Self-pay | Admitting: Family Medicine

## 2015-07-01 ENCOUNTER — Ambulatory Visit (INDEPENDENT_AMBULATORY_CARE_PROVIDER_SITE_OTHER): Payer: 59 | Admitting: Family Medicine

## 2015-07-01 ENCOUNTER — Telehealth: Payer: Self-pay | Admitting: *Deleted

## 2015-07-01 VITALS — BP 159/82 | HR 82 | Temp 99.3°F | Resp 20 | Wt 210.2 lb

## 2015-07-01 DIAGNOSIS — I480 Paroxysmal atrial fibrillation: Secondary | ICD-10-CM

## 2015-07-01 DIAGNOSIS — R319 Hematuria, unspecified: Secondary | ICD-10-CM

## 2015-07-01 DIAGNOSIS — R300 Dysuria: Secondary | ICD-10-CM

## 2015-07-01 DIAGNOSIS — R350 Frequency of micturition: Secondary | ICD-10-CM

## 2015-07-01 DIAGNOSIS — R8290 Unspecified abnormal findings in urine: Secondary | ICD-10-CM | POA: Diagnosis not present

## 2015-07-01 DIAGNOSIS — R631 Polydipsia: Secondary | ICD-10-CM

## 2015-07-01 DIAGNOSIS — N400 Enlarged prostate without lower urinary tract symptoms: Secondary | ICD-10-CM

## 2015-07-01 LAB — CBC WITH DIFFERENTIAL/PLATELET
BASOS PCT: 0.8 % (ref 0.0–3.0)
Basophils Absolute: 0.2 10*3/uL — ABNORMAL HIGH (ref 0.0–0.1)
EOS ABS: 0 10*3/uL (ref 0.0–0.7)
EOS PCT: 0.2 % (ref 0.0–5.0)
HEMATOCRIT: 40.3 % (ref 39.0–52.0)
HEMOGLOBIN: 13.6 g/dL (ref 13.0–17.0)
LYMPHS PCT: 18.6 % (ref 12.0–46.0)
Lymphs Abs: 4.3 10*3/uL — ABNORMAL HIGH (ref 0.7–4.0)
MCHC: 33.7 g/dL (ref 30.0–36.0)
MCV: 90.8 fl (ref 78.0–100.0)
Monocytes Absolute: 2.1 10*3/uL — ABNORMAL HIGH (ref 0.1–1.0)
Monocytes Relative: 8.9 % (ref 3.0–12.0)
Neutro Abs: 16.7 10*3/uL — ABNORMAL HIGH (ref 1.4–7.7)
Neutrophils Relative %: 71.5 % (ref 43.0–77.0)
Platelets: 364 10*3/uL (ref 150.0–400.0)
RBC: 4.44 Mil/uL (ref 4.22–5.81)
RDW: 13.4 % (ref 11.5–15.5)

## 2015-07-01 LAB — URINALYSIS, ROUTINE W REFLEX MICROSCOPIC
Bilirubin Urine: NEGATIVE
KETONES UR: NEGATIVE
NITRITE: NEGATIVE
Total Protein, Urine: NEGATIVE
Urine Glucose: NEGATIVE
Urobilinogen, UA: 0.2 (ref 0.0–1.0)
pH: 6 (ref 5.0–8.0)

## 2015-07-01 LAB — BASIC METABOLIC PANEL
BUN: 18 mg/dL (ref 6–23)
CO2: 28 meq/L (ref 19–32)
Calcium: 9.8 mg/dL (ref 8.4–10.5)
Chloride: 97 mEq/L (ref 96–112)
Creatinine, Ser: 1.21 mg/dL (ref 0.40–1.50)
GFR: 63.65 mL/min (ref >60.00)
GLUCOSE: 94 mg/dL (ref 70–99)
POTASSIUM: 4.5 meq/L (ref 3.5–5.1)
SODIUM: 135 meq/L (ref 135–145)

## 2015-07-01 LAB — POC URINALSYSI DIPSTICK (AUTOMATED)
Bilirubin, UA: NEGATIVE
GLUCOSE UA: NEGATIVE
KETONES UA: NEGATIVE
Nitrite, UA: NEGATIVE
Protein, UA: NEGATIVE
SPEC GRAV UA: 1.01
Urobilinogen, UA: 0.2
pH, UA: 6

## 2015-07-01 LAB — HEMOGLOBIN A1C: Hgb A1c MFr Bld: 5.9 % (ref 4.6–6.5)

## 2015-07-01 MED ORDER — CIPROFLOXACIN HCL 500 MG PO TABS: 500.0000 mg | ORAL_TABLET | Freq: Two times a day (BID) | ORAL | Status: DC

## 2015-07-01 NOTE — Telephone Encounter (Signed)
Santiago Glad from [employer] called critical lab result patient WBC's 23.4 . Patient was started on Cipro today by Dr Raoul Pitch. Dr Raoul Pitch out of office this afternoon. Information sent to Dr Ernestine Conrad

## 2015-07-01 NOTE — Progress Notes (Signed)
Patient ID: Gabriel Aguilar, male   DOB: [birth year], 65 y.o.   MRN: [id number]    Gabriel Aguilar , Nov 12, 1950, 65 y.o., male MRN: [id number]  CC: Fever Subjective: Pt presents for an acute OV with complaints of fever of 1 week duration. Associated symptoms include chills, frequent urination. He reports Wednesday he started to feel "worse", his frequency of urination increased and he has been up "every 30 m to urinate". He feels he is not able to empty his bladder completely. He endorses "uncomfortable BM" , but daily, the last two days, with no melana/hemaotchezia. He endorses new intermittent ringing in bilateral ears. Pt has a history of single kidney and BPH. He was recently diagnosed with PAF and pericarditis, and has followed with cardiology. He is presribed metoprolol and colchicine for the above. He was also taking Motrin for pericarditis and tylenol for fever.  Pt has tried "allergy" medicine and Tylenol to ease their symptoms.  Last PSA: 1.61 (03/19/2015)  He has questions about his new medicines and which ones he needs to take.   No Known Allergies Social History  Substance Use Topics  . Smoking status: Former Smoker -- 1.50 packs/day    Start date: 04/10/1964    Quit date: 03/06/1983  . Smokeless tobacco: Never Used  . Alcohol Use: No     Comment: rare   Past Medical History  Diagnosis Date  . GERD (gastroesophageal reflux disease)   . Hiatal hernia   . Arthritis   . Hyperlipidemia   . Sleep apnea, central     wears a CPAP at night   . Hypogonadism male     has seen  Dr. Orland Mustard  . Erectile dysfunction   . Palpitation     a. montior 2 weeks 2014--> no arrhythmia   Past Surgical History  Procedure Laterality Date  . 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 Spine surgery  1994    disc at L4-L5  . Eye surgery      LASIK  . Vasectomy    . Colonoscopy  07-09-12    per Dr. Deatra Ina, adenomatous polyp, repeat in 5 yrs    Family History    Problem Relation Age of Onset  . Hyperlipidemia Father   . Heart disease Father   . Hypertension Father   . Kidney disease Father   . Diabetes Father   . Colon cancer Neg Hx      Medication List       This list is accurate as of: 07/01/15 11:34 AM.  Always use your most recent med list.               aspirin EC 81 MG tablet  Take 1 tablet (81 mg total) by mouth daily.     atorvastatin 40 MG tablet  Commonly known as:  LIPITOR  Take 1 tablet by mouth  daily     colchicine 0.6 MG tablet  Take 1 tablet (0.6 mg total) by mouth 2 (two) times daily. For 3 months     metoprolol tartrate 25 MG tablet  Commonly known as:  LOPRESSOR  Take 0.5 tablets (12.5 mg total) by mouth 2 (two) times daily.     Misc. Devices Kit  by Does not apply route. CPAP machine     MULTI VITAMIN PO  Take 1 tablet by mouth daily.     Omega 3 1000 MG Caps  Take 2 capsules by mouth daily.  omeprazole 40 MG capsule  Commonly known as:  PRILOSEC  Take 1 capsule (40 mg total) by mouth daily.        ROS: Negative, with the exception of above mentioned in HPI  Objective:  BP 159/82 mmHg  Pulse 82  Temp(Src) 99.3 F (37.4 C)  Resp 20  Wt 210 lb 4 oz (95.369 kg)  SpO2 99% Body mass index is 31.98 kg/(m^2). Gen: febrile. No acute distress. Nontoxic in appearance, well developed, well nourished, pleasant male.  HENT: AT. [place]. Bilateral TM visualized and normal in appearance. MMM, no oral lesions. Bilateral nares without erythema or swelling. Throat without erythema or exudates. No cough or hoarseness on exam.  Eyes:Pupils Equal Round Reactive to light, Extraocular movements intact,  Conjunctiva without redness, discharge or icterus. Neck/lymp/endocrine: Supple, no lymphadenopathy CV: RRR  Chest: CTAB, no wheeze or crackles. Good air movement, normal resp effort.  Abd: Soft.flat.KeyFormulas.de suprapubic tenderness.  BS present MSK: No CVA tenderness Male genitalia: Prostate examination:  symmetrical, boggy: bilateral, tender: bilateral and enlarged: bilateral  Skin: No  rashes, purpura or petechiae.  Neuro: Normal gait. PERLA. EOMi. Alert. Oriented x3   Assessment/Plan: Gabriel Aguilar is a 65 y.o. male present for acute OV for  Urinary frequency/ABNL urine/Dysuria - H/O BPH/HO single kidney/polydipsia - No chandelier sign, however tender prostate on exam, not incredibly enlarged, spongy. Mild concern for prostatitis will await urine cultures, if needed can extend cipro treatment - POCT Urinalysis Dipstick (Automated)--> trace blood and leuks - Urinalysis, Routine w reflex microscopic - CBC w/Diff - Basic Metabolic Panel (BMET) - HgB A1c - Urine Culture - ciprofloxacin (CIPRO) 500 MG tablet; Take 1 tablet (500 mg total) by mouth 2 (two) times daily.  Dispense: 14 tablet; Refill: 0  PAF (paroxysmal atrial fibrillation) (HCC)/pericarditis: - pt had questions concerning mediations recently prescribed for PAF and pericarditis. He was not certain medication names, purpose, other than it is expensive. - reviewed cardio notes, he is likely taking about the colchicine, but uncertain.  - advised pt it appears he will be on this medication for 3 months and then follow up with cardiology. He should continue metoprolol as well, call his cardio if any questions or refill on medicaitons needed.      procatitis Cbc, a1c, bmp  electronically signed by:  Howard Pouch, DO  [place]

## 2015-07-01 NOTE — Patient Instructions (Signed)
Urinary Frequency °The number of times a normal person urinates depends upon how much liquid they take in and how much liquid they are losing. If the temperature is hot and there is high humidity, then the person will sweat more and usually breathe a little more frequently. These factors decrease the amount of frequency of urination that would be considered normal. °The amount you drink is easily determined, but the amount of fluid lost is sometimes more difficult to calculate.  °Fluid is lost in two ways: °· Sensible fluid loss is usually measured by the amount of urine that you get rid of. Losses of fluid can also occur with diarrhea. °· Insensible fluid loss is more difficult to measure. It is caused by evaporation. Insensible loss of fluid occurs through breathing and sweating. It usually ranges from a little less than a quart to a little more than a quart of fluid a day. °In normal temperatures and activity levels, the average person may urinate 4 to 7 times in a 24-hour period. Needing to urinate more often than that could indicate a problem. If one urinates 4 to 7 times in 24 hours and has large volumes each time, that could indicate a different problem from one who urinates 4 to 7 times a day and has small volumes. The time of urinating is also important. Most urinating should be done during the waking hours. Getting up at night to urinate frequently can indicate some problems. °CAUSES  °The bladder is the organ in your lower abdomen that holds urine. Like a balloon, it swells some as it fills up. Your nerves sense this and tell you it is time to head for the bathroom. There are a number of reasons that you might feel the need to urinate more often than usual. They include: °· Urinary tract infection. This is usually associated with other signs such as burning when you urinate. °· In men, problems with the prostate (a walnut-size gland that is located near the tube that carries urine out of your body). There  are two reasons why the prostate can cause an increased frequency of urination: °¨ An enlarged prostate that does not let the bladder empty well. If the bladder only half empties when you urinate, then it only has half the capacity to fill before you have to urinate again. °¨ The nerves in the bladder become more hypersensitive with an increased size of the prostate even if the bladder empties completely. °· Pregnancy. °· Obesity. Excess weight is more likely to cause a problem for women than for men. °· Bladder stones or other bladder problems. °· Caffeine. °· Alcohol. °· Medications. For example, drugs that help the body get rid of extra fluid (diuretics) increase urine production. Some other medicines must be taken with lots of fluids. °· Muscle or nerve weakness. This might be the result of a spinal cord injury, a stroke, multiple sclerosis, or Parkinson disease. °· Long-standing diabetes can decrease the sensation of the bladder. This loss of sensation makes it harder to sense the bladder needs to be emptied. Over a period of years, the bladder is stretched out by constant overfilling. This weakens the bladder muscles so that the bladder does not empty well and has less capacity to fill with new urine. °· Interstitial cystitis (also called painful bladder syndrome). This condition develops because the tissues that line the inside of the bladder are inflamed (inflammation is the body's way of reacting to injury or infection). It causes pain and frequent   urination. It occurs in women more often than in men. °DIAGNOSIS  °· To decide what might be causing your urinary frequency, your health care provider will probably: °¨ Ask about symptoms you have noticed. °¨ Ask about your overall health. This will include questions about any medications you are taking. °¨ Do a physical examination. °· Order some tests. These might include: °¨ A blood test to check for diabetes or other health issues that could be contributing  to the problem. °¨ Urine testing. This could measure the flow of urine and the pressure on the bladder. °¨ A test of your neurological system (the brain, spinal cord, and nerves). This is the system that senses the need to urinate. °¨ A bladder test to check whether it is emptying completely when you urinate. °¨ Cystoscopy. This test uses a thin tube with a tiny camera on it. It offers a look inside your urethra and bladder to see if there are problems. °¨ Imaging tests. You might be given a contrast dye and then asked to urinate. X-rays are taken to see how your bladder is working. °TREATMENT  °It is important for you to be evaluated to determine if the amount or frequency that you have is unusual or abnormal. If it is found to be abnormal, the cause should be determined and this can usually be found out easily. Depending upon the cause, treatment could include medication, stimulation of the nerves, or surgery. °There are not too many things that you can do as an individual to change your urinary frequency. It is important that you balance the amount of fluid intake needed to compensate for your activity and the temperature. Medical problems will be diagnosed and taken care of by your physician. There is no particular bladder training such as Kegel exercises that you can do to help urinary frequency. This is an exercise that is usually recommended for people who have leaking of urine when they laugh, cough, or sneeze. °HOME CARE INSTRUCTIONS  °· Take any medications your health care provider prescribed or suggested. Follow the directions carefully. °· Practice any lifestyle changes that are recommended. These might include: °¨ Drinking less fluid or drinking at different times of the day. If you need to urinate often during the night, for example, you may need to stop drinking fluids early in the evening. °¨ Cutting down on caffeine or alcohol. They both can make you need to urinate more often than normal. Caffeine  is found in coffee, tea, and sodas. °¨ Losing weight, if that is recommended. °· Keep a journal or a log. You might be asked to record how much you drink and when and where you feel the need to urinate. This will also help evaluate how well the treatment provided by your physician is working. °SEEK MEDICAL CARE IF:  °· Your need to urinate often gets worse. °· You feel increased pain or irritation when you urinate. °· You notice blood in your urine. °· You have questions about any medications that your health care provider recommended. °· You notice blood, pus, or swelling at the site of any test or treatment procedure. °· You develop a fever of more than 100.5°F (38.1°C). °SEEK IMMEDIATE MEDICAL CARE IF:  °You develop a fever of more than 102.0°F (38.9°C). °  °This information is not intended to replace advice given to you by your health care provider. Make sure you discuss any questions you have with your health care provider. °  °Document Released: 01/21/2009 Document Revised:   04/17/2014 Document Reviewed: 01/21/2009 °Elsevier Interactive Patient Education ©2016 [employer] ° °

## 2015-07-01 NOTE — Telephone Encounter (Signed)
Discussed this with Dr. Lucita Lora CMA, [facility] Sia. She said that in her opinion the patient did not appear septic when in office today. She said he presented with an infectious illness and was rx'd cipro. I don't think any particular change in plan needs to be made at this time. Will forward this to Dr. Raoul Pitch so she is aware.

## 2015-07-02 NOTE — Telephone Encounter (Signed)
Unable to reach patient at listed phone numbers .Left message with complete details on patient voice mail.

## 2015-07-02 NOTE — Telephone Encounter (Signed)
Please call patient: His lab work from yesterday indicated a very significant infection. I would like him to follow up with either myself or his PCP by Tuesday, therefore we can make certain he is improving. He will likely need an extended course of antibiotics, and we will be able to discuss in more detail at that time. If he develops/continues to have fever with use of antibiotics, and he does not see improvement in his symptoms over the weekend, he should be seen immediately at the emergency room.

## 2015-07-03 LAB — URINE CULTURE: Colony Count: 50000

## 2015-07-05 ENCOUNTER — Telehealth: Payer: Self-pay | Admitting: Family Medicine

## 2015-07-05 ENCOUNTER — Encounter: Payer: Self-pay | Admitting: Family Medicine

## 2015-07-05 ENCOUNTER — Ambulatory Visit: Payer: 59 | Admitting: Family Medicine

## 2015-07-05 ENCOUNTER — Ambulatory Visit (INDEPENDENT_AMBULATORY_CARE_PROVIDER_SITE_OTHER): Payer: 59 | Admitting: Family Medicine

## 2015-07-05 VITALS — BP 131/84 | HR 65 | Temp 98.1°F | Resp 20 | Wt 207.2 lb

## 2015-07-05 DIAGNOSIS — N419 Inflammatory disease of prostate, unspecified: Secondary | ICD-10-CM | POA: Insufficient documentation

## 2015-07-05 DIAGNOSIS — R300 Dysuria: Secondary | ICD-10-CM | POA: Diagnosis not present

## 2015-07-05 DIAGNOSIS — N410 Acute prostatitis: Secondary | ICD-10-CM | POA: Diagnosis not present

## 2015-07-05 MED ORDER — LEVOFLOXACIN 500 MG PO TABS: 500.0000 mg | ORAL_TABLET | Freq: Every day | ORAL | Status: DC

## 2015-07-05 NOTE — Telephone Encounter (Signed)
Left message with complete instructions on patient voice mail.

## 2015-07-05 NOTE — Telephone Encounter (Signed)
Please call Gabriel Aguilar, I would like to have his CBC repeated 7-10 days (after Thursday) into his treatment, this has been placed as a future order and can be completed at any of the offices (lab appt if needed).

## 2015-07-05 NOTE — Patient Instructions (Signed)

## 2015-07-05 NOTE — Progress Notes (Signed)
Patient ID: Gabriel Aguilar, male   DOB: Jul 17, 1950, 65 y.o.   MRN: [id number]    Gabriel Aguilar , [birth year], 65 y.o., male MRN: [id number]  CC: dysuria/prostatits  Subjective: Pt presents for an follow up OV after treatment with cipro for dysuria. Pt is 4 days in to abx treatment, with reports of improvement in all urinary symptoms. Urine culture resulted with 50K enterococcus species sensitive to levaquin. Pt had an elevated WBC of 23.4. He reports feeling much improved and energy today. He states, he feels like it finally "broke".  He endorses fever Saturday into Sunday. He reports only needing to go to the bathroom once last night, which is normal for him. He no longer is experincingthe bladder pressure or incomplete emptying he had prior. BMs no longer uncomfortable.   No Known Allergies Social History  Substance Use Topics  . Smoking status: Former Smoker -- 1.50 packs/day    Start date: 04/10/1964    Quit date: 03/06/1983  . Smokeless tobacco: Never Used  . Alcohol Use: No     Comment: rare   Past Medical History  Diagnosis Date  . GERD (gastroesophageal reflux disease)   . Hiatal hernia   . Arthritis   . Hyperlipidemia   . Sleep apnea, central     wears a CPAP at night   . Hypogonadism male     has seen  Dr. Orland Mustard  . Erectile dysfunction   . Palpitation     a. montior 2 weeks 2014--> no arrhythmia   Past Surgical History  Procedure Laterality Date  . 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 Spine surgery  1994    disc at L4-L5  . Eye surgery      LASIK  . Vasectomy    . Colonoscopy  07-09-12    per Dr. Deatra Ina, adenomatous polyp, repeat in 5 yrs    Family History  Problem Relation Age of Onset  . Hyperlipidemia Father   . Heart disease Father   . Hypertension Father   . Kidney disease Father   . Diabetes Father   . Colon cancer Neg Hx      Medication List       This list is accurate as of: 07/05/15  8:21 AM.  Always use  your most recent med list.               aspirin EC 81 MG tablet  Take 1 tablet (81 mg total) by mouth daily.     atorvastatin 40 MG tablet  Commonly known as:  LIPITOR  Take 1 tablet by mouth  daily     ciprofloxacin 500 MG tablet  Commonly known as:  CIPRO  Take 1 tablet (500 mg total) by mouth 2 (two) times daily.     colchicine 0.6 MG tablet  Take 1 tablet (0.6 mg total) by mouth 2 (two) times daily. For 3 months     metoprolol tartrate 25 MG tablet  Commonly known as:  LOPRESSOR  Take 0.5 tablets (12.5 mg total) by mouth 2 (two) times daily.     Misc. Devices Kit  by Does not apply route. CPAP machine     MULTI VITAMIN PO  Take 1 tablet by mouth daily.     Omega 3 1000 MG Caps  Take 2 capsules by mouth daily.     omeprazole 40 MG capsule  Commonly known as:  PRILOSEC  Take 1 capsule (40 mg  total) by mouth daily.        ROS: Negative, with the exception of above mentioned in HPI  Objective:  BP 131/84 mmHg  Pulse 65  Temp(Src) 98.1 F (36.7 C)  Resp 20  Wt 207 lb 4 oz (94.008 kg)  SpO2 95% Body mass index is 31.52 kg/(m^2). Gen: afebrile. No acute distress. Nontoxic in appearance, well developed, well nourished, pleasant male.  HENT: AT. [place].MMM, no oral lesions.  Eyes:Pupils Equal Round Reactive to light, Extraocular movements intact,  Conjunctiva without redness, discharge or icterus. CV: RRR  Abd: Soft.flat.ND.Nontender.BS present MSK: No CVA tenderness Neuro: Normal gait. PERLA. EOMi. Alert. Oriented x3   Assessment/Plan: Gabriel Aguilar is a 65 y.o. male present for acute OV for  Urinary frequency/ABNL urine/Dysuria/Prostatitis:  - H/O BPH/HO single kidney/polydipsia - Follow up to dysuria. Enterococcus 50K on culture. Improvement in symptoms on cipro.  - Continue on Levaquin additional 21 days to cover acute prostatitis, favor prostatitis as cause with such a high WBC count, also discussed concern for infection in another location given recent  h/o pericarditis and elevated WBC count (23.4). - Levaquin 500 mg 21 days - CBC with diff repeat (at 1 week of therapy, 30th)  electronically signed by:  Howard Pouch, DO  [place]

## 2015-07-21 ENCOUNTER — Telehealth: Payer: Self-pay | Admitting: Family Medicine

## 2015-07-21 ENCOUNTER — Other Ambulatory Visit (INDEPENDENT_AMBULATORY_CARE_PROVIDER_SITE_OTHER): Payer: 59

## 2015-07-21 DIAGNOSIS — N410 Acute prostatitis: Secondary | ICD-10-CM | POA: Diagnosis not present

## 2015-07-21 LAB — CBC WITH DIFFERENTIAL/PLATELET
BASOS ABS: 0.1 10*3/uL (ref 0.0–0.1)
Basophils Relative: 0.8 % (ref 0.0–3.0)
EOS ABS: 0.3 10*3/uL (ref 0.0–0.7)
Eosinophils Relative: 2.5 % (ref 0.0–5.0)
HEMATOCRIT: 39.3 % (ref 39.0–52.0)
HEMOGLOBIN: 13.5 g/dL (ref 13.0–17.0)
LYMPHS PCT: 53.1 % — AB (ref 12.0–46.0)
Lymphs Abs: 5.6 10*3/uL — ABNORMAL HIGH (ref 0.7–4.0)
MCHC: 34.3 g/dL (ref 30.0–36.0)
MCV: 89.5 fl (ref 78.0–100.0)
Monocytes Absolute: 0.8 10*3/uL (ref 0.1–1.0)
Monocytes Relative: 8 % (ref 3.0–12.0)
Neutro Abs: 3.7 10*3/uL (ref 1.4–7.7)
Neutrophils Relative %: 35.6 % — ABNORMAL LOW (ref 43.0–77.0)
Platelets: 403 10*3/uL — ABNORMAL HIGH (ref 150.0–400.0)
RBC: 4.39 Mil/uL (ref 4.22–5.81)
RDW: 14.3 % (ref 11.5–15.5)
WBC: 10.5 10*3/uL (ref 4.0–10.5)

## 2015-07-21 NOTE — Telephone Encounter (Signed)
Please call pt: - His labs are now stable/returned to baseline. No signs of infection.

## 2015-07-22 NOTE — Telephone Encounter (Signed)
Spoke with patient reviewed lab results. 

## 2015-09-02 ENCOUNTER — Other Ambulatory Visit: Payer: Self-pay | Admitting: Family Medicine

## 2015-09-02 NOTE — Telephone Encounter (Signed)
Opened in error

## 2015-09-03 ENCOUNTER — Telehealth: Payer: Self-pay | Admitting: Pulmonary Disease

## 2015-09-03 NOTE — Telephone Encounter (Signed)
Received message from Santiago Glad at [facility] office that patient is trying to contact me, not sure why, no other information was obtained. Attempted to contact patient, left message for patient to call back.

## 2015-09-07 ENCOUNTER — Other Ambulatory Visit: Payer: Self-pay | Admitting: *Deleted

## 2015-09-07 MED ORDER — COLCHICINE 0.6 MG PO TABS: 0.6000 mg | ORAL_TABLET | Freq: Two times a day (BID) | ORAL | Status: DC

## 2015-09-07 NOTE — Telephone Encounter (Signed)
Rx done. 

## 2015-09-14 ENCOUNTER — Telehealth: Payer: Self-pay | Admitting: Pulmonary Disease

## 2015-09-14 NOTE — Telephone Encounter (Signed)
Spoke with pt. He needs a prescription for a new CPAP machine. Advised him that we have not seen him since 07/2014. He will need an appointment before we can get him a new CPAP. He agreed. ROV has been scheduled with SG on 09/20/15 at 12pm. Nothing further was needed.

## 2015-09-20 ENCOUNTER — Ambulatory Visit (INDEPENDENT_AMBULATORY_CARE_PROVIDER_SITE_OTHER): Payer: 59 | Admitting: Acute Care

## 2015-09-20 ENCOUNTER — Encounter: Payer: Self-pay | Admitting: Acute Care

## 2015-09-20 VITALS — BP 122/78 | HR 62 | Ht 68.0 in | Wt 215.0 lb

## 2015-09-20 DIAGNOSIS — G4733 Obstructive sleep apnea (adult) (pediatric): Secondary | ICD-10-CM

## 2015-09-20 NOTE — Patient Instructions (Addendum)
It is nice to meet you today. We will request a new machine for you today.  We will request new equipment for you today Gabriel Aguilar will set the machine settings to the same settings as your old machine. Continue on CPAP at bedtime. You appear to be benefiting from the treatment Goal is to wear for at least 6-8 hours each night for maximal clinical benefit. Continue to work on weight loss, as the link between excess weight  and sleep apnea is well established.  Do not drive if sleepy. Follow up with Gabriel Aguilar in 6 months  or before as needed.  Will need follow up CT for nodule follow up at this time. Please contact office for sooner follow up if symptoms do not improve or worsen or seek emergency care

## 2015-09-20 NOTE — Assessment & Plan Note (Signed)
[facility]A Follow Up. 100% Compliant 65 year old machine does not have download capability:  Plan: We will request a new machine for you today.  We will request new equipment for you today Huey Romans will set the machine settings to the same settings as your old machine. Continue on CPAP at bedtime. You appear to be benefiting from the treatment Goal is to wear for at least 6-8 hours each night for maximal clinical benefit. Continue to work on weight loss, as the link between excess weight  and sleep apnea is well established.  Do not drive if sleepy. Follow up with Elsworth Soho in 6 months  or before as needed.  Please contact office for sooner follow up if symptoms do not improve or worsen or seek emergency care

## 2015-09-20 NOTE — Progress Notes (Signed)
History of Present Illness Gabriel Aguilar is a 65 y.o. male with OSA on CPAP followed by Dr. Elsworth Soho. PSG 11/2001 showed AHI 20/h, predom apneas, longest 70 s, corrected by CPAP 8 cm He was set up by her [employer]Med S7 elite CPAP machine with nasal pillows, ramp Time of 20 minutes. His DME is [facility], he has not changed supplies in years    09/20/2015 Annual Follow Up Appointment:  Pt. Presents today for annual follow up of CPAP. He is 100% compliant per his own history. His machine is 65 years old, and does not have download capability. We will place a request with his DME for a new machine.He states that he cannot sleep without his machine. He is unaware of his settings, but we will have [employer] set it to the same settings as his current machine.He states he will new new equipment also as his head gear is old. He feels well rested upon awakening each morning.He denies daytime sleepiness,and appears to be  benefiting from therapy. He has no other complaints.   Past medical hx Past Medical History  Diagnosis Date  . GERD (gastroesophageal reflux disease)   . Hiatal hernia   . Arthritis   . Hyperlipidemia   . Sleep apnea, central     wears a CPAP at night   . Hypogonadism male     has seen  Dr. Orland Mustard  . Erectile dysfunction   . Palpitation     a. montior 2 weeks 2014--> no arrhythmia     Past surgical hx, Family hx, Social hx all reviewed.  Current Outpatient Prescriptions on File Prior to Visit  Medication Sig  . aspirin EC 81 MG tablet Take 1 tablet (81 mg total) by mouth daily.  [name] atorvastatin (LIPITOR) 40 MG tablet Take 1 tablet by mouth  daily  . colchicine 0.6 MG tablet Take 1 tablet (0.6 mg total) by mouth 2 (two) times daily. For 3 months  . Misc. Devices KIT by Does not apply route. CPAP machine  . Multiple Vitamin (MULTI VITAMIN PO) Take 1 tablet by mouth daily.  . OMEGA 3 1000 MG CAPS Take 2 capsules by mouth daily.  [name] omeprazole (PRILOSEC) 40 MG capsule Take 1 capsule (40  mg total) by mouth daily.   No current facility-administered medications on file prior to visit.     No Known Allergies  Review Of Systems:  Constitutional:   No  weight loss, night sweats,  Fevers, chills, fatigue, or  lassitude.  HEENT:   No headaches,  Difficulty swallowing,  Tooth/dental problems, or  Sore throat,                No sneezing, itching, ear ache, nasal congestion, post nasal drip,   CV:  No chest pain,  Orthopnea, PND, swelling in lower extremities, anasarca, dizziness, palpitations, syncope.   GI  No heartburn, indigestion, abdominal pain, nausea, vomiting, diarrhea, change in bowel habits, loss of appetite, bloody stools.   Resp: No shortness of breath with exertion or at rest.  No excess mucus, no productive cough,  No non-productive cough,  No coughing up of blood.  No change in color of mucus.  No wheezing.  No chest wall deformity  Skin: no rash or lesions.  GU: no dysuria, change in color of urine, no urgency or frequency.  No flank pain, no hematuria   MS:  No joint pain or swelling.  No decreased range of motion.  No back pain.  Psych:  No  change in mood or affect. No depression or anxiety.  No memory loss.   Vital Signs BP 122/78 mmHg  Pulse 62  Ht '5\' 8"'$  (1.727 m)  Wt 215 lb (97.523 kg)  BMI 32.70 kg/m2  SpO2 94%   Physical Exam:  General- No distress,  A&Ox3, very pleasant ENT: No sinus tenderness, TM clear, pale nasal mucosa, no oral exudate,no post nasal drip, no LAN Cardiac: S1, S2, regular rate and rhythm, no murmur Chest: No wheeze/ rales/ dullness; no accessory muscle use, no nasal flaring, no sternal retractions Abd.: Soft Non-tender Ext: No clubbing cyanosis, edema Neuro:  normal strength Skin: No rashes, warm and dry Psych: normal mood and behavior   Assessment/Plan  OSA (obstructive sleep apnea) OSA Follow Up. 100% Compliant 65 year old machine does not have download capability:  Plan: We will request a new machine for  you today.  We will request new equipment for you today Huey Romans will set the machine settings to the same settings as your old machine. Continue on CPAP at bedtime. You appear to be benefiting from the treatment Goal is to wear for at least 6-8 hours each night for maximal clinical benefit. Continue to work on weight loss, as the link between excess weight  and sleep apnea is well established.  Do not drive if sleepy. Follow up with Elsworth Soho in 6 months  or before as needed.  Please contact office for sooner follow up if symptoms do not improve or worsen or seek emergency care       Magdalen Spatz, NP 09/20/2015  1:26 PM

## 2015-09-20 NOTE — Progress Notes (Signed)
Reviewed & agree with plan CPAP 8 cm

## 2015-09-23 ENCOUNTER — Telehealth: Payer: Self-pay | Admitting: Acute Care

## 2015-09-23 NOTE — Telephone Encounter (Signed)
I have already had Eric Form, NP to sign this order and it has been faxed 2 times once to [phone] and [phone]

## 2015-09-23 NOTE — Telephone Encounter (Signed)
lmtcb and in the meantime will forward to [place] to see if she has CMN

## 2015-10-11 ENCOUNTER — Telehealth: Payer: Self-pay | Admitting: *Deleted

## 2015-10-11 NOTE — Telephone Encounter (Signed)
FYI - Home advice given. Please advise on home advice sufficient or if warrants appointment     PLEASE NOTE: All timestamps contained within this report are represented as [place] Standard Time. CONFIDENTIALTY NOTICE: This fax transmission is intended only for the addressee. It contains information that is legally privileged, confidential or otherwise protected from use or disclosure. If you are not the intended recipient, you are strictly prohibited from reviewing, disclosing, copying using or disseminating any of this information or taking any action in reliance on or regarding this information. If you have received this fax in error, please notify us immediately by telephone so that we can arrange for its return to [place]. Phone: [phone], Toll-Free: [phone], Fax: [phone] Page: 1 of 2 Call Id: [id number] [name] - Client [place] Patient Name: Gabriel Aguilar Gender: Male DOB: 1950-11-24 Age: 66 Y 10 M 18 D Return Phone Number: [phone] (Primary), [phone] (Secondary) Address: City/State/Zip: [place] Client [facility] - Client Client Site [facility] - [facility] Physician Alysia Penna - MD Contact Type Call Who Is Calling Patient / Member / Family / Caregiver Call Type Triage / Clinical Relationship To Patient Self Return Phone Number [phone] (Primary) Chief Complaint Tick Bite Reason for Call Symptomatic / Request for [name] states he pulled a tick off the back of his head. PreDisposition Did not know what to do Translation No Nurse Assessment Nurse: Zenia Resides, RN, Shirlee Limerick Date/Time Eilene Ghazi Time): 10/09/2015 10:48:03 PM Confirm and document reason for call. If symptomatic, describe symptoms. You must click the next button to save text entered. ---Caller states he pulled a tick off the back of his head. Has the  patient traveled out of the country within the last 30 days? ---Not Applicable Does the patient have any new or worsening symptoms? ---Yes Will a triage be completed? ---Yes Related visit to physician within the last 2 weeks? ---N/A Does the PT have any chronic conditions? (i.e. diabetes, asthma, etc.) ---Yes List chronic conditions. ---high cholesterol, pericarditis, Is this a behavioral health or substance abuse call? ---No Guidelines Guideline Title Affirmed Question Affirmed Notes Nurse Date/Time (Eastern Time) Tick Bite Tick bite with no complications Shea Evans 10/09/2015 10:50:43 PM Disp. Time Eilene Ghazi Time) Disposition Final User 10/09/2015 11:00:17 PM Home Care Yes Zenia Resides, RN, Abran Duke Understands: Yes PLEASE NOTE: All timestamps contained within this report are represented as [place] Standard Time. CONFIDENTIALTY NOTICE: This fax transmission is intended only for the addressee. It contains information that is legally privileged, confidential or otherwise protected from use or disclosure. If you are not the intended recipient, you are strictly prohibited from reviewing, disclosing, copying using or disseminating any of this information or taking any action in reliance on or regarding this information. If you have received this fax in error, please notify us immediately by telephone so that we can arrange for its return to [place]. Phone: [phone], Toll-Free: [phone], Fax: [phone] Page: 2 of 2 Call Id: [id number] Disagree/Comply: Comply Care Advice Given Per Guideline ANTIBIOTIC OINTMENT: Wash the wound and your hands with soap and water after removal to prevent catching any tick disease. Apply antibiotic ointment (OTC) to the bite once. EXPECTED COURSE: Tick bites normally don't itch or hurt. That's why they often go unnoticed. TETANUS BOOSTER: If last tetanus shot was given over 10 years ago, needs a booster. Call PCP during regular office hours (within 3 days)  CALL BACK IF: * Fever  or rash occur in the next 2 weeks * Bite begins to look infected * You become worse. CARE ADVICE given per Tick Bites (Adult) guideline. HOME CARE: You should be able to treat this at home. REASSURANCE: Most tick bites are harmless and can be treated at home. The spread of disease by ticks is rare.

## 2015-12-24 ENCOUNTER — Telehealth: Payer: Self-pay | Admitting: Family Medicine

## 2015-12-24 NOTE — Telephone Encounter (Addendum)
Pt needs biometric screening before 9/30. Pt needs fasting labs prior. Ok to schedule?

## 2015-12-28 NOTE — Telephone Encounter (Signed)
Pt recently had some labs drawn. He is probably due for a physical with Dr. Sarajane Jews. He should schedule this and fast morning of appointment. You can work in as needed.

## 2016-01-27 ENCOUNTER — Encounter: Payer: Self-pay | Admitting: Cardiology

## 2016-02-02 ENCOUNTER — Telehealth: Payer: Self-pay | Admitting: Pulmonary Disease

## 2016-02-02 NOTE — Telephone Encounter (Signed)
Called and spoke to pt. 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 Pt states he is faxing over the requirements and a sample letter.   Will fax to [place] to f/u on.

## 2016-02-09 NOTE — Progress Notes (Signed)
Cardiology Office Note   Date:  02/10/2016   ID:  Gabriel Aguilar, DOB 03-15-1951, MRN [id number]  PCP:  Laurey Morale, MD  Cardiologist:   [name], MD   Chief Complaint  Patient presents with  . Pericarditis      History of Present Illness: Gabriel Aguilar is a 65 y.o. male who presents for follow-up of pericarditis. He was in the hospital with chest discomfort in January. He was diagnosed with pericarditis. He did have an elevated white blood cell count. He was treated with colchicine and Motrin. The patient did have some transient atrial fibrillation.  He returns for follow up.  He has had some rare palpitations. However, he has felt well. He's had no sustained dysrhythmias. He's had none of the pain that was his previous pericarditis. He exercises and goes to the gym. The patient denies any new symptoms such as chest discomfort, neck or arm discomfort. There has been no new shortness of breath, PND or orthopnea. There has been no reported presyncope or syncope.  Past Medical History:  Diagnosis Date  . Arthritis   . Erectile dysfunction   . GERD (gastroesophageal reflux disease)   . Hiatal hernia   . Hyperlipidemia   . Hypogonadism male    has seen  Dr. Orland Mustard  . Palpitation    a. montior 2 weeks 2014--> no arrhythmia  . Sleep apnea, central    wears a CPAP at night     Past Surgical History:  Procedure Laterality Date  . COLONOSCOPY  07-09-12   per Dr. Deatra Ina, adenomatous polyp, repeat in 5 yrs   . EYE SURGERY     LASIK  . 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 SPINE SURGERY  1994   disc at L4-L5  . VASECTOMY       Current Outpatient Prescriptions  Medication Sig Dispense Refill  . aspirin EC 81 MG tablet Take 1 tablet (81 mg total) by mouth daily. 1 tablet 0  . atorvastatin (LIPITOR) 40 MG tablet Take 1 tablet by mouth  daily 90 tablet 3  . Misc. Devices KIT by Does not apply route. CPAP machine    . Multiple Vitamin  (MULTI VITAMIN PO) Take 1 tablet by mouth daily.    . OMEGA 3 1000 MG CAPS Take 2 capsules by mouth daily.    [name] omeprazole (PRILOSEC) 40 MG capsule Take 1 capsule (40 mg total) by mouth daily. 90 capsule 3   No current facility-administered medications for this visit.    Family history:    His father did have heart disease. He was also history of hypertension and hyperlipidemia.  Allergies:   Review of patient's allergies indicates no known allergies.    ROS:  Please see the history of present illness.   Otherwise, review of systems are positive for none.   All other systems are reviewed and negative.    PHYSICAL EXAM: VS:  BP 130/86   Pulse 75   Ht _0  (1.727 m)   Wt 218 lb (98.9 kg)   BMI 33.15 kg/m  , BMI Body mass index is 33.15 kg/m. GENERAL:  Well appearing HEENT:  Pupils equal round and reactive, fundi not visualized, oral mucosa unremarkable NECK:  No jugular venous distention, waveform within normal limits, carotid upstroke brisk and symmetric, no bruits, no thyromegaly LUNGS:  Clear to auscultation bilaterally BACK:  No CVA tenderness CHEST:  Unremarkable HEART:  PMI not displaced  or sustained,S1 and S2 within normal limits, no S3, no S4, no clicks, no rubs, no murmurs ABD:  Flat, positive bowel sounds normal in frequency in pitch, no bruits, no rebound, no guarding, no midline pulsatile mass, no hepatomegaly, no splenomegaly EXT:  2 plus pulses throughout, no edema, no cyanosis no clubbing    EKG:  EKG is ordered today. The ekg ordered today demonstrates sinus rhythm, rate 62, axis within normal limits, intervals within normal limits, no acute ST-T wave.   Recent Labs: 03/19/2015: TSH 1.68 05/11/2015: ALT 36 05/14/2015: Magnesium 2.2 07/01/2015: BUN 18; Creatinine, Ser 1.21; Potassium 4.5; Sodium 135 07/21/2015: Hemoglobin 13.5; Platelets 403.0    Lipid Panel    Component Value Date/Time   CHOL 166 03/19/2015 0805   TRIG 157.0 (H) 03/19/2015 0805   HDL 44.30  03/19/2015 0805   CHOLHDL 4 03/19/2015 0805   VLDL 31.4 03/19/2015 0805   LDLCALC 90 03/19/2015 0805   LDLDIRECT 179.2 05/26/2013 0909      Wt Readings from Last 3 Encounters:  02/10/16 218 lb (98.9 kg)  09/20/15 215 lb (97.5 kg)  07/05/15 207 lb 4 oz (94 kg)      Other studies Reviewed: Additional studies/ records that were reviewed today include: None Review of the above records demonstrates:     ASSESSMENT AND PLAN:  PERICARDITIS:  This is resolved.  No further testing is indicated.    HTN:  The blood pressure is at target. No change in medications is indicated. We will continue with therapeutic lifestyle changes (TLC).  ATRIAL FIB:   This was only at time of his pericarditis. Is otherwise been no documented episodes. If he has any increasing palpitations he can let me know but for now there is no change in therapy.   [name] has a CHA2DS2 - VASc score of 1 and would not need anticoagulation.  FAA EXAM:    The patient has an excellent functional capacity. He has no significant modifiable cardiovascular risk factors that need to be further addressed. His prognosis for incapacitation is extremely low. Blood chemistries will be ordered including lipid profile, basic metabolic profile and CBC.  Current medicines are reviewed at length with the patient today.  The patient does not have concerns regarding medicines.  The following changes have been made:  no change  Labs/ tests ordered today include: EKG     Disposition:   FU with me in six months.    Signed, [name], MD  02/10/2016 12:31 PM    [place]

## 2016-02-10 ENCOUNTER — Encounter: Payer: Self-pay | Admitting: Cardiology

## 2016-02-10 ENCOUNTER — Ambulatory Visit (INDEPENDENT_AMBULATORY_CARE_PROVIDER_SITE_OTHER): Payer: 59 | Admitting: Cardiology

## 2016-02-10 VITALS — BP 130/86 | HR 75 | Ht 68.0 in | Wt 218.0 lb

## 2016-02-10 DIAGNOSIS — I10 Essential (primary) hypertension: Secondary | ICD-10-CM

## 2016-02-10 DIAGNOSIS — E785 Hyperlipidemia, unspecified: Secondary | ICD-10-CM

## 2016-02-10 DIAGNOSIS — I480 Paroxysmal atrial fibrillation: Secondary | ICD-10-CM | POA: Diagnosis not present

## 2016-02-10 DIAGNOSIS — I300 Acute nonspecific idiopathic pericarditis: Secondary | ICD-10-CM | POA: Diagnosis not present

## 2016-02-10 LAB — CBC
HEMATOCRIT: 44 % (ref 38.5–50.0)
HEMOGLOBIN: 14.5 g/dL (ref 13.2–17.1)
MCH: 30.7 pg (ref 27.0–33.0)
MCHC: 33 g/dL (ref 32.0–36.0)
MCV: 93.2 fL (ref 80.0–100.0)
MPV: 8.5 fL (ref 7.5–12.5)
Platelets: 338 10*3/uL (ref 140–400)
RBC: 4.72 MIL/uL (ref 4.20–5.80)
RDW: 13.8 % (ref 11.0–15.0)
WBC: 11.9 10*3/uL — ABNORMAL HIGH (ref 3.8–10.8)

## 2016-02-10 NOTE — Patient Instructions (Addendum)
Medication Instructions:  Continue current medications  Labwork: CBC, BMP, and Fasting Lipids  Testing/Procedures: None Ordered  Follow-Up: Your physician recommends that you schedule a follow-up appointment in: As Needed   Any Other Special Instructions Will Be Listed Below (If Applicable).   If you need a refill on your cardiac medications before your next appointment, please call your pharmacy.

## 2016-02-10 NOTE — Telephone Encounter (Signed)
Called and spoke to pt. This letter was already faxed on 02/02/16. Advised pt to refax to my attention as Sharyn Lull has been out. Pt verbalized understanding.   Will forward to myself to follow.

## 2016-02-11 LAB — LIPID PANEL
CHOLESTEROL: 215 mg/dL — AB (ref 125–200)
HDL: 45 mg/dL (ref >=40)
LDL Cholesterol: 121 mg/dL (ref <130)
Total CHOL/HDL Ratio: 4.8 Ratio (ref <=5.0)
Triglycerides: 244 mg/dL — ABNORMAL HIGH (ref <150)
VLDL: 49 mg/dL — ABNORMAL HIGH (ref <30)

## 2016-02-11 LAB — BASIC METABOLIC PANEL
BUN: 17 mg/dL (ref 7–25)
CO2: 27 mmol/L (ref 20–31)
Calcium: 10 mg/dL (ref 8.6–10.3)
Chloride: 100 mmol/L (ref 98–110)
Creat: 1.18 mg/dL (ref 0.70–1.25)
GLUCOSE: 86 mg/dL (ref 65–99)
POTASSIUM: 4.3 mmol/L (ref 3.5–5.3)
SODIUM: 137 mmol/L (ref 135–146)

## 2016-02-15 ENCOUNTER — Ambulatory Visit (INDEPENDENT_AMBULATORY_CARE_PROVIDER_SITE_OTHER): Payer: 59 | Admitting: Family Medicine

## 2016-02-15 ENCOUNTER — Encounter: Payer: Self-pay | Admitting: Family Medicine

## 2016-02-15 VITALS — BP 130/70 | HR 68 | Temp 98.7°F | Ht 68.0 in | Wt 212.0 lb

## 2016-02-15 DIAGNOSIS — M7061 Trochanteric bursitis, right hip: Secondary | ICD-10-CM | POA: Diagnosis not present

## 2016-02-15 DIAGNOSIS — L989 Disorder of the skin and subcutaneous tissue, unspecified: Secondary | ICD-10-CM | POA: Diagnosis not present

## 2016-02-15 MED ORDER — DICLOFENAC SODIUM 75 MG PO TBEC: 75.0000 mg | DELAYED_RELEASE_TABLET | Freq: Two times a day (BID) | ORAL | 2 refills | Status: DC

## 2016-02-15 NOTE — Progress Notes (Signed)
Pre visit review using our clinic review tool, if applicable. No additional management support is needed unless otherwise documented below in the visit note. 

## 2016-02-15 NOTE — Progress Notes (Signed)
   Subjective:    Patient ID: Gabriel Aguilar, male    DOB: Aug 16, 1950, 65 y.o.   MRN: [id number]  HPI Here for several issues. First he has some lesions on the face that are slowly growing and he wants them removed. Also he has had intermittent pains in the right lateral hip for the past year. No hx of trauma. This is usually worse after prolonged sitting, such as driving his car. Then after he gets out and walks around the pain eases up. Using Advil prn.    Review of Systems  Constitutional: Negative.   Respiratory: Negative.   Cardiovascular: Negative.   Musculoskeletal: Positive for arthralgias.       Objective:   Physical Exam  Constitutional: He is oriented to person, place, and time. He appears well-developed and well-nourished.  Cardiovascular: Normal rate, regular rhythm, normal heart sounds and intact distal pulses.   Pulmonary/Chest: Effort normal and breath sounds normal.  Musculoskeletal:  Tender over the right greater trochanter. The right hip has full ROM  Neurological: He is alert and oriented to person, place, and time.  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 He will return soon to have cryotherapy on his temple lesions.  Laurey Morale, MD

## 2016-02-16 NOTE — Telephone Encounter (Signed)
Gabriel Aguilar has this paperwork been received? Thanks.

## 2016-02-16 NOTE — Telephone Encounter (Signed)
Pt returned phone call, would like to know if forms have been received. Pt contact # [phone] ok to leave message at this number.Gabriel Aguilar

## 2016-02-17 NOTE — Telephone Encounter (Signed)
Letter generated for SG to sign - Jasmine to give to her  Will forward note to SG

## 2016-02-17 NOTE — Telephone Encounter (Signed)
Pt calling to check on status of paperwork, please advise.Gabriel Aguilar

## 2016-02-17 NOTE — Telephone Encounter (Signed)
OK to provide letter Pl ask [name] who saw him last to sign I last saw him 2016

## 2016-02-17 NOTE — Telephone Encounter (Signed)
Received fax. Pt is needing OV note, copy of original sleep study, annual report of CPAP use, compliance statement, and note from treating sleep physician.   The note from the provider will need to state: "Gabriel Aguilar is a patient of mine. He has been diagnosed with obstructive sleep apnea. The patient has been in compliance with CPAP use. He has obtained significant benefit from using CPAP. No significant daytime fatigue or sleepiness has been reported by the patient. 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 Pt aware this is in process and we will call him once this has been completed and is ready to be picked up.   Dr. Elsworth Soho please advise if ok to write letter based off of compliance report. Thanks.

## 2016-02-18 ENCOUNTER — Telehealth: Payer: Self-pay | Admitting: *Deleted

## 2016-02-18 DIAGNOSIS — Z79899 Other long term (current) drug therapy: Secondary | ICD-10-CM

## 2016-02-18 DIAGNOSIS — E785 Hyperlipidemia, unspecified: Secondary | ICD-10-CM

## 2016-02-18 MED ORDER — ATORVASTATIN CALCIUM 80 MG PO TABS: 80.0000 mg | ORAL_TABLET | Freq: Every day | ORAL | 3 refills | Status: DC

## 2016-02-18 NOTE — Telephone Encounter (Signed)
-----   Message from [name], MD sent at 02/18/2016  3:33 PM EST ----- LDL is elevated.  I would suggest increasing the Lipitor to 80 mg daily and repeat liver studies and lipids in 10 weeks.   Call Mr. Cassani with the results and send results to Laurey Morale, MD

## 2016-02-18 NOTE — Telephone Encounter (Signed)
Per Janett Billow, SG will not sign letter until she sees a cpap download.   Download printed off of [employer], and letter and download are both hanging on corkboard over *[phone] desk in Triage  SG please advise.  Thanks!

## 2016-02-18 NOTE — Telephone Encounter (Signed)
Patient stopped by the clinic to see if the letter was ready for pick-up.the patient can be reached at [phone].Mearl Latin

## 2016-02-18 NOTE — Telephone Encounter (Signed)
Pt aware of his blood work and medication changes, fasting lipids liver ordered and mail to pt to get done in 10 weeks Rx has been sent to the pharmacy electronically. Atorvastatin 80 mg # 90 3 refills send into pt mail order pharmacy

## 2016-02-21 NOTE — Telephone Encounter (Signed)
I have reviewed the down load that I was given this morning.It shows 100% compliance with AHI of 0.3 . I  will sign the letter and we will forward it on to the patient. Thanks so much.

## 2016-02-23 ENCOUNTER — Encounter: Payer: Self-pay | Admitting: Family Medicine

## 2016-02-23 ENCOUNTER — Ambulatory Visit (INDEPENDENT_AMBULATORY_CARE_PROVIDER_SITE_OTHER): Payer: 59 | Admitting: Family Medicine

## 2016-02-23 VITALS — BP 134/76 | HR 65 | Temp 98.4°F | Ht 68.0 in | Wt 212.0 lb

## 2016-02-23 DIAGNOSIS — L821 Other seborrheic keratosis: Secondary | ICD-10-CM | POA: Diagnosis not present

## 2016-02-23 NOTE — Telephone Encounter (Signed)
Spoke with pt. Informed him the letter, compliance report, and OV note have been placed up front for pick up. Pt verbalized understanding and states he will pick it up on 11.16.17. Nothing further needed at this time.

## 2016-02-23 NOTE — Progress Notes (Signed)
   Subjective:    Patient ID: Gabriel Aguilar, male    DOB: 02-Mar-1951, 65 y.o.   MRN: [id number]  HPI Here to treat lesions on both temples that are consistent with seborrheic keratoses.    Review of Systems  Constitutional: Negative.        Objective:   Physical Exam  Constitutional: He appears well-developed and well-nourished.  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  Laurey Morale, MD

## 2016-02-23 NOTE — Progress Notes (Signed)
Pre visit review using our clinic review tool, if applicable. No additional management support is needed unless otherwise documented below in the visit note. 

## 2016-07-20 NOTE — Progress Notes (Signed)
Cardiology Office Note   Date:  07/21/2016   ID:  Gabriel Aguilar, DOB 08/01/1950, MRN [id number]  PCP:  Elyn Peers, MD  Cardiologist:   [name], MD   Chief Complaint  Patient presents with  . Shortness of Breath      History of Present Illness: Gabriel Aguilar is a 66 y.o. male who presents for follow-up of pericarditis. He was in the hospital with chest discomfort in January 2017. He was diagnosed with pericarditis.  He's had no recurrence of these symptoms since I last saw him. He does get a little short of breath with activities such as walking a moderate distance but is not having any resting shortness of breath, PND or orthopnea. He's not had any of the palpitations that he had at the time of his pericarditis.     Past Medical History:  Diagnosis Date  . Arthritis   . Erectile dysfunction   . GERD (gastroesophageal reflux disease)   . Hiatal hernia   . Hyperlipidemia   . Hypogonadism male    has seen  Dr. Orland Mustard  . Palpitation    a. montior 2 weeks 2014--> no arrhythmia  . Sleep apnea, central    wears a CPAP at night     Past Surgical History:  Procedure Laterality Date  . COLONOSCOPY  07-09-12   per Dr. Deatra Ina, adenomatous polyp, repeat in 5 yrs   . EYE SURGERY     LASIK  . 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 SPINE SURGERY  1994   disc at L4-L5  . VASECTOMY       Current Outpatient Prescriptions  Medication Sig Dispense Refill  . Ascorbic Acid (VITAMIN C) 1000 MG tablet Take 1,000 mg by mouth daily.    [name] aspirin EC 81 MG tablet Take 1 tablet (81 mg total) by mouth daily. 1 tablet 0  . Misc. Devices KIT by Does not apply route. CPAP machine    . Multiple Vitamin (MULTI VITAMIN PO) Take 1 tablet by mouth daily.    . OMEGA 3 1000 MG CAPS Take 2 capsules by mouth daily.    . vitamin E (VITAMIN E) 400 UNIT capsule Take 1,200 Units by mouth daily.    [name] atorvastatin (LIPITOR) 80 MG tablet Take 1 tablet (80 mg  total) by mouth daily at 6 PM. Take 1 tablet by mouth  daily 90 tablet 3  . omeprazole (PRILOSEC) 40 MG capsule Take 1 capsule (40 mg total) by mouth daily. 90 capsule 3   No current facility-administered medications for this visit.     Allergies:   Patient has no known allergies.    ROS:  Please see the history of present illness.   Otherwise, review of systems are positive for none.   All other systems are reviewed and negative.    PHYSICAL EXAM: VS:  BP 124/62   Pulse 65   Ht _0  (1.727 m)   Wt 211 lb (95.7 kg)   SpO2 97%   BMI 32.08 kg/m  , BMI Body mass index is 32.08 kg/m. GENERAL:  Well appearing, and in no distress HEENT:  Pupils equal round and reactive, fundi not visualized, oral mucosa unremarkable NECK:  No jugular venous distention, waveform within normal limits, carotid upstroke brisk and symmetric, no bruits, no thyromegaly LUNGS:  Clear to auscultation bilaterally CHEST:  Unremarkable HEART:  PMI not displaced or sustained,S1 and S2 within normal limits, no  S3, no S4, no clicks, no rubs, no murmurs, unchanged from previous. ABD:  Flat, positive bowel sounds normal in frequency in pitch, no bruits, no rebound, no guarding, no midline pulsatile mass, no hepatomegaly, no splenomegaly EXT:  2 plus pulses throughout, no edema, no cyanosis no clubbing    EKG:  EKG is  ordered today. The ekg ordered today demonstrates sinus rhythm, rate 53, axis within normal limits, intervals within normal limits, no acute ST-T wave.   Recent Labs: 02/10/2016: BUN 17; Creat 1.18; Hemoglobin 14.5; Platelets 338; Potassium 4.3; Sodium 137    Lipid Panel    Component Value Date/Time   CHOL 215 (H) 02/10/2016 1313   TRIG 244 (H) 02/10/2016 1313   HDL 45 02/10/2016 1313   CHOLHDL 4.8 02/10/2016 1313   VLDL 49 (H) 02/10/2016 1313   LDLCALC 121 02/10/2016 1313   LDLDIRECT 179.2 05/26/2013 0909      Wt Readings from Last 3 Encounters:  07/21/16 211 lb (95.7 kg)  02/23/16 212  lb (96.2 kg)  02/15/16 212 lb (96.2 kg)      Other studies Reviewed: Additional studies/ records that were reviewed today include: None Review of the above records demonstrates:      ASSESSMENT AND PLAN:  PERICARDITIS:   This is resolved.  No further testing is indicated.  He does not seem to have had any recurrence pericarditis.  HTN:  The blood pressure is at target. No change in medications is indicated. We will continue with therapeutic lifestyle changes (TLC).  We talked about therapeutic lifestyle changes  ATRIAL FIB:   I will place a Holter monitor per FAA regulation make sure there's been no recurrence of this though he's had none symptomatically.  SOB:     He's going to get an echocardiogram and a  POET (Plain Old Exercise Treadmill) to evaluate this.     Current medicines are reviewed at length with the patient today.  The patient does not have concerns regarding medicines.  The following changes have been made:  None  Labs/ tests ordered today include:  Echo, POET (Plain Old Exercise Treadmill), TSH, Holter    Disposition:   FU with me as needed.     Signed, [name], MD  07/21/2016 1:47 PM    [facility]

## 2016-07-21 ENCOUNTER — Ambulatory Visit (INDEPENDENT_AMBULATORY_CARE_PROVIDER_SITE_OTHER): Payer: Self-pay | Admitting: Cardiology

## 2016-07-21 ENCOUNTER — Encounter: Payer: Self-pay | Admitting: Cardiology

## 2016-07-21 VITALS — BP 124/62 | HR 65 | Ht 68.0 in | Wt 211.0 lb

## 2016-07-21 DIAGNOSIS — I4891 Unspecified atrial fibrillation: Secondary | ICD-10-CM

## 2016-07-21 DIAGNOSIS — R0600 Dyspnea, unspecified: Secondary | ICD-10-CM

## 2016-07-21 LAB — TSH: TSH: 0.92 mIU/L (ref 0.40–4.50)

## 2016-07-21 MED ORDER — OMEPRAZOLE 40 MG PO CPDR: 40.0000 mg | DELAYED_RELEASE_CAPSULE | Freq: Every day | ORAL | 3 refills | Status: DC

## 2016-07-21 MED ORDER — ATORVASTATIN CALCIUM 80 MG PO TABS: 80.0000 mg | ORAL_TABLET | Freq: Every day | ORAL | 3 refills | Status: DC

## 2016-07-21 NOTE — Patient Instructions (Addendum)
Medication Instructions:   Refill sent to the pharmacy electronically.   Labwork:  Your physician recommends that you HAVE LAB WORK TODAY  Testing/Procedures:  Your physician has requested that you have an echocardiogram. Echocardiography is a painless test that uses sound waves to create images of your heart. It provides your doctor with information about the size and shape of your heart and how well your heart's chambers and valves are working. This procedure takes approximately one hour. There are no restrictions for this procedure.   Your physician has recommended that you wear a 24 HOUR holter monitor. Holter monitors are medical devices that record the heart's electrical activity. Doctors most often use these monitors to diagnose arrhythmias. Arrhythmias are problems with the speed or rhythm of the heartbeat. The monitor is a small, portable device. You can wear one while you do your normal daily activities. This is usually used to diagnose what is causing palpitations/syncope (passing out).   Your physician has requested that you have an exercise tolerance test. For further information please visit [email or web address]. Please also follow instruction sheet, as given.    Follow-Up:  Your physician recommends that you schedule a follow-up appointment in: AS NEEDED PENDING TEST RESULTS      Exercise Stress Electrocardiogram An exercise stress electrocardiogram is a test that is done to evaluate the blood supply to your heart. This test may also be called exercise stress electrocardiography. The test is done while you are walking on a treadmill. The goal of this test is to raise your heart rate. This test is done to find areas of poor blood flow to the heart by determining the extent of coronary artery disease (CAD). CAD is defined as narrowing in one or more heart (coronary) arteries of more than 70%. If you have an abnormal test result, this may mean that you are not getting adequate  blood flow to your heart during exercise. Additional testing may be needed to understand why your test was abnormal. Tell a health care provider about:  Any allergies you have.  All medicines you are taking, including vitamins, herbs, eye drops, creams, and over-the-counter medicines.  Any problems you or family members have had with anesthetic medicines.  Any blood disorders you have.  Any surgeries you have had.  Any medical conditions you have.  Possibility of pregnancy, if this applies. What are the risks? Generally, this is a safe procedure. However, as with any procedure, complications can occur. Possible complications can include:  Pain or pressure in the following areas:  Chest.  Jaw or neck.  Between your shoulder blades.  Radiating down your left arm.  Dizziness or light-headedness.  Shortness of breath.  Increased or irregular heartbeats.  Nausea or vomiting.  Heart attack (rare). What happens before the procedure?  Avoid all forms of caffeine 24 hours before your test or as directed by your health care provider. This includes coffee, tea (even decaffeinated tea), caffeinated sodas, chocolate, cocoa, and certain pain medicines.  Follow your health care provider's instructions regarding eating and drinking before the test.  Take your medicines as directed at regular times with water unless instructed otherwise. Exceptions may include:  If you have diabetes, ask how you are to take your insulin or pills. It is common to adjust insulin dosing the morning of the test.  If you are taking beta-blocker medicines, it is important to talk to your health care provider about these medicines well before the date of your test. Taking beta-blocker medicines may interfere  with the test. In some cases, these medicines need to be changed or stopped 24 hours or more before the test.  If you wear a nitroglycerin patch, it may need to be removed prior to the test. Ask your  health care provider if the patch should be removed before the test.  If you use an inhaler for any breathing condition, bring it with you to the test.  If you are an outpatient, bring a snack so you can eat right after the stress phase of the test.  Do not smoke for 4 hours prior to the test or as directed by your health care provider.  Do not apply lotions, powders, creams, or oils on your chest prior to the test.  Wear loose-fitting clothes and comfortable shoes for the test. This test involves walking on a treadmill. What happens during the procedure?  Multiple patches (electrodes) will be put on your chest. If needed, small areas of your chest may have to be shaved to get better contact with the electrodes. Once the electrodes are attached to your body, multiple wires will be attached to the electrodes and your heart rate will be monitored.  Your heart will be monitored both at rest and while exercising.  You will walk on a treadmill. The treadmill will be started at a slow pace. The treadmill speed and incline will gradually be increased to raise your heart rate. What happens after the procedure?  Your heart rate and blood pressure will be monitored after the test.  You may return to your normal schedule including diet, activities, and medicines, unless your health care provider tells you otherwise. This information is not intended to replace advice given to you by your health care provider. Make sure you discuss any questions you have with your health care provider. Document Released: 03/24/2000 Document Revised: 09/02/2015 Document Reviewed: 12/02/2012 Elsevier Interactive Patient Education  2017 [employer].

## 2016-07-25 NOTE — Addendum Note (Signed)
Addended by: Milderd Meager on: 07/25/2016 01:35 PM   Modules accepted: Orders

## 2016-07-27 ENCOUNTER — Telehealth (HOSPITAL_COMMUNITY): Payer: Self-pay

## 2016-07-27 NOTE — Telephone Encounter (Signed)
Encounter complete. 

## 2016-08-01 ENCOUNTER — Inpatient Hospital Stay (HOSPITAL_COMMUNITY): Admission: RE | Admit: 2016-08-01 | Payer: 59 | Source: Ambulatory Visit

## 2016-08-03 ENCOUNTER — Other Ambulatory Visit (HOSPITAL_COMMUNITY): Payer: 59

## 2016-09-12 ENCOUNTER — Other Ambulatory Visit (HOSPITAL_COMMUNITY): Payer: Medicare Other

## 2016-09-12 ENCOUNTER — Telehealth: Payer: Self-pay | Admitting: Cardiology

## 2016-09-12 NOTE — Telephone Encounter (Signed)
Per Neoma Laming in billing we are out of network for the [employer] the patient has.  According to her we are NOT to do any of the testing you ordered for the patient GXT, Echo or monitor. I have cancelled the appts and removed the orders.  FYI   Thank you  Davy Pique

## 2016-09-13 ENCOUNTER — Telehealth: Payer: Self-pay | Admitting: Cardiology

## 2016-09-13 NOTE — Telephone Encounter (Signed)
I SPOKE WITH PATIENT 09/12/2016 . PATIENT HAS [employer] WHICH IS OUT OF OUR NETWORK. WHICH MEANS PATIENTS SERVICES WILL NOT BE PAID FOR BY HIS INSURANCE COMPANY. PATIENT IS GOING TO CONTACT HIS INSURANCE COMPANY AND FIND OUT WHAT PROVIDER IS WITHIN HIS NETWORK. PATIENT WILL GIVE AN UPDATE ONCE HE FINDS OUT.

## 2016-09-14 ENCOUNTER — Inpatient Hospital Stay (HOSPITAL_COMMUNITY): Admission: RE | Admit: 2016-09-14 | Payer: Medicare Other | Source: Ambulatory Visit

## 2016-09-15 ENCOUNTER — Telehealth: Payer: Self-pay | Admitting: Cardiology

## 2016-09-15 NOTE — Telephone Encounter (Signed)
PATIENT CALLED TO INFORM ME THAT DR Einar Gip  OFFICE IS WITHIN HIS NETWORK( [place] UP WITH HIS CARE THERE AND HOPEFULLY  PICK BACK UP WITH  DR Pierce   AFTER  HE LOOKS INTO  A DIFFERENT INSURANCE AT THE END OF THE YEAR . PATIENT DOES NEED  SOMEONE TO CALL HIM  ABOUT HIS RECORDS TO BE SENT TO DR Einar Gip.[name] OFFICE I WILL PASS THIS ON TO DR HOCHREIN'S COVERING CMA NYA THOMAS* APPT WITH DR Einar Gip   July  27TH @ 2:30

## 2016-09-18 ENCOUNTER — Telehealth: Payer: Self-pay | Admitting: *Deleted

## 2016-09-18 NOTE — Telephone Encounter (Signed)
Thanks

## 2016-09-18 NOTE — Telephone Encounter (Signed)
-----   Message from Lubertha Basque sent at 09/15/2016 10:05 AM EDT ----- Regarding: PATIENT NEEDS RECORDS SENT  PATIENT CALLED TO INFORM ME THAT DR Einar Gip  OFFICE IS WITHIN HIS NETWORK( [place] UP WITH HIS CARE THERE AND HOPEFULLY  PICK BACK UP WITH  DR [place]   AFTER  HE LOOKS INTO  A DIFFERENT INSURANCE AT THE END OF THE YEAR . PATIENT DOES NEED  SOMEONE TO CALL HIM  ABOUT HIS RECORDS TO BE SENT TO DR Einar Gip.[name] OFFICE I WILL PASS THIS ON TO DR HOCHREIN'S COVERING CMA Tiara Maultsby Margaux Engen# APPT WITH DR Einar Gip   July  27TH @ 2:30

## 2016-11-05 ENCOUNTER — Encounter: Payer: Self-pay | Admitting: Pulmonary Disease

## 2016-11-07 ENCOUNTER — Encounter: Payer: Self-pay | Admitting: Pulmonary Disease

## 2016-11-07 ENCOUNTER — Ambulatory Visit (INDEPENDENT_AMBULATORY_CARE_PROVIDER_SITE_OTHER): Payer: Medicare (Managed Care) | Admitting: Pulmonary Disease

## 2016-11-07 DIAGNOSIS — G4733 Obstructive sleep apnea (adult) (pediatric): Secondary | ICD-10-CM | POA: Diagnosis not present

## 2016-11-07 NOTE — Assessment & Plan Note (Signed)
CPAP was certainly helped him with his daytime somnolence and fatigue . His download shows excellent compliance and 8 cm with good control of events. We'll certainly give him supporting letter for coverage of his CPAP. It is not clear why his insurance has denied coverage  Advised against medications with sedative side effects Cautioned against driving when sleepy - understanding that sleepiness will vary on a day to day basis

## 2016-11-07 NOTE — Progress Notes (Signed)
   Subjective:    Patient ID: Gabriel Aguilar, male    DOB: 1951-03-07, 66 y.o.   MRN: [id number]  HPI 66 year old ex-smoker for FU of obstructive sleep apnea.   Moderate OSA was noted on a study in 2003 and he has been on CPAP since then. In 2017 regarding the new CPAP machine and Doppler was obtained showing excellent compliance. 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  He is noted good improvement in his daytime somnolence and fatigue. He denies any problems with mask or pressure. He uses nasal pillows and does not have nasal congestion. Download was reviewed today which shows excellent compliance more than 7 hours per night with no residuals and minimal leak.  He is undergoing cardiac evaluation as a part of his clearance for pilots license  For some reason he got a notice of denial of medical coverage for his insurance and he needs supporting letter from me  Weight is unchanged   Significant tests/ events reviewed  PSG 11/2001 showed AHI 20/h, predom apneas, longest 70 s, corrected by CPAP 8 cm   Review of Systems neg for any significant sore throat, dysphagia, itching, sneezing, nasal congestion or excess/ purulent secretions, fever, chills, sweats, unintended wt loss, pleuritic or exertional cp, hempoptysis, orthopnea pnd or change in chronic leg swelling. Also denies presyncope, palpitations, heartburn, abdominal pain, nausea, vomiting, diarrhea or change in bowel or urinary habits, dysuria,hematuria, rash, arthralgias, visual complaints, headache, numbness weakness or ataxia.     Objective:   Physical Exam  Gen. Pleasant, well-nourished, in no distress ENT - no thrush, no post nasal drip Neck: No JVD, no thyromegaly, no carotid bruits Lungs: no use of accessory muscles, no dullness to percussion, clear without rales or rhonchi  Cardiovascular: Rhythm regular, heart sounds  normal, no murmurs or gallops, no peripheral edema Musculoskeletal: No deformities, no cyanosis  or clubbing        Assessment & Plan:

## 2016-11-07 NOTE — Patient Instructions (Signed)
We will provide you supporting letter for coverage for CPAP  CPAP is set at 8 cm and seems to be working well

## 2016-11-08 ENCOUNTER — Encounter: Payer: Self-pay | Admitting: Pulmonary Disease

## 2016-11-21 ENCOUNTER — Telehealth: Payer: Self-pay | Admitting: Internal Medicine

## 2016-11-21 NOTE — Telephone Encounter (Signed)
Last OV from 11/07/16 has been faxed to [facility].

## 2016-12-06 ENCOUNTER — Telehealth: Payer: Self-pay | Admitting: Pulmonary Disease

## 2016-12-06 NOTE — Telephone Encounter (Signed)
Spoke with patient. He stated that in order for [employer] to cover his CPAP machine, they need to have documentation of compliance for the months of June and July 2018.   Advised patient that I would ask RA if he would be ok with this. Download as been printed for those months and placed on his desk for review.

## 2016-12-07 NOTE — Telephone Encounter (Signed)
Documented in my last OV

## 2016-12-07 NOTE — Telephone Encounter (Signed)
Spoke with pt, aware of documentation.  Nothing further needed at this time.

## 2017-03-07 ENCOUNTER — Encounter: Payer: Self-pay | Admitting: Internal Medicine

## 2017-03-14 ENCOUNTER — Encounter: Payer: Self-pay | Admitting: Internal Medicine

## 2017-03-14 ENCOUNTER — Ambulatory Visit: Payer: Medicare Other | Admitting: Internal Medicine

## 2017-03-14 VITALS — BP 150/70 | HR 65 | Ht 68.0 in | Wt 214.0 lb

## 2017-03-14 DIAGNOSIS — I4891 Unspecified atrial fibrillation: Secondary | ICD-10-CM

## 2017-03-14 NOTE — Patient Instructions (Signed)
Medication Instructions:  Your physician recommends that you continue on your current medications as directed. Please refer to the Current Medication list given to you today.  Labwork: None ordered.  Testing/Procedures: None ordered.  Follow-Up: Your physician recommends that you schedule a follow-up appointment as needed.   Any Other Special Instructions Will Be Listed Below (If Applicable).     If you need a refill on your cardiac medications before your next appointment, please call your pharmacy.   

## 2017-03-14 NOTE — Progress Notes (Signed)
Electrophysiology Office Note   Date:  03/14/2017   ID:  Gabriel Aguilar, DOB [birth year], MRN [id number]  PCP:  Lucianne Lei, MD  Cardiologist:  Dr Einar Gip Primary Electrophysiologist: Thompson Grayer, MD    CC: afib   History of Present Illness: Gabriel Aguilar is a 66 y.o. male who presents today for electrophysiology evaluation.   He is referred by Dr Einar Gip for second opinion related to afib.  The patient developed pericarditis in 207 and then had afib.  He has made good recovery and has had very little atrial fibrillation since that time.  He had a prior holter monitor which did not have afib documented.  He did have transient AV block without symptoms. He remains active.  Today, he denies symptoms of palpitations, chest pain, shortness of breath, orthopnea, PND, lower extremity edema, claudication, dizziness, presyncope, syncope, bleeding, or neurologic sequela. The patient is tolerating medications without difficulties and is otherwise without complaint today.    Past Medical History:  Diagnosis Date  . Arthritis   . Blood pressure elevated without history of HTN   . Erectile dysfunction   . GERD (gastroesophageal reflux disease)   . Hiatal hernia   . History of pericarditis   . Hyperlipidemia   . Hypogonadism male    has seen  Dr. Orland Mustard  . Palpitation    a. montior 2 weeks 2014--> no arrhythmia  . Paroxysmal atrial fibrillation (HCC)   . Sleep apnea, central    wears a CPAP at night    Past Surgical History:  Procedure Laterality Date  . BACK SURGERY  1994   L4-5  . COLONOSCOPY  07-09-12   per Dr. Deatra Ina, adenomatous polyp, repeat in 5 yrs   . EYE SURGERY     LASIK  . 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 SPINE SURGERY  1994   disc at L4-L5  . VASECTOMY       Current Outpatient Medications  Medication Sig Dispense Refill  . Ascorbic Acid (VITAMIN C) 1000 MG tablet Take 1,000 mg by mouth daily.    [name] atorvastatin (LIPITOR) 80  MG tablet Take 1 tablet (80 mg total) by mouth daily at 6 PM. Take 1 tablet by mouth  daily 90 tablet 3  . Misc. Devices KIT by Does not apply route. CPAP machine    . Multiple Vitamin (MULTI VITAMIN PO) Take 1 tablet by mouth daily.    . OMEGA 3 1000 MG CAPS Take 2 capsules by mouth daily.    [name] omeprazole (PRILOSEC) 40 MG capsule Take 1 capsule (40 mg total) by mouth daily. 90 capsule 3  . rivaroxaban (XARELTO) 20 MG TABS tablet Take 20 mg by mouth daily with supper.    . vitamin E (VITAMIN E) 400 UNIT capsule Take 1,200 Units by mouth daily.    . rosuvastatin (CRESTOR) 40 MG tablet Take 40 mg by mouth daily after supper.     No current facility-administered medications for this visit.     Allergies:   Patient has no known allergies.   Social History:  The patient  reports that he quit smoking about 34 years ago. He started smoking about 52 years ago. He smoked 1.50 packs per day. he has never used smokeless tobacco. He reports that he drinks alcohol. He reports that he does not use drugs.   Family History:  The patient's family history includes CVA in his brother and brother; Diabetes in his father;  Heart attack in his sister; Heart disease in his father; Heart disease (age of onset: 40) in his mother; Hyperlipidemia (age of onset: 37) in his father; Hypertension in his father; Kidney disease in his father; Other in his brother, sister, and sister.    ROS:  Please see the history of present illness.   All other systems are personally reviewed and negative.    PHYSICAL EXAM: VS:  BP (!) 150/70   Pulse 65   Ht _0  (1.727 m)   Wt 214 lb (97.1 kg)   BMI 32.54 kg/m  , BMI Body mass index is 32.54 kg/m. GEN: Well nourished, well developed, in no acute distress  HEENT: normal  Neck: no JVD, carotid bruits, or masses Cardiac: RRR; no murmurs, rubs, or gallops,no edema  Respiratory:  clear to auscultation bilaterally, normal work of breathing GI: soft, nontender, nondistended, + BS MS:  no deformity or atrophy  Skin: warm and dry  Neuro:  Strength and sensation are intact Psych: euthymic mood, full affect  EKG:  EKG is ordered today. The ekg ordered today is personally reviewed and shows sinus rhythm, normal ekg   Recent Labs: 07/21/2016: TSH 0.92  personally reviewed   Lipid Panel     Component Value Date/Time   CHOL 215 (H) 02/10/2016 1313   TRIG 244 (H) 02/10/2016 1313   HDL 45 02/10/2016 1313   CHOLHDL 4.8 02/10/2016 1313   VLDL 49 (H) 02/10/2016 1313   LDLCALC 121 02/10/2016 1313   LDLDIRECT 179.2 05/26/2013 0909   personally reviewed   Wt Readings from Last 3 Encounters:  03/14/17 214 lb (97.1 kg)  11/07/16 211 lb 12.8 oz (96.1 kg)  07/21/16 211 lb (95.7 kg)      Other studies personally reviewed: Additional studies/ records that were reviewed today include: records from Dr Irven Shelling office including prior echo and holter monitor  Review of the above records today demonstrates: as above   ASSESSMENT AND PLAN:  1.  Paroxysmal atrial fibrillation Currently well controlled He is appropriately anticoagulated for chads2vasc score of 2.  No indication for AAD therapy or ablation at this time.  If he develops symptoms of afib then we will reassess.  2. 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  I have spoken today at length with Dr Einar Gip about the patient.  He and I agree that the patient should be safe to have [place] flight approval.  No further cardiac testing or management is required at this time.  Army Fossa, MD  03/14/2017 9:37 AM     [facility] [address] [place] [address] [phone] (office) [phone] (fax)

## 2017-07-24 ENCOUNTER — Encounter: Payer: Self-pay | Admitting: Gastroenterology

## 2017-09-29 ENCOUNTER — Other Ambulatory Visit: Payer: Self-pay | Admitting: Cardiology

## 2017-10-01 NOTE — Telephone Encounter (Signed)
Rx request sent to pharmacy.  

## 2017-11-23 ENCOUNTER — Other Ambulatory Visit: Payer: Self-pay | Admitting: Cardiology

## 2018-05-10 ENCOUNTER — Telehealth: Payer: Self-pay

## 2018-05-10 NOTE — Telephone Encounter (Signed)
Author phoned pt. To offer to schedule initial awv. Appointment made for 2/3 after CPE with PCP.

## 2018-05-13 ENCOUNTER — Telehealth: Payer: Self-pay

## 2018-05-13 ENCOUNTER — Ambulatory Visit (INDEPENDENT_AMBULATORY_CARE_PROVIDER_SITE_OTHER): Payer: PPO | Admitting: Family Medicine

## 2018-05-13 ENCOUNTER — Ambulatory Visit (INDEPENDENT_AMBULATORY_CARE_PROVIDER_SITE_OTHER): Payer: PPO

## 2018-05-13 ENCOUNTER — Encounter: Payer: Self-pay | Admitting: Family Medicine

## 2018-05-13 VITALS — BP 138/70 | HR 81 | Temp 98.1°F | Ht 68.25 in | Wt 218.2 lb

## 2018-05-13 VITALS — BP 138/70 | HR 81 | Resp 20 | Ht 68.25 in | Wt 218.0 lb

## 2018-05-13 DIAGNOSIS — Z23 Encounter for immunization: Secondary | ICD-10-CM | POA: Diagnosis not present

## 2018-05-13 DIAGNOSIS — Z0000 Encounter for general adult medical examination without abnormal findings: Secondary | ICD-10-CM

## 2018-05-13 DIAGNOSIS — D72829 Elevated white blood cell count, unspecified: Secondary | ICD-10-CM | POA: Diagnosis not present

## 2018-05-13 DIAGNOSIS — Z125 Encounter for screening for malignant neoplasm of prostate: Secondary | ICD-10-CM

## 2018-05-13 MED ORDER — PNEUMOCOCCAL 13-VAL CONJ VACC IM SUSP
0.5000 mL | Freq: Once | INTRAMUSCULAR | Status: AC
  Administered 2018-05-13: 0.5 mL via INTRAMUSCULAR

## 2018-05-13 NOTE — Progress Notes (Signed)
   Subjective:    Patient ID: Gabriel Aguilar, male    DOB: 1951-02-19, 68 y.o.   MRN: [id number]  HPI Here for a well exam. He feels great. His atrial fibrillation is well controlled.    Review of Systems  Constitutional: Negative.   HENT: Negative.   Eyes: Negative.   Respiratory: Negative.   Cardiovascular: Negative.   Gastrointestinal: Negative.   Genitourinary: Negative.   Musculoskeletal: Negative.   Skin: Negative.   Neurological: Negative.   Psychiatric/Behavioral: Negative.        Objective:   Physical Exam Constitutional:      General: He is not in acute distress.    Appearance: He is well-developed. He is not diaphoretic.  HENT:     Head: Normocephalic and atraumatic.     Right Ear: External ear normal.     Left Ear: External ear normal.     Nose: Nose normal.     Mouth/Throat:     Pharynx: No oropharyngeal exudate.  Eyes:     General: No scleral icterus.       Right eye: No discharge.        Left eye: No discharge.     Conjunctiva/sclera: Conjunctivae normal.     Pupils: Pupils are equal, round, and reactive to light.  Neck:     Musculoskeletal: Neck supple.     Thyroid: No thyromegaly.     Vascular: No JVD.     Trachea: No tracheal deviation.  Cardiovascular:     Rate and Rhythm: Normal rate and regular rhythm.     Heart sounds: Normal heart sounds. No murmur. No friction rub. No gallop.   Pulmonary:     Effort: Pulmonary effort is normal. No respiratory distress.     Breath sounds: Normal breath sounds. No wheezing or rales.  Chest:     Chest wall: No tenderness.  Abdominal:     General: Bowel sounds are normal. There is no distension.     Palpations: Abdomen is soft. There is no mass.     Tenderness: There is no abdominal tenderness. There is no guarding or rebound.  Genitourinary:    Penis: Normal. No tenderness.      Prostate: Normal.     Rectum: Normal. Guaiac result negative.  Musculoskeletal: Normal range of motion.        General: No  tenderness.  Lymphadenopathy:     Cervical: No cervical adenopathy.  Skin:    General: Skin is warm and dry.     Coloration: Skin is not pale.     Findings: No erythema or rash.  Neurological:     Mental Status: He is alert and oriented to person, place, and time.     Cranial Nerves: No cranial nerve deficit.     Motor: No abnormal muscle tone.     Coordination: Coordination normal.     Deep Tendon Reflexes: Reflexes are normal and symmetric. Reflexes normal.  Psychiatric:        Behavior: Behavior normal.        Thought Content: Thought content normal.        Judgment: Judgment normal.           Assessment & Plan:  Well exam. We discussed diet and exercise. Set up fasting labs soon. He will contact the GI clinic to set up a colonoscopy soon.  Alysia Penna, MD

## 2018-05-13 NOTE — Patient Instructions (Addendum)
Follow up with GI and pulmonologist as planned.  Review the advance directive information given. Bring a copy of your living will and/or healthcare power of attorney to your next office visit.  Consider getting prevnar-13 and shingles vaccine (latter at local pharmacy), see VIS forms provided.  Refer to eye doctor providers.   Keep on staying active and hydrated (at least 32oz of non-caffeinated beverages daily)! Limit fried foods and high sodium foods to lose weight/improve triglycerides. See hand-outs provided. We'll see how your labs do. Call if any questions!   Gabriel Aguilar , Thank you for taking time to come for your Medicare Wellness Visit. I appreciate your ongoing commitment to your health goals. Please review the following plan we discussed and let me know if I can assist you in the future.   These are the goals we discussed: Goals    . Patient Stated     Lose 20-30 pounds by next year       This is a list of the screening recommended for you and due dates:  Health Maintenance  Topic Date Due  .  Hepatitis C: One time screening is recommended by Center for Disease Control  (CDC) for  adults born from [birth year] through 1967.   November 14, 1950  . Tetanus Vaccine  11/22/1967  . Colon Cancer Screening  07/08/2018*  . Pneumonia vaccines (1 of 2 - PCV13) 07/08/2018*  . Flu Shot  07/09/2018*  *Topic was postponed. The date shown is not the original due date.     Colonoscopy, Adult A colonoscopy is an exam to look at the entire large intestine. During the exam, a lubricated, flexible tube that has a camera on the end of it is inserted into the anus and then passed into the rectum, colon, and other parts of the large intestine. You may have a colonoscopy as a part of normal colorectal screening or if you have certain symptoms, such as:  Lack of red blood cells (anemia).  Diarrhea that does not go away.  Abdominal pain.  Blood in your stool (feces). A colonoscopy can help screen for  and diagnose medical problems, including:  Tumors.  Polyps.  Inflammation.  Areas of bleeding. Tell a health care provider about:  Any allergies you have.  All medicines you are taking, including vitamins, herbs, eye drops, creams, and over-the-counter medicines.  Any problems you or family members have had with anesthetic medicines.  Any blood disorders you have.  Any surgeries you have had.  Any medical conditions you have.  Any problems you have had passing stool. What are the risks? Generally, this is a safe procedure. However, problems may occur, including:  Bleeding.  A tear in the intestine.  A reaction to medicines given during the exam.  Infection (rare). What happens before the procedure? Eating and drinking restrictions Follow instructions from your health care provider about eating and drinking, which may include:  A few days before the procedure - follow a low-fiber diet. Avoid nuts, seeds, dried fruit, raw fruits, and vegetables.  1-3 days before the procedure - follow a clear liquid diet. Drink only clear liquids, such as clear broth or bouillon, black coffee or tea, clear juice, clear soft drinks or sports drinks, gelatin dessert, and popsicles. Avoid any liquids that contain red or purple dye.  On the day of the procedure - do not eat or drink anything starting 2 hours before the procedure, or within the time period that your health care provider recommends. Up to 2  hours before the procedure, you may continue to drink clear liquids, such as water or clear fruit juice. Bowel prep If you were prescribed an oral bowel prep to clean out your colon:  Take it as told by your health care provider. Starting the day before your procedure, you will need to drink a large amount of medicated liquid. The liquid will cause you to have multiple loose stools until your stool is almost clear or light green.  If your skin or anus gets irritated from diarrhea, you may  use these to relieve the irritation: ? Medicated wipes, such as adult wet wipes with aloe and vitamin E. ? A skin-soothing product like petroleum jelly.  If you vomit while drinking the bowel prep, take a break for up to 60 minutes and then begin the bowel prep again. If vomiting continues and you cannot take the bowel prep without vomiting, call your health care provider.  To clean out your colon, you may also be given: ? Laxative medicines. ? Instructions about how to use an enema. General instructions  Ask your health care provider about: ? Changing or stopping your regular medicines or supplements. This is especially important if you are taking iron supplements, diabetes medicines, or blood thinners. ? Taking medicines such as aspirin and ibuprofen. These medicines can thin your blood. Do not take these medicines before the procedure if your health care provider tells you not to.  Plan to have someone take you home from the hospital or clinic. What happens during the procedure?   An IV may be inserted into one of your veins.  You will be given medicine to help you relax (sedative).  To reduce your risk of infection: ? Your health care team will wash or sanitize their hands. ? Your anal area will be washed with soap.  You will be asked to lie on your side with your knees bent.  Your health care provider will lubricate a long, thin, flexible tube. The tube will have a camera and a light on the end.  The tube will be inserted into your anus.  The tube will be gently eased through your rectum and colon.  Air will be delivered into your colon to keep it open. You may feel some pressure or cramping.  The camera will be used to take images during the procedure.  A small tissue sample may be removed to be examined under a microscope (biopsy).  If small polyps are found, your health care provider may remove them and have them checked for cancer cells.  When the exam is done, the  tube will be removed. The procedure may vary among health care providers and hospitals. What happens after the procedure?  Your blood pressure, heart rate, breathing rate, and blood oxygen level will be monitored until the medicines you were given have worn off.  Do not drive for 24 hours after the exam.  You may have a small amount of blood in your stool.  You may pass gas and have mild abdominal cramping or bloating due to the air that was used to inflate your colon during the exam.  It is up to you to get the results of your procedure. Ask your health care provider, or the department performing the procedure, when your results will be ready. Summary  A colonoscopy is an exam to look at the entire large intestine.  During a colonoscopy, a lubricated, flexible tube with a camera on the end of it is inserted into  the anus and then passed into the colon and other parts of the large intestine.  Follow instructions from your health care provider about eating and drinking before the procedure.  If you were prescribed an oral bowel prep to clean out your colon, take it as told by your health care provider.  After your procedure, your blood pressure, heart rate, breathing rate, and blood oxygen level will be monitored until the medicines you were given have worn off. This information is not intended to replace advice given to you by your health care provider. Make sure you discuss any questions you have with your health care provider. Document Released: 03/24/2000 Document Revised: 01/17/2017 Document Reviewed: 06/08/2015 Elsevier Interactive Patient Education  2019 [place] Maintenance, Male A healthy lifestyle and preventive care is important for your health and wellness. Ask your health care provider about what schedule of regular examinations is right for you. What should I know about weight and diet? Eat a Healthy Diet  Eat plenty of vegetables, fruits, whole grains,  low-fat dairy products, and lean protein.  Do not eat a lot of foods high in solid fats, added sugars, or salt.  Maintain a Healthy Weight Regular exercise can help you achieve or maintain a healthy weight. You should:  Do at least 150 minutes of exercise each week. The exercise should increase your heart rate and make you sweat (moderate-intensity exercise).  Do strength-training exercises at least twice a week. Watch Your Levels of Cholesterol and Blood Lipids  Have your blood tested for lipids and cholesterol every 5 years starting at 70 years of age. If you are at high risk for heart disease, you should start having your blood tested when you are 70 years old. You may need to have your cholesterol levels checked more often if: ? Your lipid or cholesterol levels are high. ? You are older than 70 years of age. ? You are at high risk for heart disease. What should I know about cancer screening? Many types of cancers can be detected early and may often be prevented. Lung Cancer  You should be screened every year for lung cancer if: ? You are a current smoker who has smoked for at least 30 years. ? You are a former smoker who has quit within the past 15 years.  Talk to your health care provider about your screening options, when you should start screening, and how often you should be screened. Colorectal Cancer  Routine colorectal cancer screening usually begins at 70 years of age and should be repeated every 5-10 years until you are 70 years old. You may need to be screened more often if early forms of precancerous polyps or small growths are found. Your health care provider may recommend screening at an earlier age if you have risk factors for colon cancer.  Your health care provider may recommend using home test kits to check for hidden blood in the stool.  A small camera at the end of a tube can be used to examine your colon (sigmoidoscopy or colonoscopy). This checks for the  earliest forms of colorectal cancer. Prostate and Testicular Cancer  Depending on your age and overall health, your health care provider may do certain tests to screen for prostate and testicular cancer.  Talk to your health care provider about any symptoms or concerns you have about testicular or prostate cancer. Skin Cancer  Check your skin from head to toe regularly.  Tell your health care provider about  any new moles or changes in moles, especially if: ? There is a change in a mole's size, shape, or color. ? You have a mole that is larger than a pencil eraser.  Always use sunscreen. Apply sunscreen liberally and repeat throughout the day.  Protect yourself by wearing long sleeves, pants, a wide-brimmed hat, and sunglasses when outside. What should I know about heart disease, diabetes, and high blood pressure?  If you are 63-16 years of age, have your blood pressure checked every 3-5 years. If you are 82 years of age or older, have your blood pressure checked every year. You should have your blood pressure measured twice-once when you are at a hospital or clinic, and once when you are not at a hospital or clinic. Record the average of the two measurements. To check your blood pressure when you are not at a hospital or clinic, you can use: ? An automated blood pressure machine at a pharmacy. ? A home blood pressure monitor.  Talk to your health care provider about your target blood pressure.  If you are between 8-58 years old, ask your health care provider if you should take aspirin to prevent heart disease.  Have regular diabetes screenings by checking your fasting blood sugar level. ? If you are at a normal weight and have a low risk for diabetes, have this test once every three years after the age of 55. ? If you are overweight and have a high risk for diabetes, consider being tested at a younger age or more often.  A one-time screening for abdominal aortic aneurysm (AAA) by  ultrasound is recommended for men aged 65-75 years who are current or former smokers. What should I know about preventing infection? Hepatitis B If you have a higher risk for hepatitis B, you should be screened for this virus. Talk with your health care provider to find out if you are at risk for hepatitis B infection. Hepatitis C Blood testing is recommended for:  Everyone born from [birth year] through 1967.  Anyone with known risk factors for hepatitis C. Sexually Transmitted Diseases (STDs)  You should be screened each year for STDs including gonorrhea and chlamydia if: ? You are sexually active and are younger than 70 years of age. ? You are older than 70 years of age and your health care provider tells you that you are at risk for this type of infection. ? Your sexual activity has changed since you were last screened and you are at an increased risk for chlamydia or gonorrhea. Ask your health care provider if you are at risk.  Talk with your health care provider about whether you are at high risk of being infected with HIV. Your health care provider may recommend a prescription medicine to help prevent HIV infection. What else can I do?  Schedule regular health, dental, and eye exams.  Stay current with your vaccines (immunizations).  Do not use any tobacco products, such as cigarettes, chewing tobacco, and e-cigarettes. If you need help quitting, ask your health care provider.  Limit alcohol intake to no more than 2 drinks per day. One drink equals 12 ounces of beer, 5 ounces of wine, or 1 ounces of hard liquor.  Do not use street drugs.  Do not share needles.  Ask your health care provider for help if you need support or information about quitting drugs.  Tell your health care provider if you often feel depressed.  Tell your health care provider if you have ever  been abused or do not feel safe at home. This information is not intended to replace advice given to you by your  health care provider. Make sure you discuss any questions you have with your health care provider. Document Released: 09/23/2007 Document Revised: 11/24/2015 Document Reviewed: 12/29/2014 Elsevier Interactive Patient Education  2019 [employer].

## 2018-05-13 NOTE — Telephone Encounter (Signed)
Error

## 2018-05-13 NOTE — Progress Notes (Addendum)
Subjective:   Broc Caspers is a 68 y.o. male who presents for an Initial Medicare Annual Wellness Visit.  Review of Systems  No ROS.  Medicare Wellness Visit. Additional risk factors are reflected in the social history.  Cardiac Risk Factors include: male gender;hypertension;dyslipidemia;advanced age (>63mn, >>71women) Sleep patterns: has restless sleep, has frequent nighttime awakenings and has daytime sleepiness. Dr. FSarajane Jewsaware, and pt. Is to follow up with pulmonologist to help with nasal breathing difficulties while lying down, CPAP management.  Home Safety/Smoke Alarms: Feels safe in home. Smoke alarms in place.  Living environment; residence and Firearm Safety: 2-story house.No use or need for DME at this time.  Seat Belt Safety/Bike Helmet: Wears seat belt.   Male:   CCS- 2015, pt. Is aware that he is due for 5 year follow-up. Pt. To follow up with GI.     PSA- pt. To have PSA drawn 2/4, lab appointment made Lab Results  Component Value Date   PSA 1.61 03/19/2015   PSA 1.96 05/26/2013   PSA 1.66 05/14/2012      Objective:    Today's Vitals   05/13/18 1309  BP: 138/70  Pulse: 81  Resp: 20  SpO2: 97%  Weight: 218 lb (98.9 kg)  Height: 5' 8.25" (1.734 m)   Body mass index is 32.9 kg/m.  Advanced Directives 05/13/2018 05/11/2015  Does Patient Have a Medical Advance Directive? No No  Would patient like information on creating a medical advance directive? Yes (MAU/Ambulatory/Procedural Areas - Information given) No - patient declined information    Current Medications (verified) Outpatient Encounter Medications as of 05/13/2018  Medication Sig  . Ascorbic Acid (VITAMIN C) 1000 MG tablet Take 1,000 mg by mouth daily.  .Marland Kitchenatorvastatin (LIPITOR) 80 MG tablet TAKE 1 TABLET BY MOUTH DAILY AT 6PM  . Misc Natural Products (FOCUSED MIND PO) Take by mouth as needed.  . Misc. Devices KIT by Does not apply route. CPAP machine  . Multiple Vitamin (MULTI VITAMIN PO) Take 1  tablet by mouth daily.  . OMEGA 3 1000 MG CAPS Take 2 capsules by mouth daily.  .Marland Kitchenomega-3 acid ethyl esters (LOVAZA) 1 g capsule Take by mouth daily.  .Marland Kitchenomeprazole (PRILOSEC) 40 MG capsule TAKE 1 CAPSULE BY MOUTH DAILY  . rivaroxaban (XARELTO) 20 MG TABS tablet Take 20 mg by mouth daily with supper.  . vitamin B-12 (CYANOCOBALAMIN) 100 MCG tablet Take 100 mcg by mouth daily as needed.  . vitamin E (VITAMIN E) 400 UNIT capsule Take 1,200 Units by mouth daily.  . [DISCONTINUED] rosuvastatin (CRESTOR) 40 MG tablet Take 40 mg by mouth daily after supper.  . [EXPIRED] pneumococcal 13-valent conjugate vaccine (PREVNAR 13) injection 0.5 mL    No facility-administered encounter medications on file as of 05/13/2018.     Allergies (verified) Patient has no known allergies.   History: Past Medical History:  Diagnosis Date  . Arthritis   . Blood pressure elevated without history of HTN   . Erectile dysfunction   . GERD (gastroesophageal reflux disease)   . Hiatal hernia   . History of pericarditis   . Hyperlipidemia   . Hypogonadism male    has seen  Dr. POrland Mustard . Palpitation    a. montior 2 weeks 2014--> no arrhythmia  . Paroxysmal atrial fibrillation (HCC)    sees Dr. JAdrian Prows  . Sleep apnea, central    wears a CPAP at night    Past Surgical History:  Procedure Laterality Date  .  BACK SURGERY  1994   L4-5  . COLONOSCOPY  07-09-12   per Dr. Deatra Ina, adenomatous polyp, repeat in 5 yrs   . EYE SURGERY     LASIK  . 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 SPINE SURGERY  1994   disc at L4-L5  . VASECTOMY     Family History  Problem Relation Age of Onset  . Hyperlipidemia Father 22       KIDNEY FAILURE  . Heart disease Father   . Hypertension Father   . Kidney disease Father   . Diabetes Father   . Heart disease Mother 25  . Other Sister        LIVER FAILURE  . CVA Brother   . Other Sister        AT BIRTH  . Heart attack Sister   . CVA  Brother   . Other Brother        SEVERAL CABG  . Colon cancer Neg Hx    Social History   Socioeconomic History  . Marital status: Married    Spouse name: Not on file  . Number of children: 2  . Years of education: Not on file  . Highest education level: Not on file  Occupational History  . 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 Financial resource strain: Not hard at all  . Food insecurity:    Worry: Never true    Inability: Never true  . Transportation needs:    Medical: No    Non-medical: No  Tobacco Use  . Smoking status: Former Smoker    Packs/day: 1.50    Start date: 04/10/1964    Last attempt to quit: 03/06/1983    Years since quitting: 35.2  . Smokeless tobacco: Never Used  Substance and Sexual Activity  . Alcohol use: Yes    Alcohol/week: 1.0 standard drinks    Types: 1 Glasses of wine per week    Comment: rare  . Drug use: No  . Sexual activity: Not on file  Lifestyle  . Physical activity:    Days per week: 5 days    Minutes per session: 30 min  . Stress: Not at all  Relationships  . Social connections:    Talks on phone: Twice a week    Gets together: Once a week    Attends religious service: Not on file    Active member of club or organization: Yes    Attends meetings of clubs or organizations: More than 4 times per year    Relationship status: Married  Other Topics Concern  . Not on file  Social History Narrative   05/13/2018: Lives with wife in multi-level home, considering down-sizing in next few years, has two grown children.   Not quite ready to retire.   Loves to travel, planning [place] trip with wife in April   Goes to [employer], uses elliptical and weights   Tobacco Counseling Counseling given: Not Answered   Activities of Daily Living In your present state of health, do you have any difficulty performing the following activities: 05/13/2018 05/13/2018  Hearing? N N  Vision? N N  Difficulty concentrating  or making decisions? N Y  Walking or climbing stairs? N N  Dressing or bathing? N N  Doing errands, shopping? N N  Preparing Food and eating ? N -  Using the Toilet? N -  In  the past six months, have you accidently leaked urine? N -  Do you have problems with loss of bowel control? N -  Managing your Medications? N -  Managing your Finances? N -  Housekeeping or managing your Housekeeping? N -  Some recent data might be hidden     Immunizations and Health Maintenance Immunization History  Administered Date(s) Administered  . Influenza Split 02/22/2011  . Pneumococcal Conjugate-13 05/13/2018   Health Maintenance Due  Topic Date Due  . Hepatitis C Screening  Jan 16, 1951  . TETANUS/TDAP  11/22/1967    Patient Care Team: Laurey Morale, MD as PCP - General (Family Medicine) Adrian Prows, MD as Consulting Physician (Cardiology) Laurey Morale, MD as Consulting Physician (Family Medicine) Thompson Grayer, MD as Consulting Physician (Cardiology) Rigoberto Noel, MD as Consulting Physician (Pulmonary Disease)  Indicate any recent Medical Services you may have received from other than [facility] providers in the past year (date may be approximate).    Assessment:   This is a routine wellness examination for Lancer. Physical assessment deferred to PCP.   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 Pt. Declines any audiology referral, states does not affect hearing. Pt. Able to hear conversational tones without any difficulty. Dr. Sarajane Jews to be made aware nevertheless.  Vision Screening Comments: Wears glasses, no acute issues reported. Needs to establish with ophthamologist. List of local ophthamologists provided.  Dietary issues and exercise activities discussed: Current Exercise Habits: Structured exercise class, Type of exercise: strength training/weights;walking;Other - see comments(elliptical), Time (Minutes): 30,  Frequency (Times/Week): 5, Weekly Exercise (Minutes/Week): 150, Intensity: Moderate, Exercise limited by: cardiac condition(s);respiratory conditions(s) Diet (meal preparation, eat out, water [employer], caffeinated beverages, dairy products, fruits and vegetables): high fat/ cholesterol. Low salt, low fat diet recommendations reviewed with patient, and tips provided regarding limiting certain foods and choosing others, especially to consider while traveling. Increasing hydration emphasized.       Goals    . Patient Stated     Lose 20-30 pounds by next year      Depression Screen PHQ 2/9 Scores 05/13/2018 05/13/2018 01/27/2015  PHQ - 2 Score 0 0 0  PHQ- 9 Score 4 - -    Fall Risk Fall Risk  05/13/2018 05/13/2018 01/27/2015  Falls in the past year? 0 0 No     Cognitive Function:       Ad8 score reviewed for issues:  Issues making decisions: no  Less interest in hobbies / activities: no  Repeats questions, stories (family complaining): no  Trouble using ordinary gadgets (microwave, computer, phone):no  Forgets the month or year: no  Mismanaging finances: no  Remembering appts: no  Daily problems with thinking and/or memory: no Ad8 score is= 0   Screening Tests Health Maintenance  Topic Date Due  . Hepatitis C Screening  29-Aug-1950  . TETANUS/TDAP  11/22/1967  . COLONOSCOPY  07/08/2018 (Originally 07/09/2017)  . INFLUENZA VACCINE  07/09/2018 (Originally 11/08/2017)  . PNA vac Low Risk Adult (2 of 2 - PPSV23) 05/14/2019    Qualifies for Shingles Vaccine? Yes, but pt. Declines at this time. VIS sheet given for pt's review. Pt. Knows to go to local pharmacy if interested in receiving.  Pt. Received [id number] today.    Plan:    Follow up with GI and pulmonologist as planned.  Review the advance directive information given. Bring a copy of your living will and/or healthcare power of attorney to your  next office visit.  Consider getting shingles vaccine (at local pharmacy),  see VIS form provided.  Refer to eye doctor providers.   Keep on staying active and hydrated (at least 32oz of non-caffeinated beverages daily)! Limit fried foods and high sodium foods to lose weight/improve triglycerides. See hand-outs provided. We'll see how your labs do. Call if any questions!  I have personally reviewed and noted the following in the patient's chart:   . Medical and social history . Use of alcohol, tobacco or illicit drugs  . Current medications and supplements . Functional ability and status . Nutritional status . Physical activity . Advanced directives . List of other physicians . Vitals . Screenings to include cognitive, depression, and falls . Referrals and appointments  In addition, I have reviewed and discussed with patient certain preventive protocols, quality metrics, and best practice recommendations. A written personalized care plan for preventive services as well as general preventive health recommendations were provided to patient.     Alphia Moh, RN   05/13/2018    I have read this note and agree with its contents.  Alysia Penna, MD

## 2018-05-13 NOTE — Telephone Encounter (Signed)
During AWV, pt. Stated he has had ringing in both ears "for a long time" and wanted Dr. Sarajane Jews to be made aware. Pt. stated it does not bother him much, because he is so used to it, but nevertheless wanted Dr. Sarajane Jews to know.

## 2018-05-14 ENCOUNTER — Other Ambulatory Visit: Payer: PPO

## 2018-05-14 LAB — CBC WITH DIFFERENTIAL/PLATELET
BASOS ABS: 0 10*3/uL (ref 0.0–0.1)
Basophils Relative: 0.3 % (ref 0.0–3.0)
Eosinophils Absolute: 0.3 10*3/uL (ref 0.0–0.7)
Eosinophils Relative: 1.9 % (ref 0.0–5.0)
HCT: 41.4 % (ref 39.0–52.0)
HEMOGLOBIN: 14 g/dL (ref 13.0–17.0)
Lymphocytes Relative: 33.9 % (ref 12.0–46.0)
Lymphs Abs: 4.6 10*3/uL — ABNORMAL HIGH (ref 0.7–4.0)
MCHC: 33.9 g/dL (ref 30.0–36.0)
MCV: 90.8 fl (ref 78.0–100.0)
Monocytes Absolute: 0.9 10*3/uL (ref 0.1–1.0)
Monocytes Relative: 6.8 % (ref 3.0–12.0)
Neutro Abs: 7.8 10*3/uL — ABNORMAL HIGH (ref 1.4–7.7)
Neutrophils Relative %: 57.1 % (ref 43.0–77.0)
Platelets: 371 10*3/uL (ref 150.0–400.0)
RBC: 4.56 Mil/uL (ref 4.22–5.81)
RDW: 13.7 % (ref 11.5–15.5)
WBC: 13.6 10*3/uL — AB (ref 4.0–10.5)

## 2018-05-14 LAB — BASIC METABOLIC PANEL
BUN: 22 mg/dL (ref 6–23)
CHLORIDE: 102 meq/L (ref 96–112)
CO2: 27 mEq/L (ref 19–32)
Calcium: 9.6 mg/dL (ref 8.4–10.5)
Creatinine, Ser: 1.18 mg/dL (ref 0.40–1.50)
GFR: 61.12 mL/min (ref >60.00)
Glucose, Bld: 99 mg/dL (ref 70–99)
Potassium: 4.4 mEq/L (ref 3.5–5.1)
Sodium: 137 mEq/L (ref 135–145)

## 2018-05-14 LAB — POC URINALSYSI DIPSTICK (AUTOMATED)
Bilirubin, UA: NEGATIVE
Blood, UA: NEGATIVE
Glucose, UA: NEGATIVE
Ketones, UA: NEGATIVE
Leukocytes, UA: NEGATIVE
Nitrite, UA: NEGATIVE
Protein, UA: NEGATIVE
Spec Grav, UA: 1.015 (ref 1.010–1.025)
Urobilinogen, UA: 0.2 E.U./dL
pH, UA: 6.5 (ref 5.0–8.0)

## 2018-05-14 LAB — PSA: PSA: 2.48 ng/mL (ref 0.10–4.00)

## 2018-05-14 LAB — HEPATIC FUNCTION PANEL
ALBUMIN: 4.4 g/dL (ref 3.5–5.2)
ALT: 33 U/L (ref 0–53)
AST: 22 U/L (ref 0–37)
Alkaline Phosphatase: 60 U/L (ref 39–117)
Bilirubin, Direct: 0.1 mg/dL (ref 0.0–0.3)
Total Bilirubin: 0.6 mg/dL (ref 0.2–1.2)
Total Protein: 6.5 g/dL (ref 6.0–8.3)

## 2018-05-14 LAB — LIPID PANEL
Cholesterol: 172 mg/dL (ref 0–200)
HDL: 41.2 mg/dL (ref >39.00)
NonHDL: 130.47
Total CHOL/HDL Ratio: 4
Triglycerides: 249 mg/dL — ABNORMAL HIGH (ref 0.0–149.0)
VLDL: 49.8 mg/dL — ABNORMAL HIGH (ref 0.0–40.0)

## 2018-05-14 LAB — TSH: TSH: 0.89 u[IU]/mL (ref 0.35–4.50)

## 2018-05-14 LAB — LDL CHOLESTEROL, DIRECT: Direct LDL: 97 mg/dL

## 2018-05-14 NOTE — Addendum Note (Signed)
Addended by: Elmer Picker on: 05/14/2018 07:55 AM   Modules accepted: Orders

## 2018-05-15 NOTE — Telephone Encounter (Signed)
Noted  

## 2018-05-16 NOTE — Addendum Note (Signed)
Addended by: Alysia Penna A on: 05/16/2018 01:51 PM   Modules accepted: Orders

## 2018-05-27 ENCOUNTER — Inpatient Hospital Stay (HOSPITAL_COMMUNITY)
Admission: EM | Admit: 2018-05-27 | Discharge: 2018-05-30 | DRG: 247 | Disposition: A | Discharge status: Home / Self Care | Payer: PPO | Attending: Internal Medicine | Admitting: Internal Medicine

## 2018-05-27 ENCOUNTER — Encounter: Payer: Self-pay | Admitting: Family Medicine

## 2018-05-27 ENCOUNTER — Ambulatory Visit (INDEPENDENT_AMBULATORY_CARE_PROVIDER_SITE_OTHER): Payer: PPO | Admitting: Family Medicine

## 2018-05-27 ENCOUNTER — Encounter (HOSPITAL_COMMUNITY): Payer: Self-pay

## 2018-05-27 ENCOUNTER — Ambulatory Visit: Payer: Self-pay | Admitting: *Deleted

## 2018-05-27 ENCOUNTER — Telehealth: Payer: Self-pay | Admitting: Family Medicine

## 2018-05-27 ENCOUNTER — Emergency Department (HOSPITAL_COMMUNITY): Payer: PPO

## 2018-05-27 VITALS — BP 130/60 | HR 70 | Temp 98.3°F | Wt 220.1 lb

## 2018-05-27 DIAGNOSIS — R079 Chest pain, unspecified: Secondary | ICD-10-CM

## 2018-05-27 DIAGNOSIS — D72820 Lymphocytosis (symptomatic): Secondary | ICD-10-CM | POA: Diagnosis present

## 2018-05-27 DIAGNOSIS — E785 Hyperlipidemia, unspecified: Secondary | ICD-10-CM | POA: Diagnosis present

## 2018-05-27 DIAGNOSIS — I441 Atrioventricular block, second degree: Secondary | ICD-10-CM | POA: Diagnosis present

## 2018-05-27 DIAGNOSIS — R0602 Shortness of breath: Secondary | ICD-10-CM | POA: Diagnosis not present

## 2018-05-27 DIAGNOSIS — M1990 Unspecified osteoarthritis, unspecified site: Secondary | ICD-10-CM | POA: Diagnosis present

## 2018-05-27 DIAGNOSIS — Z8249 Family history of ischemic heart disease and other diseases of the circulatory system: Secondary | ICD-10-CM | POA: Diagnosis not present

## 2018-05-27 DIAGNOSIS — Z841 Family history of disorders of kidney and ureter: Secondary | ICD-10-CM

## 2018-05-27 DIAGNOSIS — Z8349 Family history of other endocrine, nutritional and metabolic diseases: Secondary | ICD-10-CM

## 2018-05-27 DIAGNOSIS — I319 Disease of pericardium, unspecified: Secondary | ICD-10-CM | POA: Diagnosis present

## 2018-05-27 DIAGNOSIS — G4733 Obstructive sleep apnea (adult) (pediatric): Secondary | ICD-10-CM | POA: Diagnosis not present

## 2018-05-27 DIAGNOSIS — R7303 Prediabetes: Secondary | ICD-10-CM | POA: Diagnosis present

## 2018-05-27 DIAGNOSIS — I2510 Atherosclerotic heart disease of native coronary artery without angina pectoris: Secondary | ICD-10-CM | POA: Diagnosis not present

## 2018-05-27 DIAGNOSIS — Z823 Family history of stroke: Secondary | ICD-10-CM

## 2018-05-27 DIAGNOSIS — Z87891 Personal history of nicotine dependence: Secondary | ICD-10-CM

## 2018-05-27 DIAGNOSIS — G4731 Primary central sleep apnea: Secondary | ICD-10-CM | POA: Diagnosis present

## 2018-05-27 DIAGNOSIS — Z905 Acquired absence of kidney: Secondary | ICD-10-CM

## 2018-05-27 DIAGNOSIS — I480 Paroxysmal atrial fibrillation: Secondary | ICD-10-CM | POA: Diagnosis present

## 2018-05-27 DIAGNOSIS — R778 Other specified abnormalities of plasma proteins: Secondary | ICD-10-CM | POA: Diagnosis present

## 2018-05-27 DIAGNOSIS — I25110 Atherosclerotic heart disease of native coronary artery with unstable angina pectoris: Secondary | ICD-10-CM | POA: Diagnosis present

## 2018-05-27 DIAGNOSIS — I10 Essential (primary) hypertension: Secondary | ICD-10-CM | POA: Diagnosis present

## 2018-05-27 DIAGNOSIS — I214 Non-ST elevation (NSTEMI) myocardial infarction: Secondary | ICD-10-CM | POA: Diagnosis present

## 2018-05-27 DIAGNOSIS — Z7982 Long term (current) use of aspirin: Secondary | ICD-10-CM

## 2018-05-27 DIAGNOSIS — R0609 Other forms of dyspnea: Secondary | ICD-10-CM | POA: Diagnosis not present

## 2018-05-27 DIAGNOSIS — E7800 Pure hypercholesterolemia, unspecified: Secondary | ICD-10-CM | POA: Diagnosis not present

## 2018-05-27 DIAGNOSIS — Z955 Presence of coronary angioplasty implant and graft: Secondary | ICD-10-CM

## 2018-05-27 DIAGNOSIS — K449 Diaphragmatic hernia without obstruction or gangrene: Secondary | ICD-10-CM | POA: Diagnosis present

## 2018-05-27 DIAGNOSIS — K219 Gastro-esophageal reflux disease without esophagitis: Secondary | ICD-10-CM | POA: Diagnosis present

## 2018-05-27 DIAGNOSIS — Z7901 Long term (current) use of anticoagulants: Secondary | ICD-10-CM

## 2018-05-27 DIAGNOSIS — Z833 Family history of diabetes mellitus: Secondary | ICD-10-CM | POA: Diagnosis not present

## 2018-05-27 DIAGNOSIS — E782 Mixed hyperlipidemia: Secondary | ICD-10-CM | POA: Diagnosis not present

## 2018-05-27 DIAGNOSIS — R7989 Other specified abnormal findings of blood chemistry: Secondary | ICD-10-CM

## 2018-05-27 DIAGNOSIS — D72829 Elevated white blood cell count, unspecified: Secondary | ICD-10-CM | POA: Diagnosis not present

## 2018-05-27 LAB — CBC
HCT: 43.1 % (ref 39.0–52.0)
Hemoglobin: 13.7 g/dL (ref 13.0–17.0)
MCH: 29.5 pg (ref 26.0–34.0)
MCHC: 31.8 g/dL (ref 30.0–36.0)
MCV: 92.9 fL (ref 80.0–100.0)
Platelets: 343 10*3/uL (ref 150–400)
RBC: 4.64 MIL/uL (ref 4.22–5.81)
RDW: 13.1 % (ref 11.5–15.5)
WBC: 12.7 10*3/uL — ABNORMAL HIGH (ref 4.0–10.5)
nRBC: 0 % (ref 0.0–0.2)

## 2018-05-27 LAB — BASIC METABOLIC PANEL
Anion gap: 10 (ref 5–15)
BUN: 19 mg/dL (ref 8–23)
CALCIUM: 9.5 mg/dL (ref 8.9–10.3)
CO2: 24 mmol/L (ref 22–32)
Chloride: 103 mmol/L (ref 98–111)
Creatinine, Ser: 1.16 mg/dL (ref 0.61–1.24)
GFR calc Af Amer: >60 mL/min (ref >60)
GFR calc non Af Amer: >60 mL/min (ref >60)
Glucose, Bld: 116 mg/dL — ABNORMAL HIGH (ref 70–99)
Potassium: 4.2 mmol/L (ref 3.5–5.1)
Sodium: 137 mmol/L (ref 135–145)

## 2018-05-27 LAB — I-STAT TROPONIN, ED: Troponin i, poc: 0.02 ng/mL (ref 0.00–0.08)

## 2018-05-27 LAB — PROTIME-INR
INR: 1.04
Prothrombin Time: 13.5 seconds (ref 11.4–15.2)

## 2018-05-27 MED ORDER — SODIUM CHLORIDE 0.9% FLUSH
3.0000 mL | Freq: Once | INTRAVENOUS | Status: AC
  Administered 2018-05-28: 3 mL via INTRAVENOUS

## 2018-05-27 NOTE — ED Triage Notes (Signed)
Patient complains of increasing weakness and exertional CP and SOB x 10-12 says. Was sent from primary MD for further evaluation. No pain on arrival, alert and oriented.

## 2018-05-27 NOTE — Progress Notes (Signed)
   Subjective:    Patient ID: Gabriel Aguilar, male    DOB: 09-23-1950, 68 y.o.   MRN: [id number]  HPI Here for 2 days of exertional chest pain and SOB. While walking up steps or picking up tree debris in his yard he felt left sided chest pressure and some SOB. No cough or fever. When he stops to rest these symptoms quickly resolve. He is concerned because he says he feels exactly the way he felt when he was diagnosed with pericarditis in January 2017. That resolved quickly. Follow up gated myocardial perfusion study on 11-13-16 was normal.  For the moment he feels fine.    Review of Systems  Constitutional: Negative.   Respiratory: Positive for shortness of breath. Negative for cough and wheezing.   Cardiovascular: Positive for chest pain. Negative for palpitations and leg swelling.  Gastrointestinal: Negative.        Objective:   Physical Exam Constitutional:      Appearance: Normal appearance. He is not ill-appearing.  Cardiovascular:     Rate and Rhythm: Normal rate and regular rhythm.     Pulses: Normal pulses.     Heart sounds: Normal heart sounds. No murmur. No friction rub.     Comments: EKG is normal  Pulmonary:     Effort: Pulmonary effort is normal. No respiratory distress.     Breath sounds: Normal breath sounds. No stridor. No wheezing, rhonchi or rales.  Lymphadenopathy:     Cervical: No cervical adenopathy.  Neurological:     Mental Status: He is alert.           Assessment & Plan:  Chest pain and SOB on exertion. He is stable today. He is on Xarelto. We will have him see Cardiology asap to evaluate.  Alysia Penna, MD

## 2018-05-27 NOTE — Telephone Encounter (Signed)
Noted pt is aware 

## 2018-05-27 NOTE — Telephone Encounter (Signed)
°  Per Dr [name] Office  The next available appointment for Dr [name]  is 05/30/2018@1 :77 if the pt need to be seen sooner their office is requesting that you or your CMA  To call to speak with the DOD on call   [phone] please advise is this ok for this date

## 2018-05-27 NOTE — Telephone Encounter (Signed)
  Reason for Disposition . Pain also present in shoulder(s) or arm(s) or jaw  (Exception: pain is clearly made worse by movement)    Pt has numbness in his left arm that is lingering.  Protocols used: CHEST PAIN-A-AH

## 2018-05-27 NOTE — Telephone Encounter (Signed)
Pt aware of message below

## 2018-05-27 NOTE — Telephone Encounter (Signed)
Pt is aware of his appt noted

## 2018-05-27 NOTE — Telephone Encounter (Signed)
Dr. Sarajane Jews stated that this date would be fine.  Thank you.

## 2018-05-27 NOTE — Telephone Encounter (Addendum)
Pt called with chest pain and numbness in his left arm . He was upstairs and decided to go sit in the recliner. States that he can still feel the numbness in his arm. The chest pain has gone down some.  No nausea. He took his xarelto and no other medication. He saw Dr. Sarajane Jews in the office this afternoon because he had been getting tired with exertion and thinks it may be pericarditis.  Has a hx of pericarditis. He has an appointment to see the cardiologist on the 20 th. Pt advised to go to the ED to be assessed. His wife will drive him. Routing to [facility] at [place].

## 2018-05-28 ENCOUNTER — Observation Stay (HOSPITAL_BASED_OUTPATIENT_CLINIC_OR_DEPARTMENT_OTHER): Payer: PPO

## 2018-05-28 ENCOUNTER — Encounter (HOSPITAL_COMMUNITY): Payer: Self-pay | Admitting: Internal Medicine

## 2018-05-28 ENCOUNTER — Other Ambulatory Visit: Payer: Self-pay

## 2018-05-28 DIAGNOSIS — R079 Chest pain, unspecified: Secondary | ICD-10-CM | POA: Diagnosis not present

## 2018-05-28 DIAGNOSIS — R7989 Other specified abnormal findings of blood chemistry: Secondary | ICD-10-CM

## 2018-05-28 DIAGNOSIS — K219 Gastro-esophageal reflux disease without esophagitis: Secondary | ICD-10-CM

## 2018-05-28 DIAGNOSIS — E785 Hyperlipidemia, unspecified: Secondary | ICD-10-CM

## 2018-05-28 DIAGNOSIS — R778 Other specified abnormalities of plasma proteins: Secondary | ICD-10-CM | POA: Diagnosis present

## 2018-05-28 DIAGNOSIS — D72829 Elevated white blood cell count, unspecified: Secondary | ICD-10-CM

## 2018-05-28 DIAGNOSIS — I480 Paroxysmal atrial fibrillation: Secondary | ICD-10-CM

## 2018-05-28 DIAGNOSIS — I10 Essential (primary) hypertension: Secondary | ICD-10-CM

## 2018-05-28 LAB — LIPID PANEL
Cholesterol: 171 mg/dL (ref 0–200)
HDL: 37 mg/dL — ABNORMAL LOW (ref >40)
LDL CALC: 73 mg/dL (ref 0–99)
Total CHOL/HDL Ratio: 4.6 RATIO
Triglycerides: 307 mg/dL — ABNORMAL HIGH (ref <150)
VLDL: 61 mg/dL — ABNORMAL HIGH (ref 0–40)

## 2018-05-28 LAB — CBC
HCT: 41.7 % (ref 39.0–52.0)
Hemoglobin: 13.7 g/dL (ref 13.0–17.0)
MCH: 30 pg (ref 26.0–34.0)
MCHC: 32.9 g/dL (ref 30.0–36.0)
MCV: 91.4 fL (ref 80.0–100.0)
Platelets: 299 10*3/uL (ref 150–400)
RBC: 4.56 MIL/uL (ref 4.22–5.81)
RDW: 13.1 % (ref 11.5–15.5)
WBC: 12.6 10*3/uL — ABNORMAL HIGH (ref 4.0–10.5)
nRBC: 0 % (ref 0.0–0.2)

## 2018-05-28 LAB — RAPID URINE DRUG SCREEN, HOSP PERFORMED
Amphetamines: NOT DETECTED
Barbiturates: NOT DETECTED
Benzodiazepines: NOT DETECTED
Cocaine: NOT DETECTED
OPIATES: NOT DETECTED
Tetrahydrocannabinol: NOT DETECTED

## 2018-05-28 LAB — BASIC METABOLIC PANEL
Anion gap: 9 (ref 5–15)
BUN: 16 mg/dL (ref 8–23)
CO2: 26 mmol/L (ref 22–32)
Calcium: 9.2 mg/dL (ref 8.9–10.3)
Chloride: 104 mmol/L (ref 98–111)
Creatinine, Ser: 1.12 mg/dL (ref 0.61–1.24)
GFR calc Af Amer: >60 mL/min (ref >60)
GFR calc non Af Amer: >60 mL/min (ref >60)
GLUCOSE: 97 mg/dL (ref 70–99)
Potassium: 4.2 mmol/L (ref 3.5–5.1)
Sodium: 139 mmol/L (ref 135–145)

## 2018-05-28 LAB — APTT: aPTT: 47 seconds — ABNORMAL HIGH (ref 24–36)

## 2018-05-28 LAB — TROPONIN I
Troponin I: 0.1 ng/mL (ref <0.03)
Troponin I: 0.08 ng/mL (ref <0.03)
Troponin I: 0.05 ng/mL (ref <0.03)

## 2018-05-28 LAB — ECHOCARDIOGRAM COMPLETE
HEIGHTINCHES: 68 in
Weight: 3470.92 oz

## 2018-05-28 LAB — HIV ANTIBODY (ROUTINE TESTING W REFLEX): HIV Screen 4th Generation wRfx: NONREACTIVE

## 2018-05-28 LAB — HEPARIN LEVEL (UNFRACTIONATED): Heparin Unfractionated: 1 IU/mL — ABNORMAL HIGH (ref 0.30–0.70)

## 2018-05-28 LAB — MRSA PCR SCREENING: MRSA by PCR: NEGATIVE

## 2018-05-28 MED ORDER — NITROGLYCERIN 0.4 MG SL SUBL: 0.4000 mg | SUBLINGUAL_TABLET | SUBLINGUAL | Status: DC | PRN

## 2018-05-28 MED ORDER — ASPIRIN 81 MG PO CHEW
324.0000 mg | CHEWABLE_TABLET | Freq: Once | ORAL | Status: AC
  Administered 2018-05-28: 324 mg via ORAL
  Filled 2018-05-28: qty 4

## 2018-05-28 MED ORDER — ASPIRIN EC 81 MG PO TBEC
81.0000 mg | DELAYED_RELEASE_TABLET | Freq: Every day | ORAL | Status: DC
  Administered 2018-05-28: 81 mg via ORAL
  Filled 2018-05-28: qty 1

## 2018-05-28 MED ORDER — ONDANSETRON HCL 4 MG/2ML IJ SOLN: 4.0000 mg | Freq: Four times a day (QID) | INTRAMUSCULAR | Status: DC | PRN

## 2018-05-28 MED ORDER — ASPIRIN EC 81 MG PO TBEC
81.0000 mg | DELAYED_RELEASE_TABLET | Freq: Every day | ORAL | Status: DC
  Administered 2018-05-29: 81 mg via ORAL
  Filled 2018-05-28: qty 1

## 2018-05-28 MED ORDER — RIVAROXABAN 20 MG PO TABS: 20.0000 mg | ORAL_TABLET | Freq: Every day | ORAL | Status: DC

## 2018-05-28 MED ORDER — OMEGA 3 1000 MG PO CAPS: 1.0000 | ORAL_CAPSULE | Freq: Every day | ORAL | Status: DC

## 2018-05-28 MED ORDER — SODIUM CHLORIDE 0.9% FLUSH: 3.0000 mL | INTRAVENOUS | Status: DC | PRN

## 2018-05-28 MED ORDER — ASPIRIN 81 MG PO CHEW: 81.0000 mg | CHEWABLE_TABLET | ORAL | Status: DC

## 2018-05-28 MED ORDER — VITAMIN C 500 MG PO TABS: 1000.0000 mg | ORAL_TABLET | Freq: Every day | ORAL | Status: DC

## 2018-05-28 MED ORDER — SENNOSIDES-DOCUSATE SODIUM 8.6-50 MG PO TABS: 1.0000 | ORAL_TABLET | Freq: Every evening | ORAL | Status: DC | PRN

## 2018-05-28 MED ORDER — MORPHINE SULFATE (PF) 2 MG/ML IV SOLN: 2.0000 mg | INTRAVENOUS | Status: DC | PRN

## 2018-05-28 MED ORDER — VITAMIN E 180 MG (400 UNIT) PO CAPS
1200.0000 [IU] | ORAL_CAPSULE | Freq: Every day | ORAL | Status: DC
  Filled 2018-05-28: qty 3

## 2018-05-28 MED ORDER — ATORVASTATIN CALCIUM 80 MG PO TABS
80.0000 mg | ORAL_TABLET | Freq: Every day | ORAL | Status: DC
  Administered 2018-05-28 – 2018-05-29 (×2): 80 mg via ORAL
  Filled 2018-05-28 (×2): qty 1

## 2018-05-28 MED ORDER — SODIUM CHLORIDE 0.9% FLUSH
3.0000 mL | Freq: Two times a day (BID) | INTRAVENOUS | Status: DC
  Administered 2018-05-28 – 2018-05-29 (×2): 3 mL via INTRAVENOUS

## 2018-05-28 MED ORDER — ONDANSETRON HCL 4 MG PO TABS: 4.0000 mg | ORAL_TABLET | Freq: Four times a day (QID) | ORAL | Status: DC | PRN

## 2018-05-28 MED ORDER — CARVEDILOL 3.125 MG PO TABS
3.1250 mg | ORAL_TABLET | Freq: Two times a day (BID) | ORAL | Status: DC
  Administered 2018-05-28 – 2018-05-29 (×2): 3.125 mg via ORAL
  Filled 2018-05-28 (×3): qty 1

## 2018-05-28 MED ORDER — PANTOPRAZOLE SODIUM 40 MG PO TBEC
40.0000 mg | DELAYED_RELEASE_TABLET | Freq: Every day | ORAL | Status: DC
  Administered 2018-05-28 – 2018-05-30 (×3): 40 mg via ORAL
  Filled 2018-05-28 (×3): qty 1

## 2018-05-28 MED ORDER — HEPARIN (PORCINE) 25000 UT/250ML-% IV SOLN
1400.0000 [IU]/h | INTRAVENOUS | Status: DC
  Administered 2018-05-28: 1400 [IU]/h via INTRAVENOUS
  Filled 2018-05-28: qty 250

## 2018-05-28 MED ORDER — ACETAMINOPHEN 325 MG PO TABS: 650.0000 mg | ORAL_TABLET | Freq: Four times a day (QID) | ORAL | Status: DC | PRN

## 2018-05-28 MED ORDER — SODIUM CHLORIDE 0.9 % IV SOLN: 250.0000 mL | INTRAVENOUS | Status: DC | PRN

## 2018-05-28 MED ORDER — OMEGA-3-ACID ETHYL ESTERS 1 G PO CAPS: 1.0000 g | ORAL_CAPSULE | Freq: Every day | ORAL | Status: DC

## 2018-05-28 MED ORDER — SODIUM CHLORIDE 0.9 % WEIGHT BASED INFUSION
1.0000 mL/kg/h | INTRAVENOUS | Status: DC
  Administered 2018-05-28: 1 mL/kg/h via INTRAVENOUS

## 2018-05-28 MED ORDER — ADULT MULTIVITAMIN W/MINERALS CH
ORAL_TABLET | Freq: Every day | ORAL | Status: DC
  Administered 2018-05-28 – 2018-05-30 (×3): 1 via ORAL
  Filled 2018-05-28 (×3): qty 1

## 2018-05-28 MED ORDER — ACETAMINOPHEN 650 MG RE SUPP: 650.0000 mg | Freq: Four times a day (QID) | RECTAL | Status: DC | PRN

## 2018-05-28 NOTE — Consult Note (Addendum)
The patient has been seen in conjunction with Gabriel Homes, MD. All aspects of care have been considered and discussed. The patient has been personally interviewed, examined, and all clinical data has been reviewed.   Unstable angina/NSTE - ACS based on clinical presentation.  Plan coronary angiography to define anatomy and help guide therapy.  The patient was counseled to undergo left heart catheterization, coronary angiography, and possible percutaneous coronary intervention with stent implantation. The procedural risks and benefits were discussed in detail. The risks discussed included death, stroke, myocardial infarction, life-threatening bleeding, limb ischemia, kidney injury, allergy, and possible emergency cardiac surgery. The risk of these significant complications were estimated to occur less than 1% of the time. After discussion, the patient has agreed to proceed.  CARDIOLOGY CONSULT NOTE    Patient ID: Gabriel Aguilar; [id number]; [birth year]   Admit date: 05/27/2018 Date of Consult: 05/28/2018  Primary Care Provider: Laurey Morale, MD Primary Cardiologist: Gabriel Breeding, MD Primary Electrophysiologist:  Gabriel Grayer, MD   Patient Profile:   Gabriel Aguilar is a 68 y.o. male with a hx of HLD, GERD, PAF, OSA, and pericarditis who is being seen today for the evaluation of chest pressure.  History of Present Illness:   Gabriel Aguilar was in his usual state of health until approximately one week ago when he began to experience progressive exertional dyspnea and chest pressure. He states that initially it started out with exertional fatigue however, approximately 3 to 4 days ago he began to have significant shortness of breath and substernal chest pressure with minimal exertion. This continued to progress until the night of admission when he was sitting at his office and he began to have substernal chest pressure radiating to the left arm. He subsequently called his wife and came to  the emergency department for further evaluation.  Prior to this past week he was able to do yard work and mow his own yard without any shortness of breath or chest pressure. He did experience one similar episode in 2017 when he was diagnosed with pericarditis and worries that this may be the same. He has had multiple brothers and sisters that have had MIs however they were all in their 29-70s. He has no early family history of cardiac disease. He is not been sick recently and denies fevers, chills, rhinorrhea, headaches, cough, myalgias, arthralgias. His shortness of breath and chest pressure were not associated with diaphoresis, palpitations, nausea, vomiting. He is not had any signs or symptoms of heart failure including orthopnea, PND, increased abdominal girth, lower extremity edema, decreased urine output. He does have lower extremity calf and foot pain with ambulation but states it improves if he bends over a shopping cart and walks or rests.  The patient is a previous smoker and quit over 30 years ago. He is an occasional alcohol user, one glass of wine occasionally. He denies the use of any recreational drugs. He is never had a left heart cath but did have a normal stress test in 2018.  Past Medical History:  Diagnosis Date  . Arthritis   . Blood pressure elevated without history of HTN   . Erectile dysfunction   . GERD (gastroesophageal reflux disease)   . Hiatal hernia   . History of pericarditis   . Hyperlipidemia   . Hypogonadism male    has seen  Dr. Orland Aguilar  . Palpitation    a. montior 2 weeks 2014--> no arrhythmia  . Paroxysmal atrial fibrillation [facility])    sees Dr.  Adrian Aguilar   . Sleep apnea, central    wears a CPAP at night    Past Surgical History:  Procedure Laterality Date  . BACK SURGERY  1994   L4-5  . COLONOSCOPY  07-09-12   per Dr. Deatra Gabriel, adenomatous polyp, repeat in 5 yrs   . EYE SURGERY     LASIK  . 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 SPINE SURGERY  1994   disc at L4-L5  . VASECTOMY      Home Medications:  Prior to Admission medications   Medication Sig Start Date End Date Taking? Authorizing Provider  Ascorbic Acid (VITAMIN C) 1000 MG tablet Take 1,000 mg by mouth daily.   Yes [provider]  atorvastatin (LIPITOR) 80 MG tablet TAKE 1 TABLET BY MOUTH DAILY AT 6PM Patient taking differently: Take 80 mg by mouth daily.  11/23/17  Yes Gabriel Breeding, MD  Misc Natural Products (FOCUSED MIND PO) Take 1 tablet by mouth daily.    Yes [provider]  Multiple Vitamin (MULTI VITAMIN PO) Take 1 tablet by mouth daily.   Yes [provider]  OMEGA 3 1000 MG CAPS Take 1 capsule by mouth daily.    Yes [provider]  omeprazole (PRILOSEC) 40 MG capsule TAKE 1 CAPSULE BY MOUTH DAILY Patient taking differently: Take 40 mg by mouth daily.  10/01/17  Yes Gabriel Breeding, MD  oxymetazoline (AFRIN) 0.05 % nasal spray Place 1 spray into both nostrils at bedtime.   Yes [provider]  rivaroxaban (XARELTO) 20 MG TABS tablet Take 20 mg by mouth daily with supper.   Yes [provider]  vitamin B-12 (CYANOCOBALAMIN) 100 MCG tablet Take 100 mcg by mouth daily as needed.   Yes [provider]  vitamin E (VITAMIN E) 400 UNIT capsule Take 1,200 Units by mouth daily.   Yes [provider]   Inpatient Medications: Scheduled Meds: . aspirin EC  81 mg Oral Daily  . atorvastatin  80 mg Oral q1800  . multivitamin with minerals   Oral Daily  . pantoprazole  40 mg Oral Daily   Continuous Infusions: . heparin     PRN Meds: acetaminophen **OR** acetaminophen, morphine injection, nitroGLYCERIN, ondansetron **OR** ondansetron (ZOFRAN) IV, senna-docusate  Allergies:   No Known Allergies  Social History:   Social History   Socioeconomic History  . Marital status: Married    Spouse name: Not on file  . Number of children: 2  . Years of education: Not on  file  . Highest education level: Not on file  Occupational History  . 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 Financial resource strain: Not hard at all  . Food insecurity:    Worry: Never true    Inability: Never true  . Transportation needs:    Medical: No    Non-medical: No  Tobacco Use  . Smoking status: Former Smoker    Packs/day: 1.50    Start date: 04/10/1964    Last attempt to quit: 03/06/1983    Years since quitting: 35.2  . Smokeless tobacco: Never Used  Substance and Sexual Activity  . Alcohol use: Yes    Alcohol/week: 1.0 standard drinks    Types: 1 Glasses of wine per week    Comment: rare  . Drug use: No  . Sexual activity: Not on file  Lifestyle  . Physical activity:  Days per week: 5 days    Minutes per session: 30 min  . Stress: Not at all  Relationships  . Social connections:    Talks on phone: Twice a week    Gets together: Once a week    Attends religious service: Not on file    Active member of club or organization: Yes    Attends meetings of clubs or organizations: More than 4 times per year    Relationship status: Married  . Intimate partner violence:    Fear of current or ex partner: No    Emotionally abused: No    Physically abused: No    Forced sexual activity: No  Other Topics Concern  . Not on file  Social History Narrative   05/13/2018: Lives with wife in multi-level home, considering down-sizing in next few years, has two grown children.   Not quite ready to retire.   Loves to travel, planning [place] trip with wife in April   Goes to [place], uses elliptical and weights    Family History:   Family History  Problem Relation Age of Onset  . Hyperlipidemia Father 22       KIDNEY FAILURE  . Heart disease Father   . Hypertension Father   . Kidney disease Father   . Diabetes Father   . Heart disease Mother 90  . Other Sister        LIVER FAILURE  . CVA Brother   . Other Sister        AT  BIRTH  . Heart attack Sister   . CVA Brother   . Other Brother        SEVERAL CABG  . Colon cancer Neg Hx      ROS:  Performed and all others negative.  Physical Exam/Data:   Vitals:   05/28/18 0500 05/28/18 0600 05/28/18 0700 05/28/18 0930  BP: (!) 146/67 (!) 120/58 124/66 136/68  Pulse: 64 65 (!) 56 63  Resp: 15 16 10 16   Temp:    98 F (36.7 C)  TempSrc:    Oral  SpO2: 91% 96% 97%   Weight:      Height:        Intake/Output Summary (Last 24 hours) at 05/28/2018 1518 Last data filed at 05/28/2018 0259 Gross per 24 hour  Intake 3 ml  Output -  Net 3 ml   Filed Weights   05/28/18 0412  Weight: 98.4 kg   Body mass index is 32.98 kg/m.   General:  Well nourished, well developed, in no acute distress HEENT: Normal Lymph: No adenopathy Neck: No JVD Endocrine:  No thryomegaly Vascular: No carotid bruits; FA pulses 2+ bilaterally without bruits  Cardiac:  Normal S1, S2; RRR; no murmur  Lungs:  Clear to auscultation bilaterally, no wheezing, rhonchi or rales  Abd: Soft, nontender, no hepatomegaly  Ext: No edema Musculoskeletal:  No deformities, BUE and BLE strength normal and equal Skin: Warm and dry  Neuro:  CNs 2-12 intact, no focal abnormalities noted Psych:  Normal affect   EKG:  The EKG was personally reviewed and demonstrates: Sinus rhythm. Borderline pathologic Q waves in leads I and aVL. No ST changes or new conduction abnormalities.   Telemetry:  Telemetry was personally reviewed and demonstrates: NSR  Relevant CV Studies:  Exercise stress (11/13/2016): Low risk   Laboratory Data:  Chemistry Recent Labs  Lab 05/27/18 1814 05/28/18 0647  NA 137 139  K 4.2 4.2  CL 103 104  CO2  24 26  GLUCOSE 116* 97  BUN 19 16  CREATININE 1.16 1.12  CALCIUM 9.5 9.2  GFRNONAA >60 >60  GFRAA >60 >60  ANIONGAP 10 9    No results for input(s): PROT, ALBUMIN, AST, ALT, ALKPHOS, BILITOT in the last 168 hours.   Hematology Recent Labs  Lab 05/27/18 1814  05/28/18 0647  WBC 12.7* 12.6*  RBC 4.64 4.56  HGB 13.7 13.7  HCT 43.1 41.7  MCV 92.9 91.4  MCH 29.5 30.0  MCHC 31.8 32.9  RDW 13.1 13.1  PLT 343 299   Cardiac Enzymes Recent Labs  Lab 05/28/18 0318 05/28/18 0647 05/28/18 1352  TROPONINI 0.10* 0.08* 0.05*    Recent Labs  Lab 05/27/18 1820  TROPIPOC 0.02    BNPNo results for input(s): BNP, PROBNP in the last 168 hours.  DDimer No results for input(s): DDIMER in the last 168 hours.  Radiology/Studies:  Dg Chest 2 View  Result Date: 05/27/2018 CLINICAL DATA:  Chest pain EXAM: CHEST - 2 VIEW COMPARISON:  05/11/2015 FINDINGS: The heart size and mediastinal contours are within normal limits. Both lungs are clear. The visualized skeletal structures are unremarkable. IMPRESSION: No active cardiopulmonary disease. Electronically Signed   By: Ulyses Jarred M.D.   On: 05/27/2018 19:44   Assessment and Plan:   Gabriel Aguilar is a 68 y.o. male with a hx of HLD, GERD, PAF, OSA, and pericarditis who is being seen today for the evaluation of chest pressure.  NSTE-ACS - Patient's symptoms are consistent with typical chest pain - Troponin peak at 0.1 now down trending - EKG without ischemic changes - Patient started on IV heparin per primary - Continue aspirin 81 mg and atorvastatin 80 mg - Follow-up echocardiogram  - I have reviewed the patient's CTA from 2017 that illustrated calcifications within the LAD and minimal calcifications in the RCA. Given patient's presentation and high probability of CAD would recommend taking this patient for diagnostic and therapeutic left heart catheterization - Aggressive pre-and post hydration. Patient only has one kidney. - Will give pre cath beta blocker and will need an ACE/ARB prior to DC  Hypertension  - Patient's blood pressure initially elevated at 181/86 on presentation - BP goal <130/80  - Start carvedilol and ACE/ARB prior to DC  Paroxysmal Atrial Fibrillation  - Not on any real rhythm  control - Anticoagulated with Xarelto. CHADsVASC of 2  - Holding Xarelto in anticipation of cath   Will discuss the case further with Dr. Tamala Julian   For questions or updates, please contact [place] Please consult [email or web address] for contact info under Cardiology/STEMI.   Signed, Gabriel Homes, MD 05/28/2018 3:18 PM

## 2018-05-28 NOTE — Progress Notes (Signed)
CRITICAL VALUE ALERT  Critical Value:  Troponin 0.10  Date & Time Notied:  05/28/2018 0430  Provider Notified: [facility]  Orders Received/Actions taken: Provider aware

## 2018-05-28 NOTE — Progress Notes (Signed)
  Echocardiogram 2D Echocardiogram has been performed.  Gabriel Aguilar 05/28/2018, 3:39 PM

## 2018-05-28 NOTE — Progress Notes (Addendum)
Patient was admitted overnight.  Agree with overall assessment and plan (See full H&P for further details).  Patient reports having intermittent episodes of shortness of breath with even minimal exertion.  Notes previous history of pericarditis in the past.  Order set already been placed to switched from Xarelto to heparin drip.  Troponin was elevated up to 0.1, but appear to be trending downward now.  Cardiology was formally consulted and have ordered for an echocardiogram to be checked.  Lipid panel revealed total cholesterol 171, HDL 37, LDL 73, and triglycerides 307.  Follow-up echocardiogram and cardiology consult for further recommendations.  Addendum: Plan for cath in a.m.

## 2018-05-28 NOTE — H&P (View-Only) (Signed)
The patient has been seen in conjunction with Ina Homes, MD. All aspects of care have been considered and discussed. The patient has been personally interviewed, examined, and all clinical data has been reviewed.   Unstable angina/NSTE - ACS based on clinical presentation.  Plan coronary angiography to define anatomy and help guide therapy.  The patient was counseled to undergo left heart catheterization, coronary angiography, and possible percutaneous coronary intervention with stent implantation. The procedural risks and benefits were discussed in detail. The risks discussed included death, stroke, myocardial infarction, life-threatening bleeding, limb ischemia, kidney injury, allergy, and possible emergency cardiac surgery. The risk of these significant complications were estimated to occur less than 1% of the time. After discussion, the patient has agreed to proceed.  CARDIOLOGY CONSULT NOTE    Patient ID: Gabriel Aguilar; [id number]; 10-07-1950   Admit date: 05/27/2018 Date of Consult: 05/28/2018  Primary Care Provider: Laurey Morale, MD Primary Cardiologist: [name], MD Primary Electrophysiologist:  Thompson Grayer, MD   Patient Profile:   Gabriel Aguilar is a 68 y.o. male with a hx of HLD, GERD, PAF, OSA, and pericarditis who is being seen today for the evaluation of chest pressure.  History of Present Illness:   Mr. Prewitt was in his usual state of health until approximately one week ago when he began to experience progressive exertional dyspnea and chest pressure. He states that initially it started out with exertional fatigue however, approximately 3 to 4 days ago he began to have significant shortness of breath and substernal chest pressure with minimal exertion. This continued to progress until the night of admission when he was sitting at his office and he began to have substernal chest pressure radiating to the left arm. He subsequently called his wife and came to  the emergency department for further evaluation.  Prior to this past week he was able to do yard work and mow his own yard without any shortness of breath or chest pressure. He did experience one similar episode in 2017 when he was diagnosed with pericarditis and worries that this may be the same. He has had multiple brothers and sisters that have had MIs however they were all in their 1-70s. He has no early family history of cardiac disease. He is not been sick recently and denies fevers, chills, rhinorrhea, headaches, cough, myalgias, arthralgias. His shortness of breath and chest pressure were not associated with diaphoresis, palpitations, nausea, vomiting. He is not had any signs or symptoms of heart failure including orthopnea, PND, increased abdominal girth, lower extremity edema, decreased urine output. He does have lower extremity calf and foot pain with ambulation but states it improves if he bends over a shopping cart and walks or rests.  The patient is a previous smoker and quit over 30 years ago. He is an occasional alcohol user, one glass of wine occasionally. He denies the use of any recreational drugs. He is never had a left heart cath but did have a normal stress test in 2018.  Past Medical History:  Diagnosis Date  . Arthritis   . Blood pressure elevated without history of HTN   . Erectile dysfunction   . GERD (gastroesophageal reflux disease)   . Hiatal hernia   . History of pericarditis   . Hyperlipidemia   . Hypogonadism male    has seen  Dr. Orland Mustard  . Palpitation    a. montior 2 weeks 2014--> no arrhythmia  . Paroxysmal atrial fibrillation [facility])    sees Dr.  Adrian Prows   . Sleep apnea, central    wears a CPAP at night    Past Surgical History:  Procedure Laterality Date  . BACK SURGERY  1994   L4-5  . COLONOSCOPY  07-09-12   per Dr. Deatra Ina, adenomatous polyp, repeat in 5 yrs   . EYE SURGERY     LASIK  . 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 SPINE SURGERY  1994   disc at L4-L5  . VASECTOMY      Home Medications:  Prior to Admission medications   Medication Sig Start Date End Date Taking? Authorizing Provider  Ascorbic Acid (VITAMIN C) 1000 MG tablet Take 1,000 mg by mouth daily.   Yes [provider]  atorvastatin (LIPITOR) 80 MG tablet TAKE 1 TABLET BY MOUTH DAILY AT 6PM Patient taking differently: Take 80 mg by mouth daily.  11/23/17  Yes [name], MD  Misc Natural Products (FOCUSED MIND PO) Take 1 tablet by mouth daily.    Yes [provider]  Multiple Vitamin (MULTI VITAMIN PO) Take 1 tablet by mouth daily.   Yes [provider]  OMEGA 3 1000 MG CAPS Take 1 capsule by mouth daily.    Yes [provider]  omeprazole (PRILOSEC) 40 MG capsule TAKE 1 CAPSULE BY MOUTH DAILY Patient taking differently: Take 40 mg by mouth daily.  10/01/17  Yes [name], MD  oxymetazoline (AFRIN) 0.05 % nasal spray Place 1 spray into both nostrils at bedtime.   Yes [provider]  rivaroxaban (XARELTO) 20 MG TABS tablet Take 20 mg by mouth daily with supper.   Yes [provider]  vitamin B-12 (CYANOCOBALAMIN) 100 MCG tablet Take 100 mcg by mouth daily as needed.   Yes [provider]  vitamin E (VITAMIN E) 400 UNIT capsule Take 1,200 Units by mouth daily.   Yes [provider]   Inpatient Medications: Scheduled Meds: . aspirin EC  81 mg Oral Daily  . atorvastatin  80 mg Oral q1800  . multivitamin with minerals   Oral Daily  . pantoprazole  40 mg Oral Daily   Continuous Infusions: . heparin     PRN Meds: acetaminophen **OR** acetaminophen, morphine injection, nitroGLYCERIN, ondansetron **OR** ondansetron (ZOFRAN) IV, senna-docusate  Allergies:   No Known Allergies  Social History:   Social History   Socioeconomic History  . Marital status: Married    Spouse name: Not on file  . Number of children: 2  . Years of education: Not on  file  . Highest education level: Not on file  Occupational History  . 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 Financial resource strain: Not hard at all  . Food insecurity:    Worry: Never true    Inability: Never true  . Transportation needs:    Medical: No    Non-medical: No  Tobacco Use  . Smoking status: Former Smoker    Packs/day: 1.50    Start date: 04/10/1964    Last attempt to quit: 03/06/1983    Years since quitting: 35.2  . Smokeless tobacco: Never Used  Substance and Sexual Activity  . Alcohol use: Yes    Alcohol/week: 1.0 standard drinks    Types: 1 Glasses of wine per week    Comment: rare  . Drug use: No  . Sexual activity: Not on file  Lifestyle  . Physical activity:  Days per week: 5 days    Minutes per session: 30 min  . Stress: Not at all  Relationships  . Social connections:    Talks on phone: Twice a week    Gets together: Once a week    Attends religious service: Not on file    Active member of club or organization: Yes    Attends meetings of clubs or organizations: More than 4 times per year    Relationship status: Married  . Intimate partner violence:    Fear of current or ex partner: No    Emotionally abused: No    Physically abused: No    Forced sexual activity: No  Other Topics Concern  . Not on file  Social History Narrative   05/13/2018: Lives with wife in multi-level home, considering down-sizing in next few years, has two grown children.   Not quite ready to retire.   Loves to travel, planning [place] trip with wife in April   Goes to [place], uses elliptical and weights    Family History:   Family History  Problem Relation Age of Onset  . Hyperlipidemia Father 55       KIDNEY FAILURE  . Heart disease Father   . Hypertension Father   . Kidney disease Father   . Diabetes Father   . Heart disease Mother 48  . Other Sister        LIVER FAILURE  . CVA Brother   . Other Sister        AT  BIRTH  . Heart attack Sister   . CVA Brother   . Other Brother        SEVERAL CABG  . Colon cancer Neg Hx      ROS:  Performed and all others negative.  Physical Exam/Data:   Vitals:   05/28/18 0500 05/28/18 0600 05/28/18 0700 05/28/18 0930  BP: (!) 146/67 (!) 120/58 124/66 136/68  Pulse: 64 65 (!) 56 63  Resp: 15 16 10 16   Temp:    98 F (36.7 C)  TempSrc:    Oral  SpO2: 91% 96% 97%   Weight:      Height:        Intake/Output Summary (Last 24 hours) at 05/28/2018 1518 Last data filed at 05/28/2018 0259 Gross per 24 hour  Intake 3 ml  Output -  Net 3 ml   Filed Weights   05/28/18 0412  Weight: 98.4 kg   Body mass index is 32.98 kg/m.   General:  Well nourished, well developed, in no acute distress HEENT: Normal Lymph: No adenopathy Neck: No JVD Endocrine:  No thryomegaly Vascular: No carotid bruits; FA pulses 2+ bilaterally without bruits  Cardiac:  Normal S1, S2; RRR; no murmur  Lungs:  Clear to auscultation bilaterally, no wheezing, rhonchi or rales  Abd: Soft, nontender, no hepatomegaly  Ext: No edema Musculoskeletal:  No deformities, BUE and BLE strength normal and equal Skin: Warm and dry  Neuro:  CNs 2-12 intact, no focal abnormalities noted Psych:  Normal affect   EKG:  The EKG was personally reviewed and demonstrates: Sinus rhythm. Borderline pathologic Q waves in leads I and aVL. No ST changes or new conduction abnormalities.   Telemetry:  Telemetry was personally reviewed and demonstrates: NSR  Relevant CV Studies:  Exercise stress (11/13/2016): Low risk   Laboratory Data:  Chemistry Recent Labs  Lab 05/27/18 1814 05/28/18 0647  NA 137 139  K 4.2 4.2  CL 103 104  CO2  24 26  GLUCOSE 116* 97  BUN 19 16  CREATININE 1.16 1.12  CALCIUM 9.5 9.2  GFRNONAA >60 >60  GFRAA >60 >60  ANIONGAP 10 9    No results for input(s): PROT, ALBUMIN, AST, ALT, ALKPHOS, BILITOT in the last 168 hours.   Hematology Recent Labs  Lab 05/27/18 1814  05/28/18 0647  WBC 12.7* 12.6*  RBC 4.64 4.56  HGB 13.7 13.7  HCT 43.1 41.7  MCV 92.9 91.4  MCH 29.5 30.0  MCHC 31.8 32.9  RDW 13.1 13.1  PLT 343 299   Cardiac Enzymes Recent Labs  Lab 05/28/18 0318 05/28/18 0647 05/28/18 1352  TROPONINI 0.10* 0.08* 0.05*    Recent Labs  Lab 05/27/18 1820  TROPIPOC 0.02    BNPNo results for input(s): BNP, PROBNP in the last 168 hours.  DDimer No results for input(s): DDIMER in the last 168 hours.  Radiology/Studies:  Dg Chest 2 View  Result Date: 05/27/2018 CLINICAL DATA:  Chest pain EXAM: CHEST - 2 VIEW COMPARISON:  05/11/2015 FINDINGS: The heart size and mediastinal contours are within normal limits. Both lungs are clear. The visualized skeletal structures are unremarkable. IMPRESSION: No active cardiopulmonary disease. Electronically Signed   By: Ulyses Jarred M.D.   On: 05/27/2018 19:44   Assessment and Plan:   Egidio Lofgren is a 68 y.o. male with a hx of HLD, GERD, PAF, OSA, and pericarditis who is being seen today for the evaluation of chest pressure.  NSTE-ACS - Patient's symptoms are consistent with typical chest pain - Troponin peak at 0.1 now down trending - EKG without ischemic changes - Patient started on IV heparin per primary - Continue aspirin 81 mg and atorvastatin 80 mg - Follow-up echocardiogram  - I have reviewed the patient's CTA from 2017 that illustrated calcifications within the LAD and minimal calcifications in the RCA. Given patient's presentation and high probability of CAD would recommend taking this patient for diagnostic and therapeutic left heart catheterization - Aggressive pre-and post hydration. Patient only has one kidney. - Will give pre cath beta blocker and will need an ACE/ARB prior to DC  Hypertension  - Patient's blood pressure initially elevated at 181/86 on presentation - BP goal <130/80  - Start carvedilol and ACE/ARB prior to DC  Paroxysmal Atrial Fibrillation  - Not on any real rhythm  control - Anticoagulated with Xarelto. CHADsVASC of 2  - Holding Xarelto in anticipation of cath   Will discuss the case further with Dr. Tamala Julian   For questions or updates, please contact [place] Please consult [email or web address] for contact info under Cardiology/STEMI.   Signed, Ina Homes, MD 05/28/2018 3:18 PM

## 2018-05-28 NOTE — Plan of Care (Signed)

## 2018-05-28 NOTE — Plan of Care (Signed)

## 2018-05-28 NOTE — Progress Notes (Addendum)
ANTICOAGULATION CONSULT NOTE - Initial Consult  Pharmacy Consult for Heparin Indication: NSTEMI  No Known Allergies  Patient Measurements: Height: 5\' 8"  (172.7 cm) Weight: 216 lb 14.9 oz (98.4 kg) IBW/kg (Calculated) : 68.4 Heparin Dosing Weight: 89 kg  Vital Signs: Temp: 98.1 F (36.7 C) (02/17 2241) Temp Source: Oral (02/17 2241) BP: 154/75 (02/18 0330) Pulse Rate: 58 (02/18 0330)  Labs: Recent Labs    05/27/18 1814 05/28/18 0318  HGB 13.7  --   HCT 43.1  --   PLT 343  --   LABPROT 13.5  --   INR 1.04  --   CREATININE 1.16  --   TROPONINI  --  0.10*    Estimated Creatinine Clearance: 68.3 mL/min (by C-G formula based on SCr of 1.16 mg/dL).   Medical History: Past Medical History:  Diagnosis Date  . Arthritis   . Blood pressure elevated without history of HTN   . Erectile dysfunction   . GERD (gastroesophageal reflux disease)   . Hiatal hernia   . History of pericarditis   . Hyperlipidemia   . Hypogonadism male    has seen  Dr. Orland Mustard  . Palpitation    a. montior 2 weeks 2014--> no arrhythmia  . Paroxysmal atrial fibrillation (HCC)    sees Dr. Adrian Prows   . Sleep apnea, central    wears a CPAP at night     Medications:  Medications Prior to Admission  Medication Sig Dispense Refill Last Dose  . Ascorbic Acid (VITAMIN C) 1000 MG tablet Take 1,000 mg by mouth daily.   Past Week at Unknown time  . atorvastatin (LIPITOR) 80 MG tablet TAKE 1 TABLET BY MOUTH DAILY AT 6PM (Patient taking differently: Take 80 mg by mouth daily. ) 90 tablet 0 Past Week at Unknown time  . Misc Natural Products (FOCUSED MIND PO) Take 1 tablet by mouth daily.    Past Week at Unknown time  . Multiple Vitamin (MULTI VITAMIN PO) Take 1 tablet by mouth daily.   Past Week at Unknown time  . OMEGA 3 1000 MG CAPS Take 1 capsule by mouth daily.    Past Week at Unknown time  . omeprazole (PRILOSEC) 40 MG capsule TAKE 1 CAPSULE BY MOUTH DAILY (Patient taking differently: Take 40 mg by  mouth daily. ) 90 capsule 3 Past Week at Unknown time  . oxymetazoline (AFRIN) 0.05 % nasal spray Place 1 spray into both nostrils at bedtime.   Past Week at Unknown time  . rivaroxaban (XARELTO) 20 MG TABS tablet Take 20 mg by mouth daily with supper.   05/27/2018 at 1700  . vitamin B-12 (CYANOCOBALAMIN) 100 MCG tablet Take 100 mcg by mouth daily as needed.   Past Week at Unknown time  . vitamin E (VITAMIN E) 400 UNIT capsule Take 1,200 Units by mouth daily.   Past Week at Unknown time    Assessment: 68 y.o. M presents with CP, SOB. Pt on Xarelto PTA for PAF. Last dose 2/17 1700. To begin heparin for NSTEMI. Trop up to 0.1. CBC ok on admission.  Xarelto will likely be affecting heparin level so will utilize PTT for heparin dosing until levels correlating.  Goal of Therapy:  PTT 66-102 sec Heparin level 0.3-0.7 units/ml Monitor platelets by anticoagulation protocol: Yes   Plan:  Baseline PTT and heparin level At 1700 (~24 hours past last dose of Xarelto), begin heparin gtt at 1400 units/hr Will f/u PTT in 8 hours Daily heparin level, PTT, and CBC  Sherlon Handing, PharmD, BCPS Clinical pharmacist  **Pharmacist phone directory can now be found on [email or web address] ([email or web address]).  Listed under [place]. 05/28/2018,6:52 AM

## 2018-05-28 NOTE — ED Provider Notes (Signed)
TIME SEEN: 1:43 AM  CHIEF COMPLAINT: Chest pain and shortness of breath  HPI: Patient is a 68 year old male with history of hypertension, hyperlipidemia, paroxysmal atrial fibrillation on Xarelto who is followed by Dr. Einar Gip who presents to the emergency department with a week and a half of left-sided chest pain.  States symptoms worse with exertion and now coming on at rest today.  He also feels short of breath whenever he exerts himself which is abnormal for him.  States he has felt very fatigued.  Reports similar symptoms when he had pericarditis in 2017.  Describes the left-sided chest pain as a cramp-like pain that gives him discomfort in his left arm.  Currently pain-free.  No nausea, vomiting, dizziness, diaphoresis.  Last nuclear medicine stress test was in August 2018 which was normal and had an echocardiogram at that time as well which was normal with EF of 58%.  Seen by his primary care physician Dr. fried today who sent him to the emergency department for further evaluation.  ROS: See HPI Constitutional: no fever  Eyes: no drainage  ENT: no runny nose   Cardiovascular:   chest pain  Resp:  SOB  GI: no vomiting GU: no dysuria Integumentary: no rash  Allergy: no hives  Musculoskeletal: no leg swelling  Neurological: no slurred speech ROS otherwise negative  PAST MEDICAL HISTORY/PAST SURGICAL HISTORY:  Past Medical History:  Diagnosis Date  . Arthritis   . Blood pressure elevated without history of HTN   . Erectile dysfunction   . GERD (gastroesophageal reflux disease)   . Hiatal hernia   . History of pericarditis   . Hyperlipidemia   . Hypogonadism male    has seen  Dr. Orland Mustard  . Palpitation    a. montior 2 weeks 2014--> no arrhythmia  . Paroxysmal atrial fibrillation (HCC)    sees Dr. Adrian Prows   . Sleep apnea, central    wears a CPAP at night     MEDICATIONS:  Prior to Admission medications   Medication Sig Start Date End Date Taking? Authorizing Provider   Ascorbic Acid (VITAMIN C) 1000 MG tablet Take 1,000 mg by mouth daily.    [provider]  atorvastatin (LIPITOR) 80 MG tablet TAKE 1 TABLET BY MOUTH DAILY AT 6PM 11/23/17   [name], MD  Misc Natural Products (FOCUSED MIND PO) Take by mouth as needed.    [provider]  Misc. Devices KIT by Does not apply route. CPAP machine    [provider]  Multiple Vitamin (MULTI VITAMIN PO) Take 1 tablet by mouth daily.    [provider]  OMEGA 3 1000 MG CAPS Take 2 capsules by mouth daily.    [provider]  omega-3 acid ethyl esters (LOVAZA) 1 g capsule Take by mouth daily.    [provider]  omeprazole (PRILOSEC) 40 MG capsule TAKE 1 CAPSULE BY MOUTH DAILY 10/01/17   [name], MD  rivaroxaban (XARELTO) 20 MG TABS tablet Take 20 mg by mouth daily with supper.    [provider]  vitamin B-12 (CYANOCOBALAMIN) 100 MCG tablet Take 100 mcg by mouth daily as needed.    [provider]  vitamin E (VITAMIN E) 400 UNIT capsule Take 1,200 Units by mouth daily.    [provider]    ALLERGIES:  No Known Allergies  SOCIAL HISTORY:  Social History   Tobacco Use  . Smoking status: Former Smoker    Packs/day: 1.50    Start date:  04/10/1964    Last attempt to quit: 03/06/1983    Years since quitting: 35.2  . Smokeless tobacco: Never Used  Substance Use Topics  . Alcohol use: Yes    Alcohol/week: 1.0 standard drinks    Types: 1 Glasses of wine per week    Comment: rare    FAMILY HISTORY: Family History  Problem Relation Age of Onset  . Hyperlipidemia Father 61       KIDNEY FAILURE  . Heart disease Father   . Hypertension Father   . Kidney disease Father   . Diabetes Father   . Heart disease Mother 22  . Other Sister        LIVER FAILURE  . CVA Brother   . Other Sister        AT BIRTH  . Heart attack Sister   . CVA Brother   . Other Brother        SEVERAL CABG  . Colon cancer Neg Hx      EXAM: BP (!) 143/80 (BP Location: Right Arm)   Pulse (!) 55   Temp 98.1 F (36.7 C) (Oral)   Resp 16   SpO2 98%  CONSTITUTIONAL: Alert and oriented and responds appropriately to questions. Well-appearing; well-nourished HEAD: Normocephalic EYES: Conjunctivae clear, pupils appear equal, EOMI ENT: normal nose; moist mucous membranes NECK: Supple, no meningismus, no nuchal rigidity, no LAD  CARD: RRR; S1 and S2 appreciated; no murmurs, no clicks, no rubs, no gallops RESP: Normal chest excursion without splinting or tachypnea; breath sounds clear and equal bilaterally; no wheezes, no rhonchi, no rales, no hypoxia or respiratory distress, speaking full sentences ABD/GI: Normal bowel sounds; non-distended; soft, non-tender, no rebound, no guarding, no peritoneal signs, no hepatosplenomegaly BACK:  The back appears normal and is non-tender to palpation, there is no CVA tenderness EXT: Normal ROM in all joints; non-tender to palpation; no edema; normal capillary refill; no cyanosis, no calf tenderness or swelling    SKIN: Normal color for age and race; warm; no rash NEURO: Moves all extremities equally PSYCH: The patient's mood and manner are appropriate. Grooming and personal hygiene are appropriate.  MEDICAL DECISION MAKING: Patient here with concerning story for exertional chest pain that is progressively getting worse and now came on at rest.  Question possible unstable angina.  EKG shows no ischemic abnormality.  No signs of pericarditis.  His heart score is 5.  Chest x-ray is clear.  Troponin is negative.  Pain-free currently.  Doubt dissection or PE.  Have recommended admission for chest pain rule out, repeat echocardiogram and possible stress test.  Patient would like to discuss this with his wife further before deciding on possible admission.  ED PROGRESS: Patient now agrees to admission given we have discussed his heart score and risk for major cardiac event.  Will discuss with  hospitalist.   2:00 AM Discussed patient's case with hospitalist, Dr. Blaine Hamper.  I have recommended admission and patient (and family if present) agree with this plan. Admitting physician will place admission orders.   I reviewed all nursing notes, vitals, pertinent previous records, EKGs, lab and urine results, imaging (as available).       EKG Interpretation  Date/Time:  Monday May 27 2018 17:59:52 EST Ventricular Rate:  70 PR Interval:  200 QRS Duration: 92 QT Interval:  396 QTC Calculation: 427 R Axis:   1 Text Interpretation:  Normal sinus rhythm Normal ECG ST elevation in inferior and lateral leads has resolved compared to previous Confirmed by  Imara Standiford [phone]) on 05/28/2018 1:15:10 AM         Nazli Penn, Delice Bison, DO 05/28/18 0201

## 2018-05-28 NOTE — Progress Notes (Signed)
Patient is alert and oriented  X 4. Started on heparin drip. No complaints of chest pain. Able to ambulated with 1 person assist to bathroom with no shortness of breath. Scheduled to be NPO as of midnight 05/29/18 0001. Call bell within reach. Encouraged to report signs and symptoms to RN.

## 2018-05-28 NOTE — Telephone Encounter (Signed)
Pt admitted to [facility].  

## 2018-05-28 NOTE — Progress Notes (Deleted)
Cardiology Office Note   Date:  05/28/2018   ID:  Gabriel Aguilar, DOB 1950-05-17, MRN [id number]  PCP:  Laurey Morale, MD  Cardiologist:   [name], MD   No chief complaint on file.     History of Present Illness: Gabriel Aguilar is a 68 y.o. male who presents for follow-up of pericarditis. He was in the hospital with chest discomfort in January 2017. He was diagnosed with pericarditis.  He has also had PAF.  I last saw him in 2017 and he changed to another cardiologist because of insurance.  He was seen by Dr. Rayann Heman in our office in 2018.  He was having palpitations but he is not having documented fibrillation.  ***   He's had no recurrence of these symptoms since I last saw him. He does get a little short of breath with activities such as walking a moderate distance but is not having any resting shortness of breath, PND or orthopnea. He's not had any of the palpitations that he had at the time of his pericarditis.     Past Medical History:  Diagnosis Date  . Arthritis   . Blood pressure elevated without history of HTN   . Erectile dysfunction   . GERD (gastroesophageal reflux disease)   . Hiatal hernia   . History of pericarditis   . Hyperlipidemia   . Hypogonadism male    has seen  Dr. Orland Mustard  . Palpitation    a. montior 2 weeks 2014--> no arrhythmia  . Paroxysmal atrial fibrillation (HCC)    sees Dr. Adrian Prows   . Sleep apnea, central    wears a CPAP at night     Past Surgical History:  Procedure Laterality Date  . BACK SURGERY  1994   L4-5  . COLONOSCOPY  07-09-12   per Dr. Deatra Ina, adenomatous polyp, repeat in 5 yrs   . EYE SURGERY     LASIK  . 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 SPINE SURGERY  1994   disc at L4-L5  . VASECTOMY       No current facility-administered medications for this visit.    No current outpatient medications on file.   Facility-Administered Medications Ordered in Other Visits    Medication Dose Route Frequency Provider Last Rate Last Dose  . 0.9 %  sodium chloride infusion  250 mL Intravenous PRN Ina Homes, MD      . 0.9% sodium chloride infusion  1 mL/kg/hr Intravenous Continuous Helberg, Justin, MD      . acetaminophen (TYLENOL) tablet 650 mg  650 mg Oral Q6H PRN Ivor Costa, MD       Or  . acetaminophen (TYLENOL) suppository 650 mg  650 mg Rectal Q6H PRN Ivor Costa, MD      . Derrill Memo ON 05/29/2018] aspirin EC tablet 81 mg  81 mg Oral Daily Helberg, Justin, MD      . atorvastatin (LIPITOR) tablet 80 mg  80 mg Oral q1800 Ivor Costa, MD   80 mg at 05/28/18 1747  . carvedilol (COREG) tablet 3.125 mg  3.125 mg Oral BID WC Helberg, Justin, MD   3.125 mg at 05/28/18 1747  . heparin ADULT infusion 100 units/mL (25000 units/217mL sodium chloride 0.45%)  1,400 Units/hr Intravenous Continuous Franky Macho, RPH 14 mL/hr at 05/28/18 1744 1,400 Units/hr at 05/28/18 1744  . morphine 2 MG/ML injection 2 mg  2 mg Intravenous Q4H PRN Ivor Costa,  MD      . multivitamin with minerals tablet   Oral Daily Ivor Costa, MD   1 tablet at 05/28/18 0931  . nitroGLYCERIN (NITROSTAT) SL tablet 0.4 mg  0.4 mg Sublingual Q5 min PRN Ivor Costa, MD      . ondansetron [facility]) tablet 4 mg  4 mg Oral Q6H PRN Ivor Costa, MD       Or  . ondansetron (ZOFRAN) injection 4 mg  4 mg Intravenous Q6H PRN Ivor Costa, MD      . pantoprazole (PROTONIX) EC tablet 40 mg  40 mg Oral Daily Ivor Costa, MD   40 mg at 05/28/18 0931  . senna-docusate (Senokot-S) tablet 1 tablet  1 tablet Oral QHS PRN Ivor Costa, MD      . sodium chloride flush (NS) 0.9 % injection 3 mL  3 mL Intravenous Q12H Helberg, Justin, MD      . sodium chloride flush (NS) 0.9 % injection 3 mL  3 mL Intravenous PRN Ina Homes, MD        Allergies:   Patient has no known allergies.    ROS:  Please see the history of present illness.   Otherwise, review of systems are positive for ***.   All other systems are reviewed and negative.     PHYSICAL EXAM: VS:  There were no vitals taken for this visit. , BMI There is no height or weight on file to calculate BMI. GENERAL:  Well appearing NECK:  No jugular venous distention, waveform within normal limits, carotid upstroke brisk and symmetric, no bruits, no thyromegaly LUNGS:  Clear to auscultation bilaterally CHEST:  Unremarkable HEART:  PMI not displaced or sustained,S1 and S2 within normal limits, no S3, no S4, no clicks, no rubs, *** murmurs ABD:  Flat, positive bowel sounds normal in frequency in pitch, no bruits, no rebound, no guarding, no midline pulsatile mass, no hepatomegaly, no splenomegaly EXT:  2 plus pulses throughout, no edema, no cyanosis no clubbing    ***GENERAL:  Well appearing, and in no distress HEENT:  Pupils equal round and reactive, fundi not visualized, oral mucosa unremarkable NECK:  No jugular venous distention, waveform within normal limits, carotid upstroke brisk and symmetric, no bruits, no thyromegaly LUNGS:  Clear to auscultation bilaterally CHEST:  Unremarkable HEART:  PMI not displaced or sustained,S1 and S2 within normal limits, no S3, no S4, no clicks, no rubs, no murmurs, unchanged from previous. ABD:  Flat, positive bowel sounds normal in frequency in pitch, no bruits, no rebound, no guarding, no midline pulsatile mass, no hepatomegaly, no splenomegaly EXT:  2 plus pulses throughout, no edema, no cyanosis no clubbing    EKG:  EKG is  *** ordered today. The ekg ordered today demonstrates sinus rhythm, rate ***, axis within normal limits, intervals within normal limits, no acute ST-T wave.   Recent Labs: 05/14/2018: ALT 33; TSH 0.89 05/28/2018: BUN 16; Creatinine, Ser 1.12; Hemoglobin 13.7; Platelets 299; Potassium 4.2; Sodium 139    Lipid Panel    Component Value Date/Time   CHOL 171 05/28/2018 0318   TRIG 307 (H) 05/28/2018 0318   HDL 37 (L) 05/28/2018 0318   CHOLHDL 4.6 05/28/2018 0318   VLDL 61 (H) 05/28/2018 0318    LDLCALC 73 05/28/2018 0318   LDLDIRECT 97.0 05/14/2018 0755      Wt Readings from Last 3 Encounters:  05/28/18 216 lb 14.9 oz (98.4 kg)  05/27/18 220 lb 2 oz (99.8 kg)  05/13/18 218 lb (98.9 kg)  Other studies Reviewed: Additional studies/ records that were reviewed today include: *** Review of the above records demonstrates:   ***   ASSESSMENT AND PLAN:  PERICARDITIS:   *** This is resolved.  No further testing is indicated.  He does not seem to have had any recurrence pericarditis.  HTN:  The blood pressure is *** at target. No change in medications is indicated. We will continue with therapeutic lifestyle changes (TLC).  We talked about therapeutic lifestyle changes  ATRIAL FIB:   ***    I will place a Holter monitor per FAA regulation make sure there's been no recurrence of this though he's had none symptomatically.  SOB:     He's going to get an echocardiogram and a  POET (Plain Old Exercise Treadmill) to evaluate this.     Current medicines are reviewed at length with the patient today.  The patient does not have concerns regarding medicines.  The following changes have been made:  None  Labs/ tests ordered today include:  Echo, POET (Plain Old Exercise Treadmill), TSH, Holter    Disposition:   FU with me as needed.     Signed, [name], MD  05/28/2018 8:46 PM    [place]

## 2018-05-28 NOTE — Progress Notes (Signed)
Patient and wife setting up home CPAP upon arrival. Cords safe. No assistance needed per patient. Informed him if he needed anything to have the RN call RT.

## 2018-05-28 NOTE — H&P (Signed)
History and Physical    Gabriel Aguilar YKZ:[id number] DOB: 01-Dec-1950 DOA: 05/27/2018  Referring MD/NP/PA:   PCP: Laurey Morale, MD   Patient coming from:  The patient is coming from home.  At baseline, pt is independent for most of ADL.        Chief Complaint: chest pain and shortness of breath  HPI: Gabriel Aguilar is a 68 y.o. male with medical history significant of hyperlipidemia, GERD, PAF on Xarelto, OSA on CPAP, BPH, pericarditis, hypogonadism, who presents with chest pain and shortness of breath.  Patient states that he has been having intermittent exertional shortness of breath for more than 10 days.  Today he had 2 episodes of exertional chest pain, which was located in the left central chest, 5 out of 10 in severity, short lasting, tightness and cramping-like pain, currently chest pain-free, radiating to left arm.  Denies tenderness in calf areas.  Patient does not have cough, fever or chills.  No nausea vomiting, diarrhea, abdominal pain, symptoms of UTI or unilateral weakness. Of note, pt had NM stress test 11/13/2016, which was negative.   ED Course: pt was found to have negative troponin, WBC 12.7, INR 1.04, electrolytes renal function okay, temperature normal, no tachycardia, oxygen saturation 98% on room air, negative chest x-ray.  Patient is placed on telemetry bed for observation.  Review of Systems:   General: no fevers, chills, no body weight gain, has fatigue HEENT: no blurry vision, hearing changes or sore throat Respiratory: has dyspnea, no coughing, wheezing CV: has chest pain, no palpitations GI: no nausea, vomiting, abdominal pain, diarrhea, constipation GU: no dysuria, burning on urination, increased urinary frequency, hematuria  Ext: no leg edema Neuro: no unilateral weakness, numbness, or tingling, no vision change or hearing loss Skin: no rash, no skin tear. MSK: No muscle spasm, no deformity, no limitation of range of movement in spin Heme: No easy bruising.   Travel history: No recent long distant travel.  Allergy: No Known Allergies  Past Medical History:  Diagnosis Date  . Arthritis   . Blood pressure elevated without history of HTN   . Erectile dysfunction   . GERD (gastroesophageal reflux disease)   . Hiatal hernia   . History of pericarditis   . Hyperlipidemia   . Hypogonadism male    has seen  Dr. Orland Mustard  . Palpitation    a. montior 2 weeks 2014--> no arrhythmia  . Paroxysmal atrial fibrillation (HCC)    sees Dr. Adrian Prows   . Sleep apnea, central    wears a CPAP at night     Past Surgical History:  Procedure Laterality Date  . BACK SURGERY  1994   L4-5  . COLONOSCOPY  07-09-12   per Dr. Deatra Ina, adenomatous polyp, repeat in 5 yrs   . EYE SURGERY     LASIK  . 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 SPINE SURGERY  1994   disc at L4-L5  . VASECTOMY      Social History:  reports that he quit smoking about 35 years ago. He started smoking about 54 years ago. He smoked 1.50 packs per day. He has never used smokeless tobacco. He reports current alcohol use of about 1.0 standard drinks of alcohol per week. He reports that he does not use drugs.  Family History:  Family History  Problem Relation Age of Onset  . Hyperlipidemia Father 21       KIDNEY FAILURE  .  Heart disease Father   . Hypertension Father   . Kidney disease Father   . Diabetes Father   . Heart disease Mother 27  . Other Sister        LIVER FAILURE  . CVA Brother   . Other Sister        AT BIRTH  . Heart attack Sister   . CVA Brother   . Other Brother        SEVERAL CABG  . Colon cancer Neg Hx      Prior to Admission medications   Medication Sig Start Date End Date Taking? Authorizing Provider  Ascorbic Acid (VITAMIN C) 1000 MG tablet Take 1,000 mg by mouth daily.    [provider]  atorvastatin (LIPITOR) 80 MG tablet TAKE 1 TABLET BY MOUTH DAILY AT 6PM 11/23/17   [name], MD  Misc Natural  Products (FOCUSED MIND PO) Take by mouth as needed.    [provider]  Misc. Devices KIT by Does not apply route. CPAP machine    [provider]  Multiple Vitamin (MULTI VITAMIN PO) Take 1 tablet by mouth daily.    [provider]  OMEGA 3 1000 MG CAPS Take 2 capsules by mouth daily.    [provider]  omega-3 acid ethyl esters (LOVAZA) 1 g capsule Take by mouth daily.    [provider]  omeprazole (PRILOSEC) 40 MG capsule TAKE 1 CAPSULE BY MOUTH DAILY 10/01/17   [name], MD  rivaroxaban (XARELTO) 20 MG TABS tablet Take 20 mg by mouth daily with supper.    [provider]  vitamin B-12 (CYANOCOBALAMIN) 100 MCG tablet Take 100 mcg by mouth daily as needed.    [provider]  vitamin E (VITAMIN E) 400 UNIT capsule Take 1,200 Units by mouth daily.    [provider]    Physical Exam: Vitals:   05/28/18 0230 05/28/18 0300 05/28/18 0330 05/28/18 0412  BP: (!) 146/75 (!) 149/76 (!) 154/75   Pulse: (!) 55 (!) 54 (!) 58   Resp: '17 16 14   '$ Temp:      TempSrc:      SpO2: 96% 97% 99%   Weight:    98.4 kg  Height:    '5\' 8"'$  (1.727 m)   General: Not in acute distress HEENT:       Eyes: PERRL, EOMI, no scleral icterus.       ENT: No discharge from the ears and nose, no pharynx injection, no tonsillar enlargement.        Neck: No JVD, no bruit, no mass felt. Heme: No neck lymph node enlargement. Cardiac: S1/S2, RRR, No murmurs, No gallops or rubs. Respiratory: No rales, wheezing, rhonchi or rubs. GI: Soft, nondistended, nontender, no rebound pain, no organomegaly, BS present. GU: No hematuria Ext: No pitting leg edema bilaterally. 2+DP/PT pulse bilaterally. Musculoskeletal: No joint deformities, No joint redness or warmth, no limitation of ROM in spin. Skin: No rashes.  Neuro: Alert, oriented X3, cranial nerves II-XII grossly intact, moves all extremities normally. Psych: Patient is not psychotic, no suicidal or  hemocidal ideation.  Labs on Admission: I have personally reviewed following labs and imaging studies  CBC: Recent Labs  Lab 05/27/18 1814  WBC 12.7*  HGB 13.7  HCT 43.1  MCV 92.9  PLT 416   Basic Metabolic Panel: Recent Labs  Lab 05/27/18 1814  NA 137  K 4.2  CL 103  CO2 24  GLUCOSE 116*  BUN 19  CREATININE 1.16  CALCIUM 9.5   GFR: Estimated Creatinine Clearance: 68.3 mL/min (by C-G formula based on SCr of 1.16 mg/dL). Liver Function Tests: No results for input(s): AST, ALT, ALKPHOS, BILITOT, PROT, ALBUMIN in the last 168 hours. No results for input(s): LIPASE, AMYLASE in the last 168 hours. No results for input(s): AMMONIA in the last 168 hours. Coagulation Profile: Recent Labs  Lab 05/27/18 1814  INR 1.04   Cardiac Enzymes: Recent Labs  Lab 05/28/18 0318  TROPONINI 0.10*   BNP (last 3 results) No results for input(s): PROBNP in the last 8760 hours. HbA1C: No results for input(s): HGBA1C in the last 72 hours. CBG: No results for input(s): GLUCAP in the last 168 hours. Lipid Profile: Recent Labs    05/28/18 0318  CHOL 171  HDL 37*  LDLCALC 73  TRIG 307*  CHOLHDL 4.6   Thyroid Function Tests: No results for input(s): TSH, T4TOTAL, FREET4, T3FREE, THYROIDAB in the last 72 hours. Anemia Panel: No results for input(s): VITAMINB12, FOLATE, FERRITIN, TIBC, IRON, RETICCTPCT in the last 72 hours. Urine analysis:    Component Value Date/Time   COLORURINE YELLOW 07/01/2015 1210   APPEARANCEUR CLEAR 07/01/2015 1210   LABSPEC <=1.005 (A) 07/01/2015 1210   PHURINE 6.0 07/01/2015 1210   GLUCOSEU NEGATIVE 07/01/2015 1210   HGBUR SMALL (A) 07/01/2015 1210   BILIRUBINUR neg 05/14/2018 1059   KETONESUR NEGATIVE 07/01/2015 1210   PROTEINUR Negative 05/14/2018 1059   UROBILINOGEN 0.2 05/14/2018 1059   UROBILINOGEN 0.2 07/01/2015 1210   NITRITE neg 05/14/2018 1059   NITRITE NEGATIVE 07/01/2015 1210   LEUKOCYTESUR Negative 05/14/2018 1059   Sepsis  Labs: '@LABRCNTIP'$ (procalcitonin:4,lacticidven:4) ) Recent Results (from the past 240 hour(s))  MRSA PCR Screening     Status: None   Collection Time: 05/28/18  4:13 AM  Result Value Ref Range Status   MRSA by PCR NEGATIVE NEGATIVE Final    Comment:        The GeneXpert MRSA Assay (FDA approved for NASAL specimens only), is one component of a comprehensive MRSA colonization surveillance program. It is not intended to diagnose MRSA infection nor to guide or monitor treatment for MRSA infections. Performed at [facility], [place] [address], [place], [place] [address]      Radiological Exams on Admission: Dg Chest 2 View  Result Date: 05/27/2018 CLINICAL DATA:  Chest pain EXAM: CHEST - 2 VIEW COMPARISON:  05/11/2015 FINDINGS: The heart size and mediastinal contours are within normal limits. Both lungs are clear. The visualized skeletal structures are unremarkable. IMPRESSION: No active cardiopulmonary disease. Electronically Signed   By: Ulyses Jarred M.D.   On: 05/27/2018 19:44     EKG: Independently reviewed.  Sinus rhythm, QTC 427, early R wave progression, nonspecific T wave change.   Assessment/Plan Principal Problem:   Chest pain Active Problems:   OSA (obstructive sleep apnea)   GERD (gastroesophageal reflux disease)   Leukocytosis   Hyperlipidemia   PAF (paroxysmal atrial fibrillation) (HCC)   HLD (hyperlipidemia)   Elevated troponin   Chest pain and elevated trop: the repeated trop 0.10, indicating possible NSTEMI. pt is on Xarelto. Will switch to IV heparin in case pt needs procedure.  - will place on Tele bed for obs - cycle CE q6 x3 and repeat EKG in the am  - prn Nitroglycerin, Morphine, and aspirin, lipitor  - Risk factor stratification: will check FLP and A1C  - did not order 2d echo--> please reevaluate pt in AM to decide if pt  needs 2D echo. - please call Card in AM (Dr. Einar Gip) - switch xarelto to IV heparin  OSA (obstructive sleep  apnea): -CPAP  GERD: -Protonix  Leukocytosis: WBC 12.7. No fever.  No signs of infection, possibly due to stress-induced demargination. -Follow-up with CBC  Hyperlipidemia: -lipitor  PAF: CHA2DS2-VASc Score is 1, dose not need AC, but pt is on xarelto, not sure why pt is on Xarelto. Heart rate is well controlled. Not on nodal blockers. -switch xarelto to IV heparin as above  HLD (hyperlipidemia): -lipitor    DVT ppx: on IV heparin Code Status: Full code Family Communication: Yes, patient's wife  at bed side Disposition Plan:  Anticipate discharge back to previous home environment Consults called:  none Admission status: Obs / tele    Date of Service 05/28/2018    [facility]   If 7PM-7AM, please contact night-coverage [email or web address] Password [email or web address] 05/28/2018, 7:00 AM

## 2018-05-29 ENCOUNTER — Encounter (HOSPITAL_COMMUNITY)
Admission: EM | Disposition: A | Discharge status: Home / Self Care | Payer: Self-pay | Source: Home / Self Care | Attending: Internal Medicine

## 2018-05-29 ENCOUNTER — Encounter (HOSPITAL_COMMUNITY): Payer: Self-pay | Admitting: Cardiovascular Disease

## 2018-05-29 DIAGNOSIS — I319 Disease of pericardium, unspecified: Secondary | ICD-10-CM | POA: Diagnosis present

## 2018-05-29 DIAGNOSIS — Z8349 Family history of other endocrine, nutritional and metabolic diseases: Secondary | ICD-10-CM | POA: Diagnosis not present

## 2018-05-29 DIAGNOSIS — E785 Hyperlipidemia, unspecified: Secondary | ICD-10-CM | POA: Diagnosis present

## 2018-05-29 DIAGNOSIS — G4731 Primary central sleep apnea: Secondary | ICD-10-CM | POA: Diagnosis present

## 2018-05-29 DIAGNOSIS — I2510 Atherosclerotic heart disease of native coronary artery without angina pectoris: Secondary | ICD-10-CM | POA: Diagnosis not present

## 2018-05-29 DIAGNOSIS — K449 Diaphragmatic hernia without obstruction or gangrene: Secondary | ICD-10-CM | POA: Diagnosis present

## 2018-05-29 DIAGNOSIS — G4733 Obstructive sleep apnea (adult) (pediatric): Secondary | ICD-10-CM | POA: Diagnosis not present

## 2018-05-29 DIAGNOSIS — Z841 Family history of disorders of kidney and ureter: Secondary | ICD-10-CM | POA: Diagnosis not present

## 2018-05-29 DIAGNOSIS — M1990 Unspecified osteoarthritis, unspecified site: Secondary | ICD-10-CM | POA: Diagnosis present

## 2018-05-29 DIAGNOSIS — I10 Essential (primary) hypertension: Secondary | ICD-10-CM | POA: Diagnosis present

## 2018-05-29 DIAGNOSIS — Z8249 Family history of ischemic heart disease and other diseases of the circulatory system: Secondary | ICD-10-CM | POA: Diagnosis not present

## 2018-05-29 DIAGNOSIS — Z905 Acquired absence of kidney: Secondary | ICD-10-CM | POA: Diagnosis not present

## 2018-05-29 DIAGNOSIS — D72829 Elevated white blood cell count, unspecified: Secondary | ICD-10-CM | POA: Diagnosis not present

## 2018-05-29 DIAGNOSIS — I214 Non-ST elevation (NSTEMI) myocardial infarction: Secondary | ICD-10-CM | POA: Diagnosis present

## 2018-05-29 DIAGNOSIS — K219 Gastro-esophageal reflux disease without esophagitis: Secondary | ICD-10-CM | POA: Diagnosis present

## 2018-05-29 DIAGNOSIS — E7800 Pure hypercholesterolemia, unspecified: Secondary | ICD-10-CM

## 2018-05-29 DIAGNOSIS — Z833 Family history of diabetes mellitus: Secondary | ICD-10-CM | POA: Diagnosis not present

## 2018-05-29 DIAGNOSIS — I441 Atrioventricular block, second degree: Secondary | ICD-10-CM | POA: Diagnosis present

## 2018-05-29 DIAGNOSIS — Z87891 Personal history of nicotine dependence: Secondary | ICD-10-CM | POA: Diagnosis not present

## 2018-05-29 DIAGNOSIS — R079 Chest pain, unspecified: Secondary | ICD-10-CM | POA: Diagnosis present

## 2018-05-29 DIAGNOSIS — E782 Mixed hyperlipidemia: Secondary | ICD-10-CM

## 2018-05-29 DIAGNOSIS — Z7901 Long term (current) use of anticoagulants: Secondary | ICD-10-CM | POA: Diagnosis not present

## 2018-05-29 DIAGNOSIS — Z955 Presence of coronary angioplasty implant and graft: Secondary | ICD-10-CM | POA: Diagnosis not present

## 2018-05-29 DIAGNOSIS — Z7982 Long term (current) use of aspirin: Secondary | ICD-10-CM | POA: Diagnosis not present

## 2018-05-29 DIAGNOSIS — I480 Paroxysmal atrial fibrillation: Secondary | ICD-10-CM | POA: Diagnosis present

## 2018-05-29 DIAGNOSIS — Z823 Family history of stroke: Secondary | ICD-10-CM | POA: Diagnosis not present

## 2018-05-29 DIAGNOSIS — R7303 Prediabetes: Secondary | ICD-10-CM | POA: Diagnosis present

## 2018-05-29 DIAGNOSIS — I25110 Atherosclerotic heart disease of native coronary artery with unstable angina pectoris: Secondary | ICD-10-CM | POA: Diagnosis present

## 2018-05-29 HISTORY — PX: CORONARY STENT INTERVENTION: CATH118234

## 2018-05-29 HISTORY — PX: LEFT HEART CATH AND CORONARY ANGIOGRAPHY: CATH118249

## 2018-05-29 LAB — HEMOGLOBIN A1C
Hgb A1c MFr Bld: 5.8 % — ABNORMAL HIGH (ref 4.8–5.6)
Mean Plasma Glucose: 120 mg/dL

## 2018-05-29 LAB — APTT: APTT: 93 s — AB (ref 24–36)

## 2018-05-29 LAB — POCT ACTIVATED CLOTTING TIME
Activated Clotting Time: 301 seconds
Activated Clotting Time: 241 seconds

## 2018-05-29 LAB — HEPARIN LEVEL (UNFRACTIONATED): Heparin Unfractionated: 0.41 IU/mL (ref 0.30–0.70)

## 2018-05-29 SURGERY — LEFT HEART CATH AND CORONARY ANGIOGRAPHY
Anesthesia: LOCAL

## 2018-05-29 MED ORDER — LIDOCAINE HCL (PF) 1 % IJ SOLN
INTRAMUSCULAR | Status: DC | PRN
  Administered 2018-05-29: 2 mL

## 2018-05-29 MED ORDER — HEPARIN SODIUM (PORCINE) 1000 UNIT/ML IJ SOLN
INTRAMUSCULAR | Status: DC | PRN
  Administered 2018-05-29: 5000 [IU] via INTRAVENOUS
  Administered 2018-05-29: 2000 [IU] via INTRAVENOUS
  Administered 2018-05-29: 5000 [IU] via INTRAVENOUS

## 2018-05-29 MED ORDER — CLOPIDOGREL BISULFATE 300 MG PO TABS
ORAL_TABLET | ORAL | Status: DC | PRN
  Administered 2018-05-29: 600 mg via ORAL

## 2018-05-29 MED ORDER — IOHEXOL 350 MG/ML SOLN
INTRAVENOUS | Status: DC | PRN
  Administered 2018-05-29: 215 mL via INTRAVENOUS

## 2018-05-29 MED ORDER — VERAPAMIL HCL 2.5 MG/ML IV SOLN
INTRAVENOUS | Status: AC
  Filled 2018-05-29: qty 2

## 2018-05-29 MED ORDER — MIDAZOLAM HCL 2 MG/2ML IJ SOLN
INTRAMUSCULAR | Status: AC
  Filled 2018-05-29: qty 2

## 2018-05-29 MED ORDER — HEPARIN SODIUM (PORCINE) 1000 UNIT/ML IJ SOLN
INTRAMUSCULAR | Status: AC
  Filled 2018-05-29: qty 1

## 2018-05-29 MED ORDER — LIDOCAINE HCL (PF) 1 % IJ SOLN
INTRAMUSCULAR | Status: AC
  Filled 2018-05-29: qty 30

## 2018-05-29 MED ORDER — HEPARIN (PORCINE) IN NACL 1000-0.9 UT/500ML-% IV SOLN
INTRAVENOUS | Status: AC
  Filled 2018-05-29: qty 1000

## 2018-05-29 MED ORDER — SODIUM CHLORIDE 0.9 % IV SOLN: 250.0000 mL | INTRAVENOUS | Status: DC | PRN

## 2018-05-29 MED ORDER — SODIUM CHLORIDE 0.9 % WEIGHT BASED INFUSION: 1.0000 mL/kg/h | INTRAVENOUS | Status: AC

## 2018-05-29 MED ORDER — MIDAZOLAM HCL 2 MG/2ML IJ SOLN
INTRAMUSCULAR | Status: DC | PRN
  Administered 2018-05-29 (×2): 1 mg via INTRAVENOUS

## 2018-05-29 MED ORDER — FENTANYL CITRATE (PF) 100 MCG/2ML IJ SOLN
INTRAMUSCULAR | Status: AC
  Filled 2018-05-29: qty 2

## 2018-05-29 MED ORDER — CLOPIDOGREL BISULFATE 75 MG PO TABS
75.0000 mg | ORAL_TABLET | Freq: Every day | ORAL | Status: DC
  Administered 2018-05-30: 75 mg via ORAL
  Filled 2018-05-29: qty 1

## 2018-05-29 MED ORDER — SODIUM CHLORIDE 0.9% FLUSH: 3.0000 mL | INTRAVENOUS | Status: DC | PRN

## 2018-05-29 MED ORDER — RIVAROXABAN 20 MG PO TABS: 20.0000 mg | ORAL_TABLET | Freq: Every day | ORAL | Status: DC

## 2018-05-29 MED ORDER — NITROGLYCERIN 1 MG/10 ML FOR IR/CATH LAB
INTRA_ARTERIAL | Status: DC | PRN
  Administered 2018-05-29: 100 ug via INTRACORONARY

## 2018-05-29 MED ORDER — NITROGLYCERIN 1 MG/10 ML FOR IR/CATH LAB
INTRA_ARTERIAL | Status: AC
  Filled 2018-05-29: qty 10

## 2018-05-29 MED ORDER — SODIUM CHLORIDE 0.9% FLUSH
3.0000 mL | Freq: Two times a day (BID) | INTRAVENOUS | Status: DC
  Administered 2018-05-29 – 2018-05-30 (×2): 3 mL via INTRAVENOUS

## 2018-05-29 MED ORDER — HEPARIN (PORCINE) IN NACL 1000-0.9 UT/500ML-% IV SOLN
INTRAVENOUS | Status: DC | PRN
  Administered 2018-05-29 (×2): 500 mL

## 2018-05-29 MED ORDER — CLOPIDOGREL BISULFATE 300 MG PO TABS
ORAL_TABLET | ORAL | Status: AC
  Filled 2018-05-29: qty 2

## 2018-05-29 MED ORDER — CARVEDILOL 3.125 MG PO TABS
3.1250 mg | ORAL_TABLET | Freq: Two times a day (BID) | ORAL | Status: DC
  Administered 2018-05-29 – 2018-05-30 (×2): 3.125 mg via ORAL
  Filled 2018-05-29 (×2): qty 1

## 2018-05-29 MED ORDER — LABETALOL HCL 5 MG/ML IV SOLN: 10.0000 mg | INTRAVENOUS | Status: AC | PRN

## 2018-05-29 MED ORDER — FENTANYL CITRATE (PF) 100 MCG/2ML IJ SOLN
INTRAMUSCULAR | Status: DC | PRN
  Administered 2018-05-29 (×2): 50 ug via INTRAVENOUS

## 2018-05-29 MED ORDER — FENOFIBRATE 54 MG PO TABS
54.0000 mg | ORAL_TABLET | Freq: Every day | ORAL | Status: DC
  Administered 2018-05-29 – 2018-05-30 (×2): 54 mg via ORAL
  Filled 2018-05-29 (×2): qty 1

## 2018-05-29 MED ORDER — VERAPAMIL HCL 2.5 MG/ML IV SOLN
INTRAVENOUS | Status: DC | PRN
  Administered 2018-05-29: 10 mL via INTRA_ARTERIAL

## 2018-05-29 SURGICAL SUPPLY — 22 items
BALLN SAPPHIRE 2.0X12 (BALLOONS) ×2
BALLN SAPPHIRE ~~LOC~~ 3.0X15 (BALLOONS) ×2 IMPLANT
BALLN ~~LOC~~ EUPHORA RX 2.75X15 (BALLOONS) ×2
BALLOON SAPPHIRE 2.0X12 (BALLOONS) ×1 IMPLANT
BALLOON ~~LOC~~ EUPHORA RX 2.75X15 (BALLOONS) ×1 IMPLANT
CATH INFINITI 5 FR JL3.5 (CATHETERS) ×2 IMPLANT
CATH INFINITI 5FR JK (CATHETERS) ×2 IMPLANT
CATH INFINITI JR4 5F (CATHETERS) ×2 IMPLANT
CATH LAUNCHER 6FR EBU3.5 (CATHETERS) ×2 IMPLANT
CATH VISTA GUIDE 6FR JR4 (CATHETERS) ×2 IMPLANT
DEVICE RAD COMP TR BAND LRG (VASCULAR PRODUCTS) ×2 IMPLANT
GLIDESHEATH SLEND SS 6F .021 (SHEATH) ×2 IMPLANT
GUIDEWIRE INQWIRE 1.5J.035X260 (WIRE) ×1 IMPLANT
INQWIRE 1.5J .035X260CM (WIRE) ×2
KIT ENCORE 26 ADVANTAGE (KITS) ×2 IMPLANT
KIT HEART LEFT (KITS) ×2 IMPLANT
PACK CARDIAC CATHETERIZATION (CUSTOM PROCEDURE TRAY) ×2 IMPLANT
STENT SYNERGY DES 2.5X20 (Permanent Stent) ×2 IMPLANT
STENT SYNERGY DES 2.75X20 (Permanent Stent) ×2 IMPLANT
TRANSDUCER W/STOPCOCK (MISCELLANEOUS) ×2 IMPLANT
TUBING CIL FLEX 10 FLL-RA (TUBING) ×2 IMPLANT
WIRE RUNTHROUGH .014X180CM (WIRE) ×2 IMPLANT

## 2018-05-29 NOTE — Plan of Care (Signed)

## 2018-05-29 NOTE — Progress Notes (Addendum)
Progress Note    [name]  TIW:[id number] DOB: 04/11/1950  DOA: 05/27/2018 PCP: Laurey Morale, MD    Brief Narrative:   Chief complaint: Chest pain  Medical records reviewed and are as summarized below:  Gabriel Aguilar is an 68 y.o. male HTN, PAF on Xarelto, OSA on CPAP, BPH, H/O pericarditis; who presented with weeks of dyspnea with exertion.  Assessment/Plan:   Principal Problem:   Chest pain Active Problems:   OSA (obstructive sleep apnea)   GERD (gastroesophageal reflux disease)   Leukocytosis   Hyperlipidemia   PAF (paroxysmal atrial fibrillation) (HCC)   HLD (hyperlipidemia)   Elevated troponin   NSTEMI (non-ST elevated myocardial infarction) ([place])  1.  NSTEMI, coronary artery disease: Patient presented with complaints of dyspnea with exertion relieved with rest.  troponins during hospitalization peaked at 0.1 ->0.08->0.05, but appear to be trending down.  Due to symptoms and history he was started on a heparin drip per pharmacy and Xarelto she had taken previously was held.  Cardiology consulted.  Echocardiogram revealed EF of 60 to 65% with no regional wall motion abnormalities.  Reviewed from previous CT angiogram 2017 noted calcifications of the LAD and RCA for which cardiology recommended left heart catheterization.  Left heart catheterization performed on 2/19 noted: 1.  Severe two-vessel coronary artery disease involving RCA and left circumflex.  Moderate LAD disease. 2.  Normal LV systolic function mildly elevated left ventricular end-diastolic pressure. 3.  Successful angioplasty and drug-eluting stent placement to the mid right coronary artery and mid left circumflex. -Will follow recommendations as seen below: Xarelto can be resumed tomorrow if no bleeding complications.  Once Xarelto is resumed, aspirin can be stopped and Plavix should be continued for at least a year.  2.  Essential hypertension: Systolic blood pressures noted to be mildly elevated this  morning into the 150s.  Patient was started on [place] -Would add on ARB, if function stable after cardiac cath  3.  Hyperlipidemia: Lipid panel revealed total cholesterol 171, HDL 37, LDL 73, and triglycerides 307.    LDL near goal less than 70. -Continue atorvastatin -Add on fenofibrate   4.  PAF on chronic anticoagulation: CHA2DS2-VASc score = at least 2. -Xarelto to be restarted in a.m.  5.  Leukocytosis:WBC elevated at 12.6 on 2/18.  Patient denies any fever or infectious symptoms.  Suspect likely reactive in nature to acute cardiac event. -Follow-up repeat CBC in in a.m.  6.  OSA on CPAP: Stable. -Continue CPAP at home  7.  Prediabetes: Hemoglobin A1c 5.8  8.  GERD: Stable. -Continue pharmacy substitution of Protonix  Body mass index is 32.98 kg/m.   Family Communication/Anticipated D/C date and plan/Code Status   DVT prophylaxis: Xarelto to restart tomorrow Code Status: Full Code.  Family Communication: Discussed plan of care with patient family present bedside Disposition Plan: Likely discharge home in 1 to 2 days   Medical Consultants:   Cardiology  Anti-Infectives:    None  Subjective:   Patient reports having decreased sleep due to breathing overnight.  Objective:    Vitals:   05/29/18 1013 05/29/18 1037 05/29/18 1100 05/29/18 1115  BP:  130/69 124/68 128/75  Pulse: (!) 0 61 (!) 59 61  Resp: (!) 0  12 17  Temp:      TempSrc:      SpO2: (!) 0% 96% 97% 97%  Weight:      Height:        Intake/Output Summary (Last  24 hours) at 05/29/2018 1250 Last data filed at 05/29/2018 0400 Gross per 24 hour  Intake 777.9 ml  Output -  Net 777.9 ml   Filed Weights   05/28/18 0412  Weight: 98.4 kg    Exam: Constitutional: NAD, calm, comfortable Eyes: PERRL, lids and conjunctivae normal ENMT: Mucous membranes are moist. Posterior pharynx clear of any exudate or lesions.Normal dentition.  Neck: normal, supple, no masses, no  thyromegaly Respiratory: clear to auscultation bilaterally, no wheezing, no crackles. Normal respiratory effort. No accessory muscle use.  Cardiovascular: Regular rate and rhythm, no murmurs / rubs / gallops. No extremity edema. 2+ pedal pulses. No carotid bruits.  Abdomen: no tenderness, no masses palpated. No hepatosplenomegaly. Bowel sounds positive.  Musculoskeletal: no clubbing / cyanosis. No joint deformity upper and lower extremities. Good ROM, no contractures. Normal muscle tone.  Skin: no rashes, lesions, ulcers. No induration Neurologic: CN 2-12 grossly intact. Sensation intact, DTR normal. Strength 5/5 in all 4.  Psychiatric: Normal judgment and insight. Alert and oriented x 3. Normal mood.    Data Reviewed:   I have personally reviewed following labs and imaging studies:  Labs: Labs show the following:   Basic Metabolic Panel: Recent Labs  Lab 05/27/18 1814 05/28/18 0647  NA 137 139  K 4.2 4.2  CL 103 104  CO2 24 26  GLUCOSE 116* 97  BUN 19 16  CREATININE 1.16 1.12  CALCIUM 9.5 9.2   GFR Estimated Creatinine Clearance: 70.8 mL/min (by C-G formula based on SCr of 1.12 mg/dL). Liver Function Tests: No results for input(s): AST, ALT, ALKPHOS, BILITOT, PROT, ALBUMIN in the last 168 hours. No results for input(s): LIPASE, AMYLASE in the last 168 hours. No results for input(s): AMMONIA in the last 168 hours. Coagulation profile Recent Labs  Lab 05/27/18 1814  INR 1.04    CBC: Recent Labs  Lab 05/27/18 1814 05/28/18 0647  WBC 12.7* 12.6*  HGB 13.7 13.7  HCT 43.1 41.7  MCV 92.9 91.4  PLT 343 299   Cardiac Enzymes: Recent Labs  Lab 05/28/18 0318 05/28/18 0647 05/28/18 1352  TROPONINI 0.10* 0.08* 0.05*   BNP (last 3 results) No results for input(s): PROBNP in the last 8760 hours. CBG: No results for input(s): GLUCAP in the last 168 hours. D-Dimer: No results for input(s): DDIMER in the last 72 hours. Hgb A1c: Recent Labs    05/28/18 0318   HGBA1C 5.8*   Lipid Profile: Recent Labs    05/28/18 0318  CHOL 171  HDL 37*  LDLCALC 73  TRIG 307*  CHOLHDL 4.6   Thyroid function studies: No results for input(s): TSH, T4TOTAL, T3FREE, THYROIDAB in the last 72 hours.  Invalid input(s): FREET3 Anemia work up: No results for input(s): VITAMINB12, FOLATE, FERRITIN, TIBC, IRON, RETICCTPCT in the last 72 hours. Sepsis Labs: Recent Labs  Lab 05/27/18 1814 05/28/18 0647  WBC 12.7* 12.6*    Microbiology Recent Results (from the past 240 hour(s))  MRSA PCR Screening     Status: None   Collection Time: 05/28/18  4:13 AM  Result Value Ref Range Status   MRSA by PCR NEGATIVE NEGATIVE Final    Comment:        The GeneXpert MRSA Assay (FDA approved for NASAL specimens only), is one component of a comprehensive MRSA colonization surveillance program. It is not intended to diagnose MRSA infection nor to guide or monitor treatment for MRSA infections. Performed at [facility], [place] [address], [place], [place] [address]  Procedures and diagnostic studies:  Dg Chest 2 View  Result Date: 05/27/2018 CLINICAL DATA:  Chest pain EXAM: CHEST - 2 VIEW COMPARISON:  05/11/2015 FINDINGS: The heart size and mediastinal contours are within normal limits. Both lungs are clear. The visualized skeletal structures are unremarkable. IMPRESSION: No active cardiopulmonary disease. Electronically Signed   By: Ulyses Jarred M.D.   On: 05/27/2018 19:44    Medications:   . aspirin EC  81 mg Oral Daily  . atorvastatin  80 mg Oral q1800  . carvedilol  3.125 mg Oral BID WC  . [START ON 05/30/2018] clopidogrel  75 mg Oral Q breakfast  . multivitamin with minerals   Oral Daily  . pantoprazole  40 mg Oral Daily  . sodium chloride flush  3 mL Intravenous Q12H   Continuous Infusions: . sodium chloride    . sodium chloride 1 mL/kg/hr (05/29/18 1027)     LOS: 0 days    A   [facility]   *Please refer to  [email or web address], password [email or web address] to get updated schedule on who will round on this patient, as hospitalists switch teams weekly. If 7PM-7AM, please contact night-coverage at [email or web address], password [email or web address] for any overnight needs.

## 2018-05-29 NOTE — Interval H&P Note (Signed)
Cath Lab Visit (complete for each Cath Lab visit)  Clinical Evaluation Leading to the Procedure:   ACS: Yes.    Non-ACS:  n/a    History and Physical Interval Note:  05/29/2018 8:40 AM  Gabriel Aguilar  has presented today for surgery, with the diagnosis of ua  The various methods of treatment have been discussed with the patient and family. After consideration of risks, benefits and other options for treatment, the patient has consented to  Procedure(s): LEFT HEART CATH AND CORONARY ANGIOGRAPHY (N/A) as a surgical intervention .  The patient's history has been reviewed, patient examined, no change in status, stable for surgery.  I have reviewed the patient's chart and labs.  Questions were answered to the patient's satisfaction.     Kathlyn [place]

## 2018-05-29 NOTE — Progress Notes (Signed)
IV Heparin  Discontinued as patient is transported to cath lab per monitor/RN

## 2018-05-29 NOTE — Progress Notes (Signed)
ANTICOAGULATION CONSULT NOTE - Follow-up Consult  Pharmacy Consult for Heparin Indication: NSTEMI  No Known Allergies  Patient Measurements: Height: 5\' 8"  (172.7 cm) Weight: 216 lb 14.9 oz (98.4 kg) IBW/kg (Calculated) : 68.4 Heparin Dosing Weight: 89 kg  Vital Signs: Temp: 98.1 F (36.7 C) (02/19 0024) Temp Source: Oral (02/19 0024) BP: 132/68 (02/19 0024) Pulse Rate: 77 (02/19 0024)  Labs: Recent Labs    05/27/18 1814 05/28/18 0318 05/28/18 0647 05/28/18 0807 05/28/18 1352 05/29/18 0053  HGB 13.7  --  13.7  --   --   --   HCT 43.1  --  41.7  --   --   --   PLT 343  --  299  --   --   --   APTT  --   --   --  47*  --  93*  LABPROT 13.5  --   --   --   --   --   INR 1.04  --   --   --   --   --   HEPARINUNFRC  --   --   --  1.00*  --  0.41  CREATININE 1.16  --  1.12  --   --   --   TROPONINI  --  0.10* 0.08*  --  0.05*  --     Estimated Creatinine Clearance: 70.8 mL/min (by C-G formula based on SCr of 1.12 mg/dL).   Assessment: 68 y.o. M presents with CP, SOB. Pt on Xarelto PTA for PAF. Last dose 2/17 1700. To begin heparin for NSTEMI. Trop up to 0.1. CBC ok on admission.  Xarelto will likely be affecting heparin level so will utilize PTT for heparin dosing until levels correlating. Baseline heparin level 1 (so Xarelto affecting).  PTT 93 sec and heparin level 0.41 - appears that levels are correlating - both therapeutic. No bleeding noted. Noted plan for cath today  Goal of Therapy:  Heparin level 0.3-0.7 units/ml Monitor platelets by anticoagulation protocol: Yes   Plan:  Continue heparin gtt at 1400 units/hr Will utilize heparin levels for monitoring heparin level since levels correlating - d/c PTT Will f/u post cath    Sherlon Handing, PharmD, BCPS Clinical pharmacist  **Pharmacist phone directory can now be found on [email or web address] (PW [email or web address]).  Listed under [place]. 05/29/2018,1:37 AM

## 2018-05-29 NOTE — Progress Notes (Addendum)
The patient has been seen in conjunction with Ina Homes, MD. All aspects of care have been considered and discussed. The patient has been personally interviewed, examined, and all clinical data has been reviewed.   As anticipated, patient had tight two-vessel coronary disease that was successfully treated with drug-eluting stents.  Plan to resume Xarelto in a.m., discontinue aspirin, and continue dual antithrombotic therapy with Xarelto and Plavix for 1 year following stent implantation.  Anticipate discharge in a.m.  Digital images personally reviewed.   Progress Note  Patient Name: Gabriel Aguilar Date of Encounter: 05/29/2018  Primary Cardiologist: [name], MD  Subjective   Patient feeling well this AM. No further CP or SOB. Understands plan for cath today. No questions or concerns.   Inpatient Medications    Scheduled Meds: . aspirin EC  81 mg Oral Daily  . atorvastatin  80 mg Oral q1800  . carvedilol  3.125 mg Oral BID WC  . multivitamin with minerals   Oral Daily  . pantoprazole  40 mg Oral Daily  . sodium chloride flush  3 mL Intravenous Q12H   Continuous Infusions: . sodium chloride    . sodium chloride 1 mL/kg/hr (05/28/18 2134)  . heparin 1,400 Units/hr (05/29/18 0400)   PRN Meds: sodium chloride, acetaminophen **OR** acetaminophen, morphine injection, nitroGLYCERIN, ondansetron **OR** ondansetron (ZOFRAN) IV, senna-docusate, sodium chloride flush   Vital Signs    Vitals:   05/28/18 1747 05/28/18 1926 05/29/18 0024 05/29/18 0426  BP: 137/75 134/66 132/68 133/75  Pulse: 76 74 77 73  Resp:  19 16 10   Temp: 98.1 F (36.7 C) 98.1 F (36.7 C) 98.1 F (36.7 C) 98.1 F (36.7 C)  TempSrc: Oral Oral Oral Oral  SpO2:  95% 97% 95%  Weight:      Height:        Intake/Output Summary (Last 24 hours) at 05/29/2018 0736 Last data filed at 05/29/2018 0400 Gross per 24 hour  Intake 777.9 ml  Output -  Net 777.9 ml   Filed Weights   05/28/18 0412    Weight: 98.4 kg   Telemetry    NSR - Personally Reviewed  ECG    No new ECG  Physical Exam   Today's Vitals   05/28/18 1926 05/28/18 2040 05/29/18 0024 05/29/18 0426  BP: 134/66  132/68 133/75  Pulse: 74  77 73  Resp: 19  16 10   Temp: 98.1 F (36.7 C)  98.1 F (36.7 C) 98.1 F (36.7 C)  TempSrc: Oral  Oral Oral  SpO2: 95%  97% 95%  Weight:      Height:      PainSc:  0-No pain     Body mass index is 32.98 kg/m.  GEN: No acute distress.   Neck: No JVD Cardiac: RRR, no murmurs, rubs, or gallops.  Respiratory: Clear to auscultation bilaterally. GI: Soft, nontender, non-distended  MS: No edema; No deformity. Neuro:  Nonfocal  Psych: Normal affect   Labs    Chemistry Recent Labs  Lab 05/27/18 1814 05/28/18 0647  NA 137 139  K 4.2 4.2  CL 103 104  CO2 24 26  GLUCOSE 116* 97  BUN 19 16  CREATININE 1.16 1.12  CALCIUM 9.5 9.2  GFRNONAA >60 >60  GFRAA >60 >60  ANIONGAP 10 9    Hematology Recent Labs  Lab 05/27/18 1814 05/28/18 0647  WBC 12.7* 12.6*  RBC 4.64 4.56  HGB 13.7 13.7  HCT 43.1 41.7  MCV 92.9 91.4  MCH 29.5 30.0  MCHC 31.8 32.9  RDW 13.1 13.1  PLT 343 299   Cardiac Enzymes Recent Labs  Lab 05/28/18 0318 05/28/18 0647 05/28/18 1352  TROPONINI 0.10* 0.08* 0.05*    Recent Labs  Lab 05/27/18 1820  TROPIPOC 0.02    BNPNo results for input(s): BNP, PROBNP in the last 168 hours.   DDimer No results for input(s): DDIMER in the last 168 hours.   Radiology    Dg Chest 2 View  Result Date: 05/27/2018 CLINICAL DATA:  Chest pain EXAM: CHEST - 2 VIEW COMPARISON:  05/11/2015 FINDINGS: The heart size and mediastinal contours are within normal limits. Both lungs are clear. The visualized skeletal structures are unremarkable. IMPRESSION: No active cardiopulmonary disease. Electronically Signed   By: Ulyses Jarred M.D.   On: 05/27/2018 19:44   Cardiac Studies   Transthoracic Echo 05/28/2018  1. The left ventricle has normal systolic  function with an ejection fraction of 60-65%. The cavity size was normal. Left ventricular diastolic parameters were normal No evidence of left ventricular regional wall motion abnormalities.  2. No evidence of left ventricular regional wall motion abnormalities.  3. The right ventricle has normal systolic function. The cavity was normal. There is no increase in right ventricular wall thickness.  4. The mitral valve is normal in structure.  5. The tricuspid valve is normal in structure.  6. The aortic valve was not well visualized.  Patient Profile     Gabriel Aguilar is a 68 y.o. male with a hx of HLD, GERD, PAF, OSA, and pericarditis who is being seen for the evaluation of chest pressure.  Assessment & Plan    NSTE-ACS - No CP or SOB since admission  - Telemetry stable  - Continue IV heparin  - Continue Aspirin 81 mg, Carvedilol 3.125 mg BID, and Atorvastatin 80 mg - Will need an ACE/ARB prior to DC - Plan for cath today. Further recs afterwards.   Hypertension  - BP goal <130/80  - Continue Carvedilol 3.125 mg BID - Start ACE/ARB prior to DC  Paroxysmal Atrial Fibrillation  - Tele with NSR  - Continue Carvedilol 3.125 mg BID - Anticoagulated with Xarelto. CHADsVASC of 2  - Holding Xarelto for cath  Will discuss the case further with Dr. Tamala Julian   For questions or updates, please contact [place] Please consult [email or web address] for contact info under Cardiology/STEMI.   Signed, Ina Homes, MD  05/29/2018, 7:36 AM

## 2018-05-30 ENCOUNTER — Ambulatory Visit: Payer: PPO | Admitting: Cardiology

## 2018-05-30 DIAGNOSIS — Z955 Presence of coronary angioplasty implant and graft: Secondary | ICD-10-CM

## 2018-05-30 DIAGNOSIS — I441 Atrioventricular block, second degree: Secondary | ICD-10-CM

## 2018-05-30 LAB — CBC
HEMATOCRIT: 39.7 % (ref 39.0–52.0)
HEMOGLOBIN: 13.2 g/dL (ref 13.0–17.0)
MCH: 30.2 pg (ref 26.0–34.0)
MCHC: 33.2 g/dL (ref 30.0–36.0)
MCV: 90.8 fL (ref 80.0–100.0)
Platelets: 296 10*3/uL (ref 150–400)
RBC: 4.37 MIL/uL (ref 4.22–5.81)
RDW: 13.1 % (ref 11.5–15.5)
WBC: 13.7 10*3/uL — AB (ref 4.0–10.5)
nRBC: 0 % (ref 0.0–0.2)

## 2018-05-30 LAB — BASIC METABOLIC PANEL
Anion gap: 8 (ref 5–15)
BUN: 14 mg/dL (ref 8–23)
CO2: 25 mmol/L (ref 22–32)
Calcium: 9.3 mg/dL (ref 8.9–10.3)
Chloride: 104 mmol/L (ref 98–111)
Creatinine, Ser: 1.19 mg/dL (ref 0.61–1.24)
GFR calc Af Amer: >60 mL/min (ref >60)
GFR calc non Af Amer: >60 mL/min (ref >60)
Glucose, Bld: 100 mg/dL — ABNORMAL HIGH (ref 70–99)
Potassium: 4.3 mmol/L (ref 3.5–5.1)
Sodium: 137 mmol/L (ref 135–145)

## 2018-05-30 MED ORDER — AMLODIPINE BESYLATE 5 MG PO TABS
5.0000 mg | ORAL_TABLET | Freq: Every day | ORAL | Status: DC
  Administered 2018-05-30: 5 mg via ORAL
  Filled 2018-05-30: qty 1

## 2018-05-30 MED ORDER — CLOPIDOGREL BISULFATE 75 MG PO TABS: 75.0000 mg | ORAL_TABLET | Freq: Every day | ORAL | 0 refills | Status: DC

## 2018-05-30 MED ORDER — NITROGLYCERIN 0.4 MG SL SUBL: 0.4000 mg | SUBLINGUAL_TABLET | SUBLINGUAL | 0 refills | Status: DC | PRN

## 2018-05-30 MED ORDER — AMLODIPINE BESYLATE 5 MG PO TABS: 5.0000 mg | ORAL_TABLET | Freq: Every day | ORAL | 0 refills | Status: DC

## 2018-05-30 MED ORDER — FENOFIBRATE 54 MG PO TABS: 54.0000 mg | ORAL_TABLET | Freq: Every day | ORAL | 0 refills | Status: DC

## 2018-05-30 NOTE — Progress Notes (Signed)
CARDIAC REHAB PHASE I   PRE:  Rate/Rhythm: SB 58  BP:  Supine:   Sitting: 143/69  Standing:    SaO2: 99 RA  MODE:  Ambulation: 680 ft   POST:  Rate/Rhythm: SR 63  BP:  Supine:   Sitting: 152/73  Standing:    SaO2: 97 RA  0815-0910  Pt assisted to ambulate 680 ft independently with no assistive device. Pt report no symptoms. Educated pt on stent card, heart healthy diet, medication use including NTG, exercise guidelines and movement restrictions. Stressed importance of continuation of xarelto and antiplatelet medication, plavix. Referral to [facility] discussed and order place for [place]. Pt verbalized understanding and all questions answered.  Towanda Malkin RN, BSN 05/30/2018 9:03 AM

## 2018-05-30 NOTE — Discharge Summary (Signed)
Discharge Summary  Gabriel Aguilar JEH:[id number] DOB: 1950/07/04  PCP: Laurey Morale, MD  Admit date: 05/27/2018 Discharge date: 05/30/2018  Time spent: 35mins, more than 50% time spent on coordination of care. Case discussed with cardiology Dr Tamala Julian.  Recommendations for Outpatient Follow-up:  1. F/u with PCP within a week  for hospital discharge follow up, repeat cbc/bmp at follow up 2. F/u with cardiology  Discharge Diagnoses:  Active Hospital Problems   Diagnosis Date Noted  . Chest pain 05/11/2015  . Status post coronary artery stent placement   . NSTEMI (non-ST elevated myocardial infarction) ([facility])   . HLD (hyperlipidemia) 05/28/2018  . Elevated troponin 05/28/2018  . PAF (paroxysmal atrial fibrillation) ([employer])   . Hyperlipidemia 05/11/2015  . GERD (gastroesophageal reflux disease) 05/11/2015  . Leukocytosis 05/11/2015  . OSA (obstructive sleep apnea) 03/05/2013    Resolved Hospital Problems  No resolved problems to display.    Discharge Condition: stable  Diet recommendation: heart healthy  Filed Weights   05/28/18 0412  Weight: 98.4 kg    History of present illness: (per admitting MD Dr Blaine Hamper) PCP: Laurey Morale, MD   Patient coming from:  The patient is coming from home.  At baseline, pt is independent for most of ADL.        Chief Complaint: chest pain and shortness of breath  HPI: Gabriel Aguilar is a 68 y.o. male with medical history significant of hyperlipidemia, GERD, PAF on Xarelto, OSA on CPAP, BPH, pericarditis, hypogonadism, who presents with chest pain and shortness of breath.  Patient states that he has been having intermittent exertional shortness of breath for more than 10 days.  Today he had 2 episodes of exertional chest pain, which was located in the left central chest, 5 out of 10 in severity, short lasting, tightness and cramping-like pain, currently chest pain-free, radiating to left arm.  Denies tenderness in calf areas.  Patient does not  have cough, fever or chills.  No nausea vomiting, diarrhea, abdominal pain, symptoms of UTI or unilateral weakness. Of note, pt had NM stress test 11/13/2016, which was negative.   ED Course: pt was found to have negative troponin, WBC 12.7, INR 1.04, electrolytes renal function okay, temperature normal, no tachycardia, oxygen saturation 98% on room air, negative chest x-ray.  Patient is placed on telemetry bed for observation.  Hospital Course:  Principal Problem:   Chest pain Active Problems:   OSA (obstructive sleep apnea)   GERD (gastroesophageal reflux disease)   Leukocytosis   Hyperlipidemia   PAF (paroxysmal atrial fibrillation) ([facility])   HLD (hyperlipidemia)   Elevated troponin   NSTEMI (non-ST elevated myocardial infarction) ([facility])   Status post coronary artery stent placement  NSTEMI Echocardiogram revealed EF of 60 to 65% with no regional wall motion abnormalities Cardiac cath on 2/19, s/p DES to RCA and Cx -he is discharged on plavix, continue statin  second-degree AV block  New diagnosis during this hospitalization Case discussed with cardiology, home meds coreg discontinued Patient is to follow up with cardiology closely  PAF Coreg discontinued as above Continue xarelto Follow up with cardiology  HTN Coreg discontinued as above Start norvasc  Leukocytosis: WBC elevated at 12.6 on 2/18.  Patient denies any fever or infectious symptoms.   Suspect likely reactive in nature to acute cardiac event F/u with pcp, repeat labs at follow up  OSA on cpap  Procedures:  Cardiac cath with stent placement on 2/19  Consultations:  cardiology  Discharge Exam: BP (!) 152/73  Pulse 64   Temp 98.2 F (36.8 C) (Oral)   Resp 20   Ht 5\' 8"  (1.727 m)   Wt 98.4 kg   SpO2 99%   BMI 32.98 kg/m   General: NAD Cardiovascular: RRR Respiratory: CTABL  Discharge Instructions You were cared for by a hospitalist during your hospital stay. If you have any questions about  your discharge medications or the care you received while you were in the hospital after you are discharged, you can call the unit and asked to speak with the hospitalist on call if the hospitalist that took care of you is not available. Once you are discharged, your primary care physician will handle any further medical issues. Please note that NO REFILLS for any discharge medications will be authorized once you are discharged, as it is imperative that you return to your primary care physician (or establish a relationship with a primary care physician if you do not have one) for your aftercare needs so that they can reassess your need for medications and monitor your lab values.  Discharge Instructions    Amb Referral to Cardiac Rehabilitation   Complete by:  As directed    Diagnosis:   Coronary Stents NSTEMI     Diet - low sodium heart healthy   Complete by:  As directed    Increase activity slowly   Complete by:  As directed      Allergies as of 05/30/2018   No Known Allergies     Medication List    TAKE these medications   amLODipine 5 MG tablet Commonly known as:  NORVASC Take 1 tablet (5 mg total) by mouth daily.   atorvastatin 80 MG tablet Commonly known as:  LIPITOR TAKE 1 TABLET BY MOUTH DAILY AT 6PM What changed:  See the new instructions.   clopidogrel 75 MG tablet Commonly known as:  PLAVIX Take 1 tablet (75 mg total) by mouth daily with breakfast. Start taking on:  May 31, 2018   fenofibrate 54 MG tablet Take 1 tablet (54 mg total) by mouth daily. Start taking on:  May 31, 2018   FOCUSED MIND PO Take 1 tablet by mouth daily.   MULTI VITAMIN PO Take 1 tablet by mouth daily.   nitroGLYCERIN 0.4 MG SL tablet Commonly known as:  NITROSTAT Place 1 tablet (0.4 mg total) under the tongue every 5 (five) minutes as needed for chest pain.   Omega 3 1000 MG Caps Take 1 capsule by mouth daily.   omeprazole 40 MG capsule Commonly known as:  PRILOSEC TAKE  1 CAPSULE BY MOUTH DAILY   oxymetazoline 0.05 % nasal spray Commonly known as:  AFRIN Place 1 spray into both nostrils at bedtime.   rivaroxaban 20 MG Tabs tablet Commonly known as:  XARELTO Take 20 mg by mouth daily with supper.   vitamin B-12 100 MCG tablet Commonly known as:  CYANOCOBALAMIN Take 100 mcg by mouth daily as needed.   vitamin C 1000 MG tablet Take 1,000 mg by mouth daily.   vitamin E 400 UNIT capsule Generic drug:  vitamin E Take 1,200 Units by mouth daily.      No Known Allergies Follow-up Information    Laurey Morale, MD Follow up in 1 week(s).   Specialty:  Family Medicine Why:  hospital discharge follow up. Contact information: [place] [address] [phone]            The results of significant diagnostics from this hospitalization (including  imaging, microbiology, ancillary and laboratory) are listed below for reference.    Significant Diagnostic Studies: Dg Chest 2 View  Result Date: 05/27/2018 CLINICAL DATA:  Chest pain EXAM: CHEST - 2 VIEW COMPARISON:  05/11/2015 FINDINGS: The heart size and mediastinal contours are within normal limits. Both lungs are clear. The visualized skeletal structures are unremarkable. IMPRESSION: No active cardiopulmonary disease. Electronically Signed   By: Ulyses Jarred M.D.   On: 05/27/2018 19:44    Microbiology: Recent Results (from the past 240 hour(s))  MRSA PCR Screening     Status: None   Collection Time: 05/28/18  4:13 AM  Result Value Ref Range Status   MRSA by PCR NEGATIVE NEGATIVE Final    Comment:        The GeneXpert MRSA Assay (FDA approved for NASAL specimens only), is one component of a comprehensive MRSA colonization surveillance program. It is not intended to diagnose MRSA infection nor to guide or monitor treatment for MRSA infections. Performed at [facility], [place] [address] [place], [place] [address]      Labs: Basic Metabolic Panel: Recent  Labs  Lab 05/27/18 1814 05/28/18 0647 05/30/18 0147  NA 137 139 137  K 4.2 4.2 4.3  CL 103 104 104  CO2 24 26 25   GLUCOSE 116* 97 100*  BUN 19 16 14   CREATININE 1.16 1.12 1.19  CALCIUM 9.5 9.2 9.3   Liver Function Tests: No results for input(s): AST, ALT, ALKPHOS, BILITOT, PROT, ALBUMIN in the last 168 hours. No results for input(s): LIPASE, AMYLASE in the last 168 hours. No results for input(s): AMMONIA in the last 168 hours. CBC: Recent Labs  Lab 05/27/18 1814 05/28/18 0647 05/30/18 0147  WBC 12.7* 12.6* 13.7*  HGB 13.7 13.7 13.2  HCT 43.1 41.7 39.7  MCV 92.9 91.4 90.8  PLT 343 299 296   Cardiac Enzymes: Recent Labs  Lab 05/28/18 0318 05/28/18 0647 05/28/18 1352  TROPONINI 0.10* 0.08* 0.05*   BNP: BNP (last 3 results) No results for input(s): BNP in the last 8760 hours.  ProBNP (last 3 results) No results for input(s): PROBNP in the last 8760 hours.  CBG: No results for input(s): GLUCAP in the last 168 hours.     Signed:  Florencia Reasons MD, PhD  [facility] 05/30/2018, 12:21 PM

## 2018-05-30 NOTE — Progress Notes (Addendum)
The patient has been seen in conjunction with Ina Homes, MD. All aspects of care have been considered and discussed. The patient has been personally interviewed, examined, and all clinical data has been reviewed.   Patient had second-degree AV block after administration of carvedilol, transiently, and was asymptomatic.  Beta-blocker therapy was discontinued.  Amlodipine is been added for blood pressure control.  We will arrange cardiology follow-up with Dr. [name].  Access site is unremarkable.  The patient is ready for discharge.   Progress Note  Patient Name: Gabriel Aguilar Date of Encounter: 05/30/2018  Primary Cardiologist: [name], MD  Subjective   Patient feeling well this AM. Has walked with cardiac rehab. No CP or discomfort with ambulation. Discussed plan from cardiac standpoint. All questions and concerns addressed.   Inpatient Medications    Scheduled Meds: . atorvastatin  80 mg Oral q1800  . carvedilol  3.125 mg Oral BID WC  . clopidogrel  75 mg Oral Q breakfast  . fenofibrate  54 mg Oral Daily  . multivitamin with minerals   Oral Daily  . pantoprazole  40 mg Oral Daily  . rivaroxaban  20 mg Oral Q supper  . sodium chloride flush  3 mL Intravenous Q12H   Continuous Infusions: . sodium chloride     PRN Meds: sodium chloride, acetaminophen **OR** acetaminophen, morphine injection, nitroGLYCERIN, ondansetron **OR** ondansetron (ZOFRAN) IV, senna-docusate, sodium chloride flush   Vital Signs    Vitals:   05/29/18 1530 05/29/18 1600 05/29/18 2110 05/30/18 0501  BP: (!) 105/59 111/61 (!) 154/70 125/68  Pulse: (!) 56 (!) 55 64 62  Resp: (!) 21 19 18 10   Temp:   97.8 F (36.6 C) 98.1 F (36.7 C)  TempSrc:   Oral Oral  SpO2: 96% 97% 99% 97%  Weight:      Height:        Intake/Output Summary (Last 24 hours) at 05/30/2018 0842 Last data filed at 05/29/2018 2100 Gross per 24 hour  Intake 3 ml  Output -  Net 3 ml   Filed Weights   05/28/18 0412  Weight: 98.4 kg   Telemetry    NSR - Personally Reviewed  ECG    No new ECG  Physical Exam   Today's Vitals   05/29/18 1600 05/29/18 2100 05/29/18 2110 05/30/18 0501  BP: 111/61  (!) 154/70 125/68  Pulse: (!) 55  64 62  Resp: 19  18 10   Temp:   97.8 F (36.6 C) 98.1 F (36.7 C)  TempSrc:   Oral Oral  SpO2: 97%  99% 97%  Weight:      Height:      PainSc: 0-No pain 0-No pain     Body mass index is 32.98 kg/m.  GEN: No acute distress.   Neck: No JVD Cardiac: RRR, no murmurs, rubs, or gallops.  Respiratory: Clear to auscultation bilaterally. GI: Soft, nontender, non-distended  MS: No edema; No deformity. Neuro:  Nonfocal  Psych: Normal affect   Labs    Chemistry Recent Labs  Lab 05/27/18 1814 05/28/18 0647 05/30/18 0147  NA 137 139 137  K 4.2 4.2 4.3  CL 103 104 104  CO2 24 26 25   GLUCOSE 116* 97 100*  BUN 19 16 14   CREATININE 1.16 1.12 1.19  CALCIUM 9.5 9.2 9.3  GFRNONAA >60 >60 >60  GFRAA >60 >60 >60  ANIONGAP 10 9 8     Hematology Recent Labs  Lab 05/27/18 1814 05/28/18 0647 05/30/18 0147  WBC 12.7* 12.6*  13.7*  RBC 4.64 4.56 4.37  HGB 13.7 13.7 13.2  HCT 43.1 41.7 39.7  MCV 92.9 91.4 90.8  MCH 29.5 30.0 30.2  MCHC 31.8 32.9 33.2  RDW 13.1 13.1 13.1  PLT 343 299 296   Cardiac Enzymes Recent Labs  Lab 05/28/18 0318 05/28/18 0647 05/28/18 1352  TROPONINI 0.10* 0.08* 0.05*    Recent Labs  Lab 05/27/18 1820  TROPIPOC 0.02    BNPNo results for input(s): BNP, PROBNP in the last 168 hours.   DDimer No results for input(s): DDIMER in the last 168 hours.   Radiology    No results found.   Cardiac Studies   Left Heart Cath 05/29/2018  The left ventricular systolic function is normal.  LV end diastolic pressure is mildly elevated.  The left ventricular ejection fraction is 55-65% by visual estimate.  Mid Cx lesion is 95% stenosed.  Post intervention, there is a 0% residual stenosis.  A drug-eluting stent  was successfully placed using a STENT SYNERGY DES 2.5X20.  Mid LAD lesion is 40% stenosed.  Prox LAD lesion is 20% stenosed.  Prox RCA lesion is 20% stenosed.  Mid RCA lesion is 99% stenosed.  Post intervention, there is a 0% residual stenosis.  A drug-eluting stent was successfully placed using a STENT SYNERGY DES 2.75X20.  Transthoracic Echo 05/28/2018  1. The left ventricle has normal systolic function with an ejection fraction of 60-65%. The cavity size was normal. Left ventricular diastolic parameters were normal No evidence of left ventricular regional wall motion abnormalities.  2. No evidence of left ventricular regional wall motion abnormalities.  3. The right ventricle has normal systolic function. The cavity was normal. There is no increase in right ventricular wall thickness.  4. The mitral valve is normal in structure.  5. The tricuspid valve is normal in structure.  6. The aortic valve was not well visualized.  Patient Profile     Gabriel Aguilar is a 68 y.o. male with a hx of HLD, GERD, PAF, OSA, and pericarditis who is being seen for the evaluation of chest pressure.  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 Will discuss with Dr. Tamala Julian  - Continue Plavix 75 mg QD, Xarelto 20 mg QD, Carvedilol 3.125 mg BID, and Atorvastatin 80 mg - Start lisinopril 5 mg   Hypertension  - BP goal <130/80  - Continue Carvedilol 3.125 mg BID - Start Lisinopril 5 mg   Paroxysmal Atrial Fibrillation  - Tele with NSR  - Continue Carvedilol 3.125 mg BID - Restart Xarelto. CHADsVASC of 2   Will discuss the case further with Dr. Tamala Julian   For questions or updates, please contact [facility] Please consult [email or web address] for contact info under Cardiology/STEMI.   Signed, Ina Homes, MD  05/30/2018, 8:42 AM

## 2018-05-30 NOTE — Plan of Care (Signed)

## 2018-05-31 ENCOUNTER — Telehealth: Payer: Self-pay | Admitting: *Deleted

## 2018-05-31 NOTE — Telephone Encounter (Signed)
Transition Care Management Follow-up Telephone Call   Date discharged?  05/30/2018   How have you been since you were released from the hospital?" I've been fine"    Do you understand why you were in the hospital? Yes    Do you understand the discharge instructions? Yes    Where were you [facility] to? [facility]    Items Reviewed:  Medications reviewed: Yes   Allergies reviewed: yes  Dietary changes reviewed: yes (No restrictions but does have things he needs to work on)  Referrals reviewed: yes   Functional Questionnaire:   Activities of Daily Living (ADLs):   He states they are independent in the following: ambulation, bathing and hygiene, feeding, continence, grooming, toileting and dressing States they require assistance with the following: dressingshower d/t right arm immobile from procedure    Any transportation issues/concerns?: No    Any patient concerns?No    Confirmed importance and date/time of follow-up visits scheduled  Yes   Provider Appointment booked with Dr. Sarajane Jews 06/03/2018 at 3:45 PM   Confirmed with patient if condition begins to worsen call PCP or go to the ER.  Patient was given the office number and encouraged to call back with question or concerns.  : Yes

## 2018-06-03 ENCOUNTER — Encounter: Payer: Self-pay | Admitting: Family Medicine

## 2018-06-03 ENCOUNTER — Ambulatory Visit (INDEPENDENT_AMBULATORY_CARE_PROVIDER_SITE_OTHER): Payer: PPO | Admitting: Family Medicine

## 2018-06-03 VITALS — BP 110/66 | HR 72 | Temp 98.5°F | Ht 68.5 in | Wt 215.2 lb

## 2018-06-03 DIAGNOSIS — Z955 Presence of coronary angioplasty implant and graft: Secondary | ICD-10-CM

## 2018-06-03 DIAGNOSIS — D72829 Elevated white blood cell count, unspecified: Secondary | ICD-10-CM

## 2018-06-03 DIAGNOSIS — I480 Paroxysmal atrial fibrillation: Secondary | ICD-10-CM | POA: Diagnosis not present

## 2018-06-03 NOTE — Progress Notes (Signed)
   Subjective:    Patient ID: Gabriel Aguilar, male    DOB: 19-May-1950, 68 y.o.   MRN: [id number]  HPI Here to follow up a hospital stay from 05-27-18 to 05-30-18 for chest pressure and SOB on exertion. His initial workup was unremarkable and enzymes were normal. He was kept for observation and wound up have a cardiac catheterization. This revealed CAD and he now has drug eluting stents in the RCA and CX. His ECHO showed an EF of 60-65%. He has felt fine since then except for 2 brief instances of chest pressure. He has not taken any NTG yet. He is on both Plavix and Xarelto. He has been taking walks around his neighborhood with his wife and has done well. He is scheduled to see Almyra Deforest in the Cardiology office on 06-13-18. Of note we had referred him to Hematology on 05-16-18 for persistent leukocytosis. This was again seen in the hospital. He has not heard from the Hematology office yet.    Review of Systems  Constitutional: Negative.   Respiratory: Positive for shortness of breath. Negative for cough and wheezing.   Cardiovascular: Positive for chest pain. Negative for palpitations and leg swelling.  Neurological: Negative.        Objective:   Physical Exam Constitutional:      General: He is not in acute distress.    Appearance: Normal appearance.  Cardiovascular:     Rate and Rhythm: Normal rate and regular rhythm.     Pulses: Normal pulses.     Heart sounds: Normal heart sounds.     Comments: Cath site at the right wrist is clean, bandage is in place  Pulmonary:     Effort: Pulmonary effort is normal.     Breath sounds: Normal breath sounds.  Musculoskeletal:     Right lower leg: No edema.     Left lower leg: No edema.  Neurological:     General: No focal deficit present.     Mental Status: He is alert and oriented to person, place, and time.           Assessment & Plan:  His CAD seems to be stable. He will follow up with Cardiology as scheduled. They will set him up for  cardiac rehab. We will get a CBC and a BMET today. I gave him the number to call Hematology to check on the process of our referral.  Alysia Penna, MD

## 2018-06-04 ENCOUNTER — Telehealth (HOSPITAL_COMMUNITY): Payer: Self-pay

## 2018-06-04 LAB — BASIC METABOLIC PANEL
BUN: 22 mg/dL (ref 6–23)
CO2: 29 mEq/L (ref 19–32)
Calcium: 9.8 mg/dL (ref 8.4–10.5)
Chloride: 100 mEq/L (ref 96–112)
Creatinine, Ser: 1.39 mg/dL (ref 0.40–1.50)
GFR: 50.59 mL/min — ABNORMAL LOW (ref >60.00)
Glucose, Bld: 93 mg/dL (ref 70–99)
Potassium: 4.3 mEq/L (ref 3.5–5.1)
SODIUM: 137 meq/L (ref 135–145)

## 2018-06-04 LAB — CBC WITH DIFFERENTIAL/PLATELET
Basophils Absolute: 0.2 10*3/uL — ABNORMAL HIGH (ref 0.0–0.1)
Basophils Relative: 1.2 % (ref 0.0–3.0)
Eosinophils Absolute: 0.2 10*3/uL (ref 0.0–0.7)
Eosinophils Relative: 1.7 % (ref 0.0–5.0)
HCT: 41.8 % (ref 39.0–52.0)
Hemoglobin: 14.2 g/dL (ref 13.0–17.0)
LYMPHS PCT: 45.5 % (ref 12.0–46.0)
Lymphs Abs: 5.8 10*3/uL — ABNORMAL HIGH (ref 0.7–4.0)
MCHC: 33.9 g/dL (ref 30.0–36.0)
MCV: 92.1 fl (ref 78.0–100.0)
MONOS PCT: 7.7 % (ref 3.0–12.0)
Monocytes Absolute: 1 10*3/uL (ref 0.1–1.0)
Neutro Abs: 5.6 10*3/uL (ref 1.4–7.7)
Neutrophils Relative %: 43.9 % (ref 43.0–77.0)
Platelets: 378 10*3/uL (ref 150.0–400.0)
RBC: 4.54 Mil/uL (ref 4.22–5.81)
RDW: 13.9 % (ref 11.5–15.5)
WBC: 12.8 10*3/uL — ABNORMAL HIGH (ref 4.0–10.5)

## 2018-06-04 NOTE — Telephone Encounter (Signed)
Pt insurance is active and benefits verified through [employer] adv Co-pay $15.00, DED $0.00/0 met, out of pocket $3,400/0 met, co-insurance 0%. no pre-authorization required, REF# [id number]  Tedra Senegal. Support Rep II

## 2018-06-04 NOTE — Telephone Encounter (Signed)
Called patient to see if he is interested in the Cardiac Rehab Program. Patient expressed interest. Explained scheduling process and went over insurance, patient verbalized understanding. Will contact patient for scheduling once f/u has been completed. °Gloria W. Support Rep II °

## 2018-06-07 ENCOUNTER — Encounter: Payer: Self-pay | Admitting: *Deleted

## 2018-06-10 ENCOUNTER — Other Ambulatory Visit: Payer: Self-pay | Admitting: Cardiology

## 2018-06-13 ENCOUNTER — Encounter: Payer: Self-pay | Admitting: Physician Assistant

## 2018-06-13 ENCOUNTER — Ambulatory Visit: Payer: PPO | Admitting: Physician Assistant

## 2018-06-13 VITALS — BP 145/72 | HR 74 | Ht 68.0 in | Wt 217.0 lb

## 2018-06-13 DIAGNOSIS — I10 Essential (primary) hypertension: Secondary | ICD-10-CM | POA: Diagnosis not present

## 2018-06-13 DIAGNOSIS — I2510 Atherosclerotic heart disease of native coronary artery without angina pectoris: Secondary | ICD-10-CM | POA: Diagnosis not present

## 2018-06-13 DIAGNOSIS — G4733 Obstructive sleep apnea (adult) (pediatric): Secondary | ICD-10-CM

## 2018-06-13 DIAGNOSIS — Q600 Renal agenesis, unilateral: Secondary | ICD-10-CM | POA: Diagnosis not present

## 2018-06-13 DIAGNOSIS — I480 Paroxysmal atrial fibrillation: Secondary | ICD-10-CM | POA: Diagnosis not present

## 2018-06-13 DIAGNOSIS — IMO0002 Reserved for concepts with insufficient information to code with codable children: Secondary | ICD-10-CM

## 2018-06-13 DIAGNOSIS — E781 Pure hyperglyceridemia: Secondary | ICD-10-CM

## 2018-06-13 MED ORDER — AMLODIPINE BESYLATE 10 MG PO TABS: 10.0000 mg | ORAL_TABLET | Freq: Every day | ORAL | 3 refills | Status: DC

## 2018-06-13 MED ORDER — FENOFIBRATE 54 MG PO TABS: 54.0000 mg | ORAL_TABLET | Freq: Every day | ORAL | 3 refills | Status: DC

## 2018-06-13 MED ORDER — CLOPIDOGREL BISULFATE 75 MG PO TABS: 75.0000 mg | ORAL_TABLET | Freq: Every day | ORAL | 3 refills | Status: DC

## 2018-06-13 NOTE — Patient Instructions (Signed)
Medication Instructions:  INCREASE AMLODIPINE TO 10 MG DAILY  If you need a refill on your cardiac medications before your next appointment, please call your pharmacy.   Lab work: In 2 months (May 2020) you will need to return to have labs (blood work) drawn: Lipid and Liver  If you have labs (blood work) drawn today and your tests are completely normal, you will receive your results only by: [name] MyChart Message (if you have MyChart) OR . A paper copy in the mail If you have any lab test that is abnormal or we need to change your treatment, we will call you to review the results.  Testing/Procedures: NONE  Follow-Up: At [facility], you and your health needs are our priority.  As part of our continuing mission to provide you with exceptional heart care, we have created designated Provider Care Teams.  These Care Teams include your primary Cardiologist (physician) and Advanced Practice Providers (APPs -  Physician Assistants and Nurse Practitioners) who all work together to provide you with the care you need, when you need it. You will need a follow up appointment in 3 months.  Please call our office 2 months in advance to schedule this appointment.  You may see [name], MD or one of the following Advanced Practice Providers on your designated Care Team:   Rosaria Ferries, PA-C . Jory Sims, DNP, ANP  Any Other Special Instructions Will Be Listed Below (If Applicable).

## 2018-06-13 NOTE — Progress Notes (Signed)
Cardiology Office Note    Date:  06/15/2018   ID:  Gabriel Aguilar, DOB 1950-07-31, MRN [id number]  PCP:  Laurey Morale, MD  Cardiologist: Dr. [name] Dr. Rayann Heman  Chief Complaint  Patient presents with  . Follow-up    seen for Dr. [name].     History of Present Illness:  Gabriel Aguilar is a 68 y.o. 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  Patient was in his usual state of health until early February when he began to experience progressive exertional dyspnea and chest pressure.  He eventually sought medical attention on 05/27/2018.  Echocardiogram obtained on the following day showed EF of 60 to 65%, no significant valvular issue.  He eventually underwent cardiac catheterization on 05/29/2018 which revealed 95% mid left circumflex lesion treated successfully with 2.5 x 20 mm Synergy DES, 40% mid LAD lesion, 99% mid RCA lesion treated with 2.75 x 20 mm DES.  Xarelto was resumed at the following day.  Postprocedure, he was placed on Plavix.  Aspirin was stopped the day he restarted on the Xarelto.  Beta-blocker was discontinued as patient had transient second-degree AV block after administration of carvedilol.  Patient presents today for cardiology office visit.  He is doing very well on the current therapy.  He is compliant with Plavix and Xarelto.  He denies any further chest discomfort or shortness of breath.  He has no lower extremity edema, orthopnea or PND.  His blood pressure is elevated today, I will increase amlodipine to 10 mg daily.  I have refilled his Plavix and fenofibrate.  He is triglyceride remains quite elevated, he will need 71-month fasting lipid panel and LFT after starting on the fenofibrate.  He can return in 41-month for follow-up.  Past Medical History:  Diagnosis Date  . Arthritis   . Blood pressure elevated without history of HTN    . Erectile dysfunction   . GERD (gastroesophageal reflux disease)   . Hiatal hernia   . History of pericarditis   . Hyperlipidemia   . Hypogonadism male    has seen  Gabriel Aguilar  . Palpitation    a. montior 2 weeks 2014--> no arrhythmia  . Paroxysmal atrial fibrillation (HCC)    sees Gabriel Aguilar   . Sleep apnea, central    wears a CPAP at night     Past Surgical History:  Procedure Laterality Date  . BACK SURGERY  1994   L4-5  . COLONOSCOPY  07-09-12   per Dr. Deatra Ina, adenomatous polyp, repeat in 5 yrs   . CORONARY STENT INTERVENTION N/A 05/29/2018   Procedure: CORONARY STENT INTERVENTION;  Surgeon: Wellington Hampshire, MD;  Location: [facility];  Service: Cardiovascular;  Laterality: N/A;  RCA  . EYE SURGERY     LASIK  . LEFT HEART CATH AND CORONARY ANGIOGRAPHY N/A 05/29/2018   Procedure: LEFT HEART CATH AND CORONARY ANGIOGRAPHY;  Surgeon: Wellington Hampshire, MD;  Location: [facility];  Service: Cardiovascular;  Laterality: N/A;  . 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 SPINE SURGERY  1994   disc at L4-L5  . VASECTOMY      Current Medications: Outpatient Medications Prior to Visit  Medication Sig Dispense Refill  . Ascorbic Acid (VITAMIN C) 1000 MG tablet Take 1,000 mg by mouth daily.    [name]  atorvastatin (LIPITOR) 80 MG tablet TAKE 1 TABLET BY MOUTH ONCE DAILY AT  6PM. PLEASE SCHEDULE APPOINTMENT FOR FUTURE REFILLS 90 tablet 0  . Misc Natural Products (FOCUSED MIND PO) Take 1 tablet by mouth daily.     . Multiple Vitamin (MULTI VITAMIN PO) Take 1 tablet by mouth daily.    . nitroGLYCERIN (NITROSTAT) 0.4 MG SL tablet Place 1 tablet (0.4 mg total) under the tongue every 5 (five) minutes as needed for chest pain. 30 tablet 0  . OMEGA 3 1000 MG CAPS Take 1 capsule by mouth daily.     [name] omeprazole (PRILOSEC) 40 MG capsule TAKE 1 CAPSULE BY MOUTH DAILY (Patient taking differently: Take 40 mg by mouth daily. ) 90 capsule 3  .  oxymetazoline (AFRIN) 0.05 % nasal spray Place 1 spray into both nostrils at bedtime.    . rivaroxaban (XARELTO) 20 MG TABS tablet Take 20 mg by mouth daily with supper.    . vitamin B-12 (CYANOCOBALAMIN) 100 MCG tablet Take 100 mcg by mouth daily as needed.    . vitamin E (VITAMIN E) 400 UNIT capsule Take 1,200 Units by mouth daily.    [name] amLODipine (NORVASC) 5 MG tablet Take 1 tablet (5 mg total) by mouth daily. 30 tablet 0  . clopidogrel (PLAVIX) 75 MG tablet Take 1 tablet (75 mg total) by mouth daily with breakfast. 30 tablet 0  . fenofibrate 54 MG tablet Take 1 tablet (54 mg total) by mouth daily. 30 tablet 0   No facility-administered medications prior to visit.      Allergies:   Patient has no known allergies.   Social History   Socioeconomic History  . Marital status: Married    Spouse name: Not on file  . Number of children: 2  . Years of education: Not on file  . Highest education level: Not on file  Occupational History  . 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 Financial resource strain: Not hard at all  . Food insecurity:    Worry: Never true    Inability: Never true  . Transportation needs:    Medical: No    Non-medical: No  Tobacco Use  . Smoking status: Former Smoker    Packs/day: 1.50    Start date: 04/10/1964    Last attempt to quit: 03/06/1983    Years since quitting: 35.3  . Smokeless tobacco: Never Used  Substance and Sexual Activity  . Alcohol use: Yes    Alcohol/week: 1.0 standard drinks    Types: 1 Glasses of wine per week    Comment: rare  . Drug use: No  . Sexual activity: Not on file  Lifestyle  . Physical activity:    Days per week: 5 days    Minutes per session: 30 min  . Stress: Not at all  Relationships  . Social connections:    Talks on phone: Twice a week    Gets together: Once a week    Attends religious service: Not on file    Active member of club or organization: Yes    Attends meetings  of clubs or organizations: More than 4 times per year    Relationship status: Married  Other Topics Concern  . Not on file  Social History Narrative   05/13/2018: Lives with wife in multi-level home, considering down-sizing in next few years, has two grown children.   Not quite ready to retire.   Loves to  travel, planning [place] trip with wife in April   Goes to [place], uses elliptical and [employer]     Family History:  The patient's  family history includes CVA in his brother and brother; Diabetes in his father; Heart attack in his sister; Heart disease in his father; Heart disease (age of onset: 72) in his mother; Hyperlipidemia (age of onset: 9) in his father; Hypertension in his father; Kidney disease in his father; Other in his brother, sister, and sister.   ROS:   Please see the history of present illness.    ROS All other systems reviewed and are negative.   PHYSICAL EXAM:   VS:  BP (!) 145/72   Pulse 74   Ht 5\' 8"  (1.727 m)   Wt 217 lb (98.4 kg)   BMI 32.99 kg/m    GEN: Well nourished, well developed, in no acute distress  HEENT: normal  Neck: no JVD, carotid bruits, or masses Cardiac: RRR; no murmurs, rubs, or gallops,no edema  Respiratory:  clear to auscultation bilaterally, normal work of breathing GI: soft, nontender, nondistended, + BS MS: no deformity or atrophy  Skin: warm and dry, no rash Neuro:  Alert and Oriented x 3, Strength and sensation are intact Psych: euthymic mood, full affect  Wt Readings from Last 3 Encounters:  06/13/18 217 lb (98.4 kg)  06/03/18 215 lb 4 oz (97.6 kg)  05/28/18 216 lb 14.9 oz (98.4 kg)      Studies/Labs Reviewed:   EKG:  EKG is ordered today.  The ekg ordered today demonstrates normal sinus rhythm  Recent Labs: 05/14/2018: ALT 33; TSH 0.89 06/03/2018: BUN 22; Creatinine, Ser 1.39; Hemoglobin 14.2; Platelets 378.0; Potassium 4.3; Sodium 137   Lipid Panel    Component Value Date/Time   CHOL 171 05/28/2018 0318   TRIG 307 (H)  05/28/2018 0318   HDL 37 (L) 05/28/2018 0318   CHOLHDL 4.6 05/28/2018 0318   VLDL 61 (H) 05/28/2018 0318   LDLCALC 73 05/28/2018 0318   LDLDIRECT 97.0 05/14/2018 0755    Additional studies/ records that were reviewed today include:   Echo 05/28/2018   1. The left ventricle has normal systolic function with an ejection fraction of 60-65%. The cavity size was normal. Left ventricular diastolic parameters were normal No evidence of left ventricular regional wall motion abnormalities.  2. No evidence of left ventricular regional wall motion abnormalities.  3. The right ventricle has normal systolic function. The cavity was normal. There is no increase in right ventricular wall thickness.  4. The mitral valve is normal in structure.  5. The tricuspid valve is normal in structure.  6. The aortic valve was not well visualized.   Cath 05/29/2018  The left ventricular systolic function is normal.  LV end diastolic pressure is mildly elevated.  The left ventricular ejection fraction is 55-65% by visual estimate.  Mid Cx lesion is 95% stenosed.  Post intervention, there is a 0% residual stenosis.  A drug-eluting stent was successfully placed using a STENT SYNERGY DES 2.5X20.  Mid LAD lesion is 40% stenosed.  Prox LAD lesion is 20% stenosed.  Prox RCA lesion is 20% stenosed.  Mid RCA lesion is 99% stenosed.  Post intervention, there is a 0% residual stenosis.  A drug-eluting stent was successfully placed using a STENT SYNERGY DES 2.75X20.   1.  Severe two-vessel coronary artery disease involving RCA and left circumflex.  Moderate LAD disease. 2.  Normal LV systolic function mildly elevated left ventricular end-diastolic pressure. 3.  Successful angioplasty and drug-eluting stent placement to the mid right coronary artery and mid left circumflex.  Recommendations: Xarelto can be resumed tomorrow if no bleeding complications.  Once Xarelto is resumed, aspirin can be stopped and  Plavix should be continued for at least a year. Recommend aggressive treatment of risk factors.   ASSESSMENT:    1. Coronary artery disease involving native coronary artery of native heart without angina pectoris   2. Hypertriglyceridemia   3. Solitary kidney   4. PAF (paroxysmal atrial fibrillation) ([place])   5. OSA (obstructive sleep apnea)   6. Essential hypertension      PLAN:  In order of problems listed above:  1. CAD: Recently recently underwent DES to mid left circumflex and also mid RCA.  Not on aspirin given the need for Xarelto.  Continue Plavix along with Xarelto.  He is not on beta-blocker due to transient second-degree AV block after started on carvedilol in the hospital.  2. Hypertriglyceridemia: Continue statin and fenofibrate.  Recheck fasting lipid panel and LFT  3. History of solitary kidney: Renal function stable  4. PAF: On Xarelto, no recurrence of atrial fibrillation  5. Hypertension: Blood pressure elevated today, increase amlodipine to 10 mg daily    Medication Adjustments/Labs and Tests Ordered: Current medicines are reviewed at length with the patient today.  Concerns regarding medicines are outlined above.  Medication changes, Labs and Tests ordered today are listed in the Patient Instructions below. Patient Instructions  Medication Instructions:  INCREASE AMLODIPINE TO 10 MG DAILY  If you need a refill on your cardiac medications before your next appointment, please call your pharmacy.   Lab work: In 2 months (May 2020) you will need to return to have labs (blood work) drawn: Lipid and Liver  If you have labs (blood work) drawn today and your tests are completely normal, you will receive your results only by: [name] MyChart Message (if you have MyChart) OR . A paper copy in the mail If you have any lab test that is abnormal or we need to change your treatment, we will call you to review the results.  Testing/Procedures: NONE  Follow-Up: At [facility], you and your health needs are our priority.  As part of our continuing mission to provide you with exceptional heart care, we have created designated Provider Care Teams.  These Care Teams include your primary Cardiologist (physician) and Advanced Practice Providers (APPs -  Physician Assistants and Nurse Practitioners) who all work together to provide you with the care you need, when you need it. You will need a follow up appointment in 3 months.  Please call our office 2 months in advance to schedule this appointment.  You may see [name], MD or one of the following Advanced Practice Providers on your designated Care Team:   Rosaria Ferries, PA-C . Jory Sims, DNP, ANP  Any Other Special Instructions Will Be Listed Below (If Applicable).       Hilbert Corrigan, [place]  06/15/2018 11:35 PM    [facility] [address], [place], [place]  [address] Phone: [phone]; Fax: [phone]

## 2018-06-15 ENCOUNTER — Encounter: Payer: Self-pay | Admitting: Physician Assistant

## 2018-06-17 ENCOUNTER — Telehealth: Payer: Self-pay | Admitting: Oncology

## 2018-06-17 ENCOUNTER — Other Ambulatory Visit: Payer: Self-pay | Admitting: *Deleted

## 2018-06-17 DIAGNOSIS — D72829 Elevated white blood cell count, unspecified: Secondary | ICD-10-CM

## 2018-06-17 NOTE — Telephone Encounter (Signed)
A new hem appt has been scheduled for the pt to see Dr. Jana Hakim on 3/12 at 830am w/labs at 8am. Pt aware to arrive 15 minutes early.

## 2018-06-19 DIAGNOSIS — D72829 Elevated white blood cell count, unspecified: Secondary | ICD-10-CM | POA: Diagnosis not present

## 2018-06-19 NOTE — Progress Notes (Signed)
Gabriel Aguilar  [phone] [phone]    ID: Gabriel Aguilar DOB: [birth year]  MR#: [id number]  KWI#:[id number]  Patient Care Team: Laurey Morale, MD as PCP - General (Family Medicine) [name], MD as PCP - Cardiology (Cardiology) Thompson Grayer, MD as PCP - Electrophysiology (Cardiology) Thompson Grayer, MD as Consulting Physician (Cardiology) Rigoberto Noel, MD as Consulting Physician (Pulmonary Disease) OTHER MD:   CHIEF COMPLAINT: Lymphocytosis  CURRENT TREATMENT: Work-up in progress   HISTORY OF CURRENT ILLNESS: Gabriel Aguilar was referred by Dr Sarajane Jews for evaluation and treatment of leukocytosis.   We have CBCs dating back to 02/16/2011, when his total white cell count was 10.6, with a hemoglobin of 14.3, MCV 93.4, and platelets 340,000.  Over the next several years all but 1 reading has been over 10.0, with one reading of 23,000 occurring when the patient had pericarditis, but other readings as high as 15.3 in the absence of any obvious infection.  Differentials have shown a consistently elevated lymphocyte count, being 4.8 at 02/16/2011 and 5.8 most recently, with no reading below 3.0 in between.  There has been no anemia or thrombocytopenia.  The patient's subsequent history is as detailed below.   INTERVAL HISTORY: Gabriel Aguilar was evaluated in the hematology clinic on 06/20/2018.   REVIEW OF SYSTEMS: Gabriel Aguilar goes to the gym and walks every morning for exercise. He notes that he has had a stable weight. Gabriel Aguilar does wake up in the middle of the night, usually around 3 or 4 am, with diaphoresis, but he notes that his wife also wakes up and that they keep their house around 72 degrees Farenheit. Gabriel Aguilar has been on blood thinners with no bleeding or bruising.   The patient denies unusual headaches, visual changes, nausea, vomiting, stiff neck, dizziness, or gait imbalance. There has been no cough, phlegm production, or pleurisy, no chest pain or pressure,  and no change in bowel or bladder habits. The patient denies fever, rash, bleeding, unexplained fatigue or unexplained weight loss. A detailed review of systems was otherwise entirely negative.   PAST MEDICAL HISTORY: Past Medical History:  Diagnosis Date  . Arthritis   . Blood pressure elevated without history of HTN   . Erectile dysfunction   . GERD (gastroesophageal reflux disease)   . Hiatal hernia   . History of pericarditis   . Hyperlipidemia   . Hypogonadism male    has seen  Dr. Orland Aguilar  . Palpitation    a. montior 2 weeks 2014--> no arrhythmia  . Paroxysmal atrial fibrillation (HCC)    sees Dr. Adrian Prows   . Sleep apnea, central    wears a CPAP at night      PAST SURGICAL HISTORY: Past Surgical History:  Procedure Laterality Date  . BACK SURGERY  1994   L4-5  . COLONOSCOPY  07-09-12   per Dr. Deatra Ina, adenomatous polyp, repeat in 5 yrs   . CORONARY STENT INTERVENTION N/A 05/29/2018   Procedure: CORONARY STENT INTERVENTION;  Surgeon: Wellington Hampshire, MD;  Location: [facility];  Service: Cardiovascular;  Laterality: N/A;  RCA  . EYE SURGERY     LASIK  . LEFT HEART CATH AND CORONARY ANGIOGRAPHY N/A 05/29/2018   Procedure: LEFT HEART CATH AND CORONARY ANGIOGRAPHY;  Surgeon: Wellington Hampshire, MD;  Location: [facility];  Service: Cardiovascular;  Laterality: N/A;  . 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 [place]  disc at L4-L5  . VASECTOMY       FAMILY HISTORY: Family History  Problem Relation Age of Onset  . Hyperlipidemia Aguilar 51       KIDNEY FAILURE  . Heart disease Aguilar   . Hypertension Aguilar   . Kidney disease Aguilar   . Diabetes Aguilar   . Heart disease Aguilar 10  . Other Sister        LIVER FAILURE  . CVA Brother   . Other Sister        AT BIRTH  . Heart attack Sister   . CVA Brother   . Other Brother        SEVERAL CABG  . Colon cancer Neg Hx    Gabriel Aguilar died from  issues with a polycystic kidney at age 78. Gabriel Aguilar died from a myocardial infarction at age 21. The patient has 5 brothers and 2 sisters. Patient denies anyone in his family having breast, ovarian, prostate, or pancreatic cancer. Kelton's brother, Gabriel Aguilar, was diagnosed with pancreatic cancer in his 58's.  Another brother "has a high white count".   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 His wife, Gabriel Aguilar, works for [employer]. Sabien has two blood children and one step-child. 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 Glendell's son is an Optometrist in [place]. 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 Cloyce has 5 grandchildren. He attends the [employer].   ADVANCED DIRECTIVES: Atul's wife, Gabriel Aguilar, is automatically his healthcare power of attorney.    HEALTH MAINTENANCE: Social History   Tobacco Use  . Smoking status: Former Smoker    Packs/day: 1.50    Start date: 04/10/1964    Last attempt to quit: 03/06/1983    Years since quitting: 35.3  . Smokeless tobacco: Never Used  Substance Use Topics  . Alcohol use: Yes    Alcohol/week: 1.0 standard drinks    Types: 1 Glasses of wine per week    Comment: rare  . Drug use: No    Colonoscopy: overdue  PSA:   Bone density:   No Known Allergies  Current Outpatient Medications  Medication Sig Dispense Refill  . amLODipine (NORVASC) 10 MG tablet Take 1 tablet (10 mg total) by mouth daily. 90 tablet 3  . Ascorbic Acid (VITAMIN C) 1000 MG tablet Take 1,000 mg by mouth daily.    [name] atorvastatin (LIPITOR) 80 MG tablet TAKE 1 TABLET BY MOUTH ONCE DAILY AT  6PM. PLEASE SCHEDULE APPOINTMENT FOR FUTURE REFILLS 90 tablet 0  . clopidogrel (PLAVIX) 75 MG tablet Take 1 tablet (75 mg total) by mouth daily with breakfast. 90 tablet 3  . fenofibrate 54 MG tablet Take 1 tablet (54 mg total) by mouth daily. 90 tablet 3  . Misc Natural Products (FOCUSED MIND PO)  Take 1 tablet by mouth daily.     . Multiple Vitamin (MULTI VITAMIN PO) Take 1 tablet by mouth daily.    . nitroGLYCERIN (NITROSTAT) 0.4 MG SL tablet Place 1 tablet (0.4 mg total) under the tongue every 5 (five) minutes as needed for chest pain. 30 tablet 0  . OMEGA 3 1000 MG CAPS Take 1 capsule by mouth daily.     [name] omeprazole (PRILOSEC) 40 MG capsule TAKE 1 CAPSULE BY MOUTH DAILY (Patient taking differently: Take 40 mg by mouth daily. ) 90 capsule 3  . oxymetazoline (AFRIN) 0.05 % nasal spray Place 1 spray into both nostrils at bedtime.    [name]  rivaroxaban (XARELTO) 20 MG TABS tablet Take 20 mg by mouth daily with supper.    . vitamin B-12 (CYANOCOBALAMIN) 100 MCG tablet Take 100 mcg by mouth daily as needed.    . vitamin E (VITAMIN E) 400 UNIT capsule Take 1,200 Units by mouth daily.     No current facility-administered medications for this visit.      OBJECTIVE: Middle-aged white man who appears well  Vitals:   06/20/18 0833  BP: (!) 151/90  Pulse: (!) 105  Resp: 20  Temp: 98.1 F (36.7 C)  SpO2: 100%     Body mass index is 32.54 kg/m.   Wt Readings from Last 3 Encounters:  06/20/18 214 lb (97.1 kg)  06/13/18 217 lb (98.4 kg)  06/03/18 215 lb 4 oz (97.6 kg)      ECOG FS:0 - Asymptomatic  Ocular: Sclerae unicteric, pupils round and equal Ear-nose-throat: Oropharynx clear and moist Lymphatic: No cervical or supraclavicular adenopathy Lungs no rales or rhonchi Heart regular rate and rhythm Abd soft, nontender, positive bowel sounds MSK no focal spinal tenderness, no joint edema Neuro: non-focal, well-oriented, appropriate affect    LAB RESULTS:  CMP     Component Value Date/Time   NA 137 06/03/2018 1650   K 4.3 06/03/2018 1650   CL 100 06/03/2018 1650   CO2 29 06/03/2018 1650   GLUCOSE 93 06/03/2018 1650   BUN 22 06/03/2018 1650   CREATININE 1.39 06/03/2018 1650   CREATININE 1.18 02/10/2016 1313   CALCIUM 9.8 06/03/2018 1650   PROT 6.5 05/14/2018 0755    ALBUMIN 4.4 05/14/2018 0755   AST 22 05/14/2018 0755   ALT 33 05/14/2018 0755   ALKPHOS 60 05/14/2018 0755   BILITOT 0.6 05/14/2018 0755   GFRNONAA >60 05/30/2018 0147   GFRAA >60 05/30/2018 0147    No results found for: TOTALPROTELP, ALBUMINELP, A1GS, A2GS, BETS, BETA2SER, GAMS, MSPIKE, SPEI  No results found for: KPAFRELGTCHN, LAMBDASER, KAPLAMBRATIO  Lab Results  Component Value Date   WBC 11.6 (H) 06/20/2018   NEUTROABS 6.8 06/20/2018   HGB 13.6 06/20/2018   HCT 40.5 06/20/2018   MCV 91.6 06/20/2018   PLT 338 06/20/2018    @LASTCHEMISTRY @  No results found for: LABCA2  No components found for: [id number]  No results for input(s): INR in the last 168 hours.  No results found for: LABCA2  No results found for: HYI502  No results found for: DXA128  No results found for: NOM767  No results found for: CA2729  No components found for: HGQUANT  No results found for: CEA1 / No results found for: CEA1   No results found for: AFPTUMOR  No results found for: CHROMOGRNA  No results found for: PSA1  Appointment on 06/20/2018  Component Date Value Ref Range Status  . WBC Count 06/20/2018 11.6* 4.0 - 10.5 K/uL Final  . RBC 06/20/2018 4.42  4.22 - 5.81 MIL/uL Final  . Hemoglobin 06/20/2018 13.6  13.0 - 17.0 g/dL Final  . HCT 06/20/2018 40.5  39.0 - 52.0 % Final  . MCV 06/20/2018 91.6  80.0 - 100.0 fL Final  . MCH 06/20/2018 30.8  26.0 - 34.0 pg Final  . MCHC 06/20/2018 33.6  30.0 - 36.0 g/dL Final  . RDW 06/20/2018 13.2  11.5 - 15.5 % Final  . Platelet Count 06/20/2018 338  150 - 400 K/uL Final  . nRBC 06/20/2018 0.0  0.0 - 0.2 % Final  . Neutrophils Relative % 06/20/2018 59  % Final  .  Neutro Abs 06/20/2018 6.8  1.7 - 7.7 K/uL Final  . Lymphocytes Relative 06/20/2018 31  % Final  . Lymphs Abs 06/20/2018 3.6  0.7 - 4.0 K/uL Final  . Monocytes Relative 06/20/2018 7  % Final  . Monocytes Absolute 06/20/2018 0.8  0.1 - 1.0 K/uL Final  . Eosinophils Relative  06/20/2018 3  % Final  . Eosinophils Absolute 06/20/2018 0.3  0.0 - 0.5 K/uL Final  . Basophils Relative 06/20/2018 0  % Final  . Basophils Absolute 06/20/2018 0.1  0.0 - 0.1 K/uL Final  . Immature Granulocytes 06/20/2018 0  % Final  . Abs Immature Granulocytes 06/20/2018 0.05  0.00 - 0.07 K/uL Final   Performed at [facility], [place] [address] [place], [place] [address]  . Smear Review 06/20/2018 SMEAR STAINED AND AVAILABLE FOR REVIEW   Final   Performed at [facility], [address] [place], [place] [address]  . Sed Rate 06/20/2018 4  0 - 16 mm/hr Final   Performed at [facility], [address] [place], [place] [address]    (this displays the last labs from the last 3 days)  No results found for: TOTALPROTELP, ALBUMINELP, A1GS, A2GS, BETS, BETA2SER, GAMS, MSPIKE, SPEI (this displays SPEP labs)  No results found for: KPAFRELGTCHN, LAMBDASER, KAPLAMBRATIO (kappa/lambda light chains)  No results found for: HGBA, HGBA2QUANT, HGBFQUANT, HGBSQUAN (Hemoglobinopathy evaluation)   No results found for: LDH  No results found for: IRON, TIBC, IRONPCTSAT (Iron and TIBC)  No results found for: FERRITIN  Urinalysis    Component Value Date/Time   COLORURINE YELLOW 07/01/2015 1210   APPEARANCEUR CLEAR 07/01/2015 1210   LABSPEC <=1.005 (A) 07/01/2015 1210   PHURINE 6.0 07/01/2015 1210   GLUCOSEU NEGATIVE 07/01/2015 1210   HGBUR SMALL (A) 07/01/2015 1210   BILIRUBINUR neg 05/14/2018 1059   KETONESUR NEGATIVE 07/01/2015 1210   PROTEINUR Negative 05/14/2018 1059   UROBILINOGEN 0.2 05/14/2018 1059   UROBILINOGEN 0.2 07/01/2015 1210   NITRITE neg 05/14/2018 1059   NITRITE NEGATIVE 07/01/2015 1210   LEUKOCYTESUR Negative 05/14/2018 1059     STUDIES:  Dg Chest 2 View  Result Date: 05/27/2018 CLINICAL DATA:  Chest pain EXAM: CHEST - 2 VIEW COMPARISON:  05/11/2015 FINDINGS: The heart size and mediastinal contours  are within normal limits. Both lungs are clear. The visualized skeletal structures are unremarkable. IMPRESSION: No active cardiopulmonary disease. Electronically Signed   By: Ulyses Jarred M.D.   On: 05/27/2018 19:44     ELIGIBLE FOR AVAILABLE RESEARCH PROTOCOL: no  REVIEW OF BLOOD FILM: The red cells show no anisocytosis, no tailed poikilocytes, no polychromasia, no schistocytes, and no targets.  The platelets are unremarkable.  The white cells show no left shift and there are no atypical or granular lymphocytes seen.  There are no smudge cells.   ASSESSMENT: 68 y.o. Gabriel Aguilar, [place] man with a mild persistent lymphocytosis in the absence of "B" symptoms, anemia, or thrombocytopenia.  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 Specifically we reviewed the biology of the patient's diagnosis and the specifics of his situation.  He understands that all blood cells are made in the marrow bones and for that reason sometimes we do bone marrow biopsies which I do not think is going to be necessary in his case.  There are  3 types of blood cells.  Red cells carry oxygen.  In his case they are normal in number and also morphologically by smear review.  He has had a consistently normal platelet count.  Accordingly 2 out of the 3 cell lines are normal, indicating it is unlikely there is a primary bone marrow issue  On the other hand his total white cell count has been slightly elevated for the last 8 years.  Looking at the differential it is generally the lymphocytes that are slightly elevated.  We discussed the possibility of chronic lymphoid leukemia and he understands that there are many types of leukemias, split first into granulocytic and lymphocytic and then into chronic and acute.  Chronic lymphoid leukemia's are not curable, but they  are very treatable, usually do not require chemotherapy, and in many cases observation alone is all that is needed.  We have sent a flow cytometry study this morning which will tell us whether his lymphocytes are clonal or not.  If they are clonal then he has very early chronic lymphoid leukemia.  All he would need is observation.  On the other if the lymphocytes are not clonal then very likely he is in the 2% of the population that normally has slightly higher white counts.  This requires no further work-up and not even observation.  It is just something he needs to be aware of so that this days work-up does not get repeated in the future  I will have the flow cytometry results sometime next week and I will give him a call at that time.   Magrinat, Gabriel Dad, MD  06/20/18 10:07 AM Medical Oncology and Hematology [facility] [address] [place], [place] [address] Tel. [phone]    Fax. [phone]   I, Jacqualyn Posey am acting as a Education administrator for Chauncey Cruel, MD.   I, Lurline Del MD, have reviewed the above documentation for accuracy and completeness, and I agree with the above.

## 2018-06-20 ENCOUNTER — Inpatient Hospital Stay: Payer: PPO

## 2018-06-20 ENCOUNTER — Inpatient Hospital Stay: Payer: PPO | Attending: Oncology | Admitting: Oncology

## 2018-06-20 ENCOUNTER — Other Ambulatory Visit: Payer: Self-pay

## 2018-06-20 VITALS — BP 151/90 | HR 105 | Temp 98.1°F | Resp 20 | Ht 68.0 in | Wt 214.0 lb

## 2018-06-20 DIAGNOSIS — Z79899 Other long term (current) drug therapy: Secondary | ICD-10-CM | POA: Diagnosis not present

## 2018-06-20 DIAGNOSIS — Z955 Presence of coronary angioplasty implant and graft: Secondary | ICD-10-CM | POA: Diagnosis not present

## 2018-06-20 DIAGNOSIS — Z905 Acquired absence of kidney: Secondary | ICD-10-CM | POA: Insufficient documentation

## 2018-06-20 DIAGNOSIS — K219 Gastro-esophageal reflux disease without esophagitis: Secondary | ICD-10-CM | POA: Insufficient documentation

## 2018-06-20 DIAGNOSIS — Z7901 Long term (current) use of anticoagulants: Secondary | ICD-10-CM | POA: Diagnosis not present

## 2018-06-20 DIAGNOSIS — I480 Paroxysmal atrial fibrillation: Secondary | ICD-10-CM

## 2018-06-20 DIAGNOSIS — Z87891 Personal history of nicotine dependence: Secondary | ICD-10-CM | POA: Diagnosis not present

## 2018-06-20 DIAGNOSIS — Z8249 Family history of ischemic heart disease and other diseases of the circulatory system: Secondary | ICD-10-CM | POA: Diagnosis not present

## 2018-06-20 DIAGNOSIS — C9210 Chronic myeloid leukemia, BCR/ABL-positive, not having achieved remission: Secondary | ICD-10-CM | POA: Diagnosis not present

## 2018-06-20 DIAGNOSIS — E785 Hyperlipidemia, unspecified: Secondary | ICD-10-CM | POA: Insufficient documentation

## 2018-06-20 DIAGNOSIS — D72829 Elevated white blood cell count, unspecified: Secondary | ICD-10-CM

## 2018-06-20 DIAGNOSIS — I214 Non-ST elevation (NSTEMI) myocardial infarction: Secondary | ICD-10-CM

## 2018-06-20 DIAGNOSIS — D72820 Lymphocytosis (symptomatic): Secondary | ICD-10-CM | POA: Diagnosis not present

## 2018-06-20 LAB — CBC WITH DIFFERENTIAL (CANCER CENTER ONLY)
Abs Immature Granulocytes: 0.05 10*3/uL (ref 0.00–0.07)
BASOS ABS: 0.1 10*3/uL (ref 0.0–0.1)
Basophils Relative: 0 %
Eosinophils Absolute: 0.3 10*3/uL (ref 0.0–0.5)
Eosinophils Relative: 3 %
HEMATOCRIT: 40.5 % (ref 39.0–52.0)
Hemoglobin: 13.6 g/dL (ref 13.0–17.0)
Immature Granulocytes: 0 %
Lymphocytes Relative: 31 %
Lymphs Abs: 3.6 10*3/uL (ref 0.7–4.0)
MCH: 30.8 pg (ref 26.0–34.0)
MCHC: 33.6 g/dL (ref 30.0–36.0)
MCV: 91.6 fL (ref 80.0–100.0)
Monocytes Absolute: 0.8 10*3/uL (ref 0.1–1.0)
Monocytes Relative: 7 %
Neutro Abs: 6.8 10*3/uL (ref 1.7–7.7)
Neutrophils Relative %: 59 %
Platelet Count: 338 10*3/uL (ref 150–400)
RBC: 4.42 MIL/uL (ref 4.22–5.81)
RDW: 13.2 % (ref 11.5–15.5)
WBC Count: 11.6 10*3/uL — ABNORMAL HIGH (ref 4.0–10.5)
nRBC: 0 % (ref 0.0–0.2)

## 2018-06-20 LAB — FERRITIN: FERRITIN: 121 ng/mL (ref 24–336)

## 2018-06-20 LAB — SAVE SMEAR(SSMR), FOR PROVIDER SLIDE REVIEW

## 2018-06-20 LAB — SEDIMENTATION RATE: Sed Rate: 4 mm/hr (ref 0–16)

## 2018-06-24 ENCOUNTER — Other Ambulatory Visit: Payer: Self-pay | Admitting: Oncology

## 2018-06-26 LAB — BCR/ABL

## 2018-06-26 NOTE — Telephone Encounter (Signed)
Attempted to call patient in regards to Cardiac Rehab - to let pt know we are closed at this time due to the COVID-19 and will contact once we have resume scheduling.  °LMTCB °

## 2018-06-27 LAB — FLOW CYTOMETRY

## 2018-07-01 ENCOUNTER — Other Ambulatory Visit: Payer: Self-pay | Admitting: Oncology

## 2018-07-01 DIAGNOSIS — D72820 Lymphocytosis (symptomatic): Secondary | ICD-10-CM

## 2018-07-01 NOTE — Progress Notes (Signed)
I called Gabriel Aguilar to give him the results of his bone marrow biopsy which do not show chronic lymphoid leukemia as we expected.  There is also no BCR ABL fusion noted.  What we do see is a reversal of the CD4/CD8 ratio.  I really do not know that this means much except in the setting of HIV positivity and we do have an HIV on him from 2017 which was negative.  I told him I would like to repeat the T-cell subsets test and an HIV sometime in the fall and he is agreeable.

## 2018-07-04 ENCOUNTER — Telehealth: Payer: Self-pay | Admitting: Oncology

## 2018-07-04 NOTE — Telephone Encounter (Signed)
Scheduled appt per 3/23 sch message - unable to reach patient - left message with appt date and time

## 2018-07-16 ENCOUNTER — Ambulatory Visit: Payer: Self-pay | Admitting: *Deleted

## 2018-07-16 NOTE — Telephone Encounter (Signed)
Called and spoke with pt and appt scheduled with doxy. Me with Dr. Sarajane Jews tomorrow.

## 2018-07-16 NOTE — Telephone Encounter (Signed)
Pt reports mild swelling both feet x 1 week or longer. States non-pitting. Denies pain, states no redness, no warmth. Does not extend up leg. States has been "Working in yard and walking more." states will elevate legs which helps, "Then they get puffy again." Also reports mild SOB with exertion only. Pts email and phone # verified. Care advise given per protocol. Please advise regarding appt. CB: [phone]  Reason for Disposition . [1] MILD swelling of both ankles (i.e., pedal edema) AND [2] is a chronic symptom (recurrent or ongoing AND present > 4 weeks)  Answer Assessment - Initial Assessment Questions 1. ONSET: "When did the swelling start?" (e.g., minutes, hours, days)     1 week ago, "Maybe longer." 2. LOCATION: "What part of the leg is swollen?"  "Are both legs swollen or just one leg?"    Both feet 3. SEVERITY: "How bad is the swelling?" (e.g., localized; mild, moderate, severe)  - Localized - small area of swelling localized to one leg  - MILD pedal edema - swelling limited to foot and ankle, pitting edema < 1/4 inch (6 mm) deep, rest and elevation eliminate most or all swelling  - MODERATE edema - swelling of lower leg to knee, pitting edema > 1/4 inch (6 mm) deep, rest and elevation only partially reduce swelling  - SEVERE edema - swelling extends above knee, facial or hand swelling present      Mild, non-pitting 4. REDNESS: "Does the swelling look red or infected?"     no 5. PAIN: "Is the swelling painful to touch?" If so, ask: "How painful is it?"   (Scale 1-10; mild, moderate or severe)     no 6. FEVER: "Do you have a fever?" If so, ask: "What is it, how was it measured, and when did it start?"     no 7. CAUSE: "What do you think is causing the leg swelling?"     Unsure, been walking more 8. MEDICAL HISTORY: "Do you have a history of heart failure, kidney disease, liver failure, or cancer?"      9. RECURRENT SYMPTOM: "Have you had leg swelling before?" If so, ask: "When  was the last time?" "What happened that time?"     no 10. OTHER SYMPTOMS: "Do you have any other symptoms?" (e.g., chest pain, difficulty breathing)      Mild SOB with exertion only  Protocols used: LEG SWELLING AND EDEMA-A-AH

## 2018-07-17 ENCOUNTER — Ambulatory Visit (INDEPENDENT_AMBULATORY_CARE_PROVIDER_SITE_OTHER): Payer: PPO | Admitting: Family Medicine

## 2018-07-17 ENCOUNTER — Encounter: Payer: Self-pay | Admitting: Family Medicine

## 2018-07-17 ENCOUNTER — Other Ambulatory Visit: Payer: Self-pay

## 2018-07-17 VITALS — BP 162/67 | Temp 97.3°F | Wt 210.0 lb

## 2018-07-17 DIAGNOSIS — M25471 Effusion, right ankle: Secondary | ICD-10-CM | POA: Insufficient documentation

## 2018-07-17 DIAGNOSIS — Z955 Presence of coronary angioplasty implant and graft: Secondary | ICD-10-CM

## 2018-07-17 DIAGNOSIS — M25472 Effusion, left ankle: Secondary | ICD-10-CM | POA: Diagnosis not present

## 2018-07-17 DIAGNOSIS — I480 Paroxysmal atrial fibrillation: Secondary | ICD-10-CM

## 2018-07-17 MED ORDER — RIVAROXABAN 20 MG PO TABS: 20.0000 mg | ORAL_TABLET | Freq: Every day | ORAL | 3 refills | Status: DC

## 2018-07-17 MED ORDER — HYDROCHLOROTHIAZIDE 25 MG PO TABS: 25.0000 mg | ORAL_TABLET | Freq: Every day | ORAL | 3 refills | Status: DC

## 2018-07-17 NOTE — Progress Notes (Signed)
Subjective:    Patient ID: Gabriel Aguilar, male    DOB: Apr 19, 1950, 68 y.o.   MRN: [id number]  HPI He is asking about some mild swelling in the ankles that appeared one week ago. This accumulates during the day and goes down at night. There is no discomfort. No redness or warmth. No chest pain or SOB. Of note when he was in the hospital in February the dose of his Amlodipine was increased to 10 mg daily. His BP at home has been running in the 160s over 70s.  Virtual Visit via Video Note  I connected with the patient on 07/17/18 at  8:00 AM EDT by a video enabled telemedicine application and verified that I am speaking with the correct person using two identifiers.  Location patient: home Location provider:work or home office Persons participating in the virtual visit: patient, provider  I discussed the limitations of evaluation and management by telemedicine and the availability of in person appointments. The patient expressed understanding and agreed to proceed.   HPI:    ROS: See pertinent positives and negatives per HPI.  Past Medical History:  Diagnosis Date  . Arthritis   . Blood pressure elevated without history of HTN   . Erectile dysfunction   . GERD (gastroesophageal reflux disease)   . Hiatal hernia   . History of pericarditis   . Hyperlipidemia   . Hypogonadism male    has seen  Dr. Orland Aguilar  . Palpitation    a. montior 2 weeks 2014--> no arrhythmia  . Paroxysmal atrial fibrillation (HCC)    sees Dr. Adrian Aguilar   . Sleep apnea, central    wears a CPAP at night     Past Surgical History:  Procedure Laterality Date  . BACK SURGERY  1994   L4-5  . COLONOSCOPY  07-09-12   per Dr. Deatra Aguilar, adenomatous polyp, repeat in 5 yrs   . CORONARY STENT INTERVENTION N/A 05/29/2018   Procedure: CORONARY STENT INTERVENTION;  Surgeon: Gabriel Hampshire, MD;  Location: [facility];  Service: Cardiovascular;  Laterality: N/A;  RCA  . EYE SURGERY     LASIK  . LEFT HEART  CATH AND CORONARY ANGIOGRAPHY N/A 05/29/2018   Procedure: LEFT HEART CATH AND CORONARY ANGIOGRAPHY;  Surgeon: Gabriel Hampshire, MD;  Location: [facility];  Service: Cardiovascular;  Laterality: N/A;  . 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 SPINE SURGERY  1994   disc at L4-L5  . VASECTOMY      Family History  Problem Relation Age of Onset  . Hyperlipidemia Father 62       KIDNEY FAILURE  . Heart disease Father   . Hypertension Father   . Kidney disease Father   . Diabetes Father   . Heart disease Mother 40  . Other Sister        LIVER FAILURE  . CVA Brother   . Other Sister        AT BIRTH  . Heart attack Sister   . CVA Brother   . Other Brother        SEVERAL CABG  . Colon cancer Neg Hx      Current Outpatient Medications:  .  amLODipine (NORVASC) 10 MG tablet, Take 1 tablet (10 mg total) by mouth daily., Disp: 90 tablet, Rfl: 3 .  Ascorbic Acid (VITAMIN C) 1000 MG tablet, Take 1,000 mg by mouth daily., Disp: , Rfl:  .  atorvastatin (LIPITOR) 80 MG tablet, TAKE 1 TABLET BY MOUTH ONCE DAILY AT  6PM. PLEASE SCHEDULE APPOINTMENT FOR FUTURE REFILLS, Disp: 90 tablet, Rfl: 0 .  clopidogrel (PLAVIX) 75 MG tablet, Take 1 tablet (75 mg total) by mouth daily with breakfast., Disp: 90 tablet, Rfl: 3 .  fenofibrate 54 MG tablet, Take 1 tablet (54 mg total) by mouth daily., Disp: 90 tablet, Rfl: 3 .  Misc Natural Products (FOCUSED MIND PO), Take 1 tablet by mouth daily. , Disp: , Rfl:  .  Multiple Vitamin (MULTI VITAMIN PO), Take 1 tablet by mouth daily., Disp: , Rfl:  .  nitroGLYCERIN (NITROSTAT) 0.4 MG SL tablet, Place 1 tablet (0.4 mg total) under the tongue every 5 (five) minutes as needed for chest pain., Disp: 30 tablet, Rfl: 0 .  OMEGA 3 1000 MG CAPS, Take 1 capsule by mouth daily. , Disp: , Rfl:  .  omeprazole (PRILOSEC) 40 MG capsule, TAKE 1 CAPSULE BY MOUTH DAILY (Patient taking differently: Take 40 mg by mouth daily. ), Disp: 90  capsule, Rfl: 3 .  oxymetazoline (AFRIN) 0.05 % nasal spray, Place 1 spray into both nostrils at bedtime., Disp: , Rfl:  .  rivaroxaban (XARELTO) 20 MG TABS tablet, Take 20 mg by mouth daily with supper., Disp: , Rfl:  .  vitamin B-12 (CYANOCOBALAMIN) 100 MCG tablet, Take 100 mcg by mouth daily as needed., Disp: , Rfl:  .  vitamin E (VITAMIN E) 400 UNIT capsule, Take 1,200 Units by mouth daily., Disp: , Rfl:   EXAM:  VITALS per patient if applicable:  GENERAL: alert, oriented, appears well and in no acute distress  HEENT: atraumatic, conjunttiva clear, no obvious abnormalities on inspection of external nose and ears  NECK: normal movements of the head and neck  LUNGS: on inspection no signs of respiratory distress, breathing rate appears normal, no obvious gross SOB, gasping or wheezing  CV: no obvious cyanosis  MS: moves all visible extremities without noticeable abnormality  PSYCH/NEURO: pleasant and cooperative, no obvious depression or anxiety, speech and thought processing grossly intact  ASSESSMENT AND PLAN: His ankle edema is an effect of the higher Amlodipine dose. We will add HCTZ 25 mg daily. He will check back with [place] in 2-3 weeks.  Gabriel Penna, MD  Discussed the following assessment and plan:  No diagnosis found.     I discussed the assessment and treatment plan with the patient. The patient was provided an opportunity to ask questions and all were answered. The patient agreed with the plan and demonstrated an understanding of the instructions.   The patient was advised to call back or seek an in-person evaluation if the symptoms worsen or if the condition fails to improve as anticipated.    Review of Systems     Objective:   Physical Exam        Assessment & Plan:

## 2018-07-18 ENCOUNTER — Encounter: Payer: Self-pay | Admitting: Family Medicine

## 2018-07-18 MED ORDER — FENOFIBRATE 54 MG PO TABS: 54.0000 mg | ORAL_TABLET | Freq: Every day | ORAL | 3 refills | Status: DC

## 2018-07-18 NOTE — Telephone Encounter (Signed)
Dr. Fry please advise. Thanks  

## 2018-07-18 NOTE — Telephone Encounter (Signed)
Yes he should stay on this. Call in #90 with 3 rf

## 2018-07-30 ENCOUNTER — Telehealth (HOSPITAL_COMMUNITY): Payer: Self-pay | Admitting: *Deleted

## 2018-07-30 NOTE — Telephone Encounter (Signed)
Called and spoke to pt for continued closure of Cardiac Rehab for Covid-19.  Pt verbalized understanding. Pt is walking 2-3 miles a day with his wife while maintaining social distancing. Pt denies any cp or sob.  Encouraged pt to keep up the good work. Will call and schedule pt when permitted. Cherre Huger, BSN Cardiac and Training and development officer

## 2018-09-12 ENCOUNTER — Telehealth: Payer: Self-pay | Admitting: Cardiology

## 2018-09-12 NOTE — Telephone Encounter (Signed)
Called to pre reg, Patient stated that he has to call insurance company to see if this virtual visit will be covered..patient will call back by 09/13/18.Gabriel KitchenMarland KitchenMarland KitchenMarland Aguilar

## 2018-09-12 NOTE — Telephone Encounter (Signed)
° °  Patient has checked with his insurance and has confirmed he will do a video visit

## 2018-09-13 NOTE — Telephone Encounter (Signed)
Video/smartphone/ consent/ my chart/ pre reg completed

## 2018-09-15 NOTE — Progress Notes (Signed)
Virtual Visit via Video Note   This visit type was conducted due to national recommendations for restrictions regarding the COVID-19 Pandemic (e.g. social distancing) in an effort to limit this patient's exposure and mitigate transmission in our community.  Due to his co-morbid illnesses, this patient is at least at moderate risk for complications without adequate follow up.  This format is felt to be most appropriate for this patient at this time.  All issues noted in this document were discussed and addressed.  A limited physical exam was performed with this format.  Please refer to the patient's chart for his consent to telehealth for [facility].   Date:  09/16/2018   ID:  Gabriel Aguilar, DOB 04/24/1950, MRN [id number]  Patient Location: Home Provider Location: Home  PCP:  Gabriel Morale, MD  Cardiologist:  Gabriel Breeding, MD  Electrophysiologist:  Gabriel Grayer, MD   Evaluation Performed:  Follow-Up Visit  Chief Complaint:  Chest pain  History of Present Illness:    Gabriel Aguilar is a 68 y.o. male who presents for follow up of PAF, pericarditis and CAD.   He had progressive DOE earlier this year.   on 05/27/2018.  Echocardiogram obtained on the following day showed EF of 60 to 65%, no significant valvular issue.  He eventually underwent cardiac catheterization on 05/29/2018 which revealed a 95% mid left circumflex lesion treated successfully with 2.5 x 20 mm Synergy DES, 40% mid LAD lesion, 99% mid RCA lesion treated with 2.75 x 20 mm DES.  Xarelto was resumed at the following day.  Postprocedure, he was placed on Plavix.  Aspirin was stopped the day he restarted on the Xarelto.  Beta-blocker was discontinued as patient had transient second-degree AV block after administration of carvedilol.  Since I last saw him he has done well.  He goes for walks every day.  He says he gets a little discomfort occasionally but this is nothing like it was before.  He gets a little breathless sometimes  but there are other times when he can be breathing just fine without bringing on any symptoms.  He has not needed any nitroglycerin.  He denies any PND or orthopnea.  He is not having any palpitations, presyncope or syncope.  He is had about a 10 pound weight loss.  He has no edema.   The patient does not have symptoms concerning for COVID-19 infection (fever, chills, cough, or new shortness of breath).    Past Medical History:  Diagnosis Date  . Arthritis   . Blood pressure elevated without history of HTN   . Erectile dysfunction   . GERD (gastroesophageal reflux disease)   . Hiatal hernia   . History of pericarditis   . Hyperlipidemia   . Hypogonadism male    has seen  Dr. Orland Mustard  . Palpitation    a. montior 2 weeks 2014--> no arrhythmia  . Paroxysmal atrial fibrillation (HCC)    sees Dr. Adrian Prows   . Sleep apnea, central    wears a CPAP at night    Past Surgical History:  Procedure Laterality Date  . BACK SURGERY  1994   L4-5  . COLONOSCOPY  07-09-12   per Dr. Deatra Ina, adenomatous polyp, repeat in 5 yrs   . CORONARY STENT INTERVENTION N/A 05/29/2018   Procedure: CORONARY STENT INTERVENTION;  Surgeon: Wellington Hampshire, MD;  Location: [place] [facility];  Service: Cardiovascular;  Laterality: N/A;  RCA  . EYE SURGERY     LASIK  .  LEFT HEART CATH AND CORONARY ANGIOGRAPHY N/A 05/29/2018   Procedure: LEFT HEART CATH AND CORONARY ANGIOGRAPHY;  Surgeon: Wellington Hampshire, MD;  Location: [place] [facility];  Service: Cardiovascular;  Laterality: N/A;  . 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 SPINE SURGERY  1994   disc at L4-L5  . VASECTOMY       Prior to Admission medications   Medication Sig Start Date End Date Taking? Authorizing Provider  amLODipine (NORVASC) 10 MG tablet Take 1 tablet (10 mg total) by mouth daily. 06/13/18  Yes Almyra Deforest, PA  Ascorbic Acid (VITAMIN C) 1000 MG tablet Take 1,000 mg by mouth daily.   Yes [provider]  atorvastatin (LIPITOR) 80 MG tablet TAKE 1 TABLET BY MOUTH ONCE DAILY AT  6PM. PLEASE SCHEDULE APPOINTMENT FOR FUTURE REFILLS 06/11/18  Yes Gabriel Breeding, MD  clopidogrel (PLAVIX) 75 MG tablet Take 1 tablet (75 mg total) by mouth daily with breakfast. 06/13/18  Yes Almyra Deforest, PA  fenofibrate 54 MG tablet Take 1 tablet (54 mg total) by mouth daily. 07/18/18  Yes Gabriel Morale, MD  hydrochlorothiazide (HYDRODIURIL) 25 MG tablet Take 1 tablet (25 mg total) by mouth daily. 07/17/18  Yes Gabriel Morale, MD  Misc Natural Products (FOCUSED MIND PO) Take 1 tablet by mouth daily.    Yes [provider]  Multiple Vitamin (MULTI VITAMIN PO) Take 1 tablet by mouth daily.   Yes [provider]  nitroGLYCERIN (NITROSTAT) 0.4 MG SL tablet Place 1 tablet (0.4 mg total) under the tongue every 5 (five) minutes as needed for chest pain. 05/30/18  Yes Florencia Reasons, MD  OMEGA 3 1000 MG CAPS Take 1 capsule by mouth daily.    Yes [provider]  omeprazole (PRILOSEC) 40 MG capsule TAKE 1 CAPSULE BY MOUTH DAILY Patient taking differently: Take 40 mg by mouth daily.  10/01/17  Yes Gabriel Breeding, MD  oxymetazoline (AFRIN) 0.05 % nasal spray Place 1 spray into both nostrils at bedtime.   Yes [provider]  rivaroxaban (XARELTO) 20 MG TABS tablet Take 1 tablet (20 mg total) by mouth daily with supper. 07/17/18  Yes Gabriel Morale, MD  vitamin B-12 (CYANOCOBALAMIN) 100 MCG tablet Take 100 mcg by mouth daily as needed.   Yes [provider]  vitamin E (VITAMIN E) 400 UNIT capsule Take 1,200 Units by mouth daily.   Yes [provider]     Allergies:   Patient has no known allergies.   Social History   Tobacco Use  . Smoking status: Former Smoker    Packs/day: 1.50    Start date: 04/10/1964    Last attempt to quit: 03/06/1983    Years since quitting: 35.5  . Smokeless tobacco: Never Used  Substance Use Topics  . Alcohol use: Yes    Alcohol/week: 1.0 standard drinks     Types: 1 Glasses of wine per week    Comment: rare  . Drug use: No     Family Hx: The patient's family history includes CVA in his brother and brother; Diabetes in his father; Heart attack in his sister; Heart disease in his father; Heart disease (age of onset: 49) in his mother; Hyperlipidemia (age of onset: 2) in his father; Hypertension in his father; Kidney disease in his father; Other in his brother, sister, and sister. There is no history of Colon cancer.  ROS:   Please see the history of  present illness.    As stated in the HPI and negative for all other systems.   Prior CV studies:   The following studies were reviewed today:  None  Labs/Other Tests and Data Reviewed:    EKG:  No ECG reviewed.  Recent Labs: 05/14/2018: ALT 33; TSH 0.89 06/03/2018: BUN 22; Creatinine, Ser 1.39; Potassium 4.3; Sodium 137 06/20/2018: Hemoglobin 13.6; Platelet Count 338   Recent Lipid Panel Lab Results  Component Value Date/Time   CHOL 171 05/28/2018 03:18 AM   TRIG 307 (H) 05/28/2018 03:18 AM   HDL 37 (L) 05/28/2018 03:18 AM   CHOLHDL 4.6 05/28/2018 03:18 AM   LDLCALC 73 05/28/2018 03:18 AM   LDLDIRECT 97.0 05/14/2018 07:55 AM    Wt Readings from Last 3 Encounters:  09/16/18 205 lb 3.2 oz (93.1 kg)  07/17/18 210 lb (95.3 kg)  06/20/18 214 lb (97.1 kg)     Objective:    Vital Signs:  BP (!) 148/67   Pulse 74   Ht 5\' 8"  (1.727 m)   Wt 205 lb 3.2 oz (93.1 kg)   BMI 31.20 kg/m    VITAL SIGNS:  reviewed GEN:  no acute distress EYES:  sclerae anicteric, EOMI - Extraocular Movements Intact NEURO:  alert and oriented x 3, no obvious focal deficit PSYCH:  normal affect  ASSESSMENT & PLAN:    CAD:  The patient has no new sypmtoms.  No further cardiovascular testing is indicated.  We will continue with aggressive risk reduction and meds as listed.  HYPERTRIGLYCERIDEMIA:    He will continue meds as listed.  LDL was 73 and HDL 37. 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  No change in therapy.  PAF: He tolerates anticoagulation.  HTN: The blood pressure is very slightly elevated ongoing having it lose another 5 pounds for control rather than titrating his meds.  He would not tolerate beta blockers.  COVID-19 Education: The signs and symptoms of COVID-19 were discussed with the patient and how to seek care for testing (follow up with PCP or arrange E-visit).  The importance of social distancing was discussed today.  Time:   Today, I have spent 28 minutes with the patient with telehealth technology discussing the above problems.     Medication Adjustments/Labs and Tests Ordered: Current medicines are reviewed at length with the patient today.  Concerns regarding medicines are outlined above.   Tests Ordered: No orders of the defined types were placed in this encounter.   Medication Changes: No orders of the defined types were placed in this encounter.   Disposition:  Follow up with me in the office in 4 months  Signed, Gabriel Breeding, MD  09/16/2018 9:57 AM    [place]

## 2018-09-16 ENCOUNTER — Telehealth (INDEPENDENT_AMBULATORY_CARE_PROVIDER_SITE_OTHER): Payer: PPO | Admitting: Cardiology

## 2018-09-16 ENCOUNTER — Encounter: Payer: Self-pay | Admitting: Cardiology

## 2018-09-16 VITALS — BP 148/67 | HR 74 | Ht 68.0 in | Wt 205.2 lb

## 2018-09-16 DIAGNOSIS — Z79899 Other long term (current) drug therapy: Secondary | ICD-10-CM | POA: Diagnosis not present

## 2018-09-16 DIAGNOSIS — Z7902 Long term (current) use of antithrombotics/antiplatelets: Secondary | ICD-10-CM | POA: Insufficient documentation

## 2018-09-16 DIAGNOSIS — Z7189 Other specified counseling: Secondary | ICD-10-CM | POA: Insufficient documentation

## 2018-09-16 DIAGNOSIS — I480 Paroxysmal atrial fibrillation: Secondary | ICD-10-CM

## 2018-09-16 DIAGNOSIS — I10 Essential (primary) hypertension: Secondary | ICD-10-CM | POA: Insufficient documentation

## 2018-09-16 DIAGNOSIS — I2510 Atherosclerotic heart disease of native coronary artery without angina pectoris: Secondary | ICD-10-CM

## 2018-09-16 DIAGNOSIS — E785 Hyperlipidemia, unspecified: Secondary | ICD-10-CM

## 2018-09-16 NOTE — Patient Instructions (Signed)
Medication Instructions:  Continue current medications  If you need a refill on your cardiac medications before your next appointment, please call your pharmacy.  Labwork: None Ordered .  Testing/Procedures: None Ordered  Follow-Up: You will need a follow up appointment in 4 months.  Please call our office 2 months in advance to schedule this appointment.  You may see James Hochrein, MD or one of the following Advanced Practice Providers on your designated Care Team:   Rhonda Barrett, PA-C . Kathryn Lawrence, DNP, ANP    At [facility], you and your health needs are our priority.  As part of our continuing mission to provide you with exceptional heart care, we have created designated Provider Care Teams.  These Care Teams include your primary Cardiologist (physician) and Advanced Practice Providers (APPs -  Physician Assistants and Nurse Practitioners) who all work together to provide you with the care you need, when you need it.  Thank you for choosing [facility] at [facility]!!     

## 2018-09-25 ENCOUNTER — Telehealth (HOSPITAL_COMMUNITY): Payer: Self-pay

## 2018-09-25 NOTE — Telephone Encounter (Signed)
Called and spoke with pt in regards to Virtual Cardiac Rehab, pt stated he would like to wait until he will be able to join our in house gym.

## 2018-10-18 ENCOUNTER — Telehealth (HOSPITAL_COMMUNITY): Payer: Self-pay

## 2018-10-18 NOTE — Telephone Encounter (Signed)
Pt insurance is active and benefits verified through [employer]. Co-pay $15.00, DED $0.00/$0.00 met, out of pocket $3,400.00/$345.00 met, co-insurance 0%. No pre-authorization required. Veronica/HTA, 10/18/2018 @ 923AM PQS#[id number]

## 2018-11-16 ENCOUNTER — Other Ambulatory Visit: Payer: Self-pay | Admitting: Cardiology

## 2018-11-27 ENCOUNTER — Other Ambulatory Visit: Payer: Self-pay | Admitting: Cardiology

## 2018-12-09 ENCOUNTER — Encounter: Payer: Self-pay | Admitting: Family Medicine

## 2018-12-09 ENCOUNTER — Telehealth (INDEPENDENT_AMBULATORY_CARE_PROVIDER_SITE_OTHER): Payer: PPO | Admitting: Family Medicine

## 2018-12-09 ENCOUNTER — Other Ambulatory Visit: Payer: Self-pay

## 2018-12-09 DIAGNOSIS — L989 Disorder of the skin and subcutaneous tissue, unspecified: Secondary | ICD-10-CM | POA: Diagnosis not present

## 2018-12-09 DIAGNOSIS — S46912A Strain of unspecified muscle, fascia and tendon at shoulder and upper arm level, left arm, initial encounter: Secondary | ICD-10-CM

## 2018-12-09 NOTE — Progress Notes (Signed)
Virtual Visit via Video Note  I connected with the patient on 12/09/18 at  3:00 PM EDT by a video enabled telemedicine application and verified that I am speaking with the correct person using two identifiers.  Location patient: home Location provider:work or home office Persons participating in the virtual visit: patient, provider  I discussed the limitations of evaluation and management by telemedicine and the availability of in person appointments. The patient expressed understanding and agreed to proceed.   HPI: Here for 2 issues. First 3 days ago he was installing a glass door at his house when the door shifted in his hands and he felt a "pop" and sharp pain in the left elbow. He has felt a pain in the crease of the elbow since then, although it feels much better today. There was no swelling or bruising. He has applied ice and has taken Ibuprofen. He has been resting and doing some ROM exercises with the arm, and this has improved. Also about 6 months ago a skin lesion appeared on the left arm which has grown slightly larger and which itches at times.    ROS: See pertinent positives and negatives per HPI.  Past Medical History:  Diagnosis Date  . Arthritis   . Blood pressure elevated without history of HTN   . Erectile dysfunction   . GERD (gastroesophageal reflux disease)   . Hiatal hernia   . History of pericarditis   . Hyperlipidemia   . Hypogonadism male    has seen  Dr. Orland Mustard  . Palpitation    a. montior 2 weeks 2014--> no arrhythmia  . Paroxysmal atrial fibrillation (HCC)    sees Dr. Adrian Prows   . Sleep apnea, central    wears a CPAP at night     Past Surgical History:  Procedure Laterality Date  . BACK SURGERY  1994   L4-5  . COLONOSCOPY  07-09-12   per Dr. Deatra Ina, adenomatous polyp, repeat in 5 yrs   . CORONARY STENT INTERVENTION N/A 05/29/2018   Procedure: CORONARY STENT INTERVENTION;  Surgeon: Wellington Hampshire, MD;  Location: [place] [facility];  Service:  Cardiovascular;  Laterality: N/A;  RCA  . EYE SURGERY     LASIK  . LEFT HEART CATH AND CORONARY ANGIOGRAPHY N/A 05/29/2018   Procedure: LEFT HEART CATH AND CORONARY ANGIOGRAPHY;  Surgeon: Wellington Hampshire, MD;  Location: [place] [facility];  Service: Cardiovascular;  Laterality: N/A;  . 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 SPINE SURGERY  1994   disc at L4-L5  . VASECTOMY      Family History  Problem Relation Age of Onset  . Hyperlipidemia Father 5       KIDNEY FAILURE  . Heart disease Father   . Hypertension Father   . Kidney disease Father   . Diabetes Father   . Heart disease Mother 55  . Other Sister        LIVER FAILURE  . CVA Brother   . Other Sister        AT BIRTH  . Heart attack Sister   . CVA Brother   . Other Brother        SEVERAL CABG  . Colon cancer Neg Hx      Current Outpatient Medications:  .  amLODipine (NORVASC) 10 MG tablet, Take 1 tablet (10 mg total) by mouth daily., Disp: 90 tablet, Rfl: 3 .  Ascorbic Acid (VITAMIN C)  1000 MG tablet, Take 1,000 mg by mouth daily., Disp: , Rfl:  .  atorvastatin (LIPITOR) 80 MG tablet, Take 1 tablet (80 mg total) by mouth daily at 6 PM., Disp: 90 tablet, Rfl: 0 .  clopidogrel (PLAVIX) 75 MG tablet, Take 1 tablet (75 mg total) by mouth daily with breakfast., Disp: 90 tablet, Rfl: 3 .  fenofibrate 54 MG tablet, Take 1 tablet (54 mg total) by mouth daily., Disp: 90 tablet, Rfl: 3 .  hydrochlorothiazide (HYDRODIURIL) 25 MG tablet, Take 1 tablet (25 mg total) by mouth daily., Disp: 90 tablet, Rfl: 3 .  Misc Natural Products (FOCUSED MIND PO), Take 1 tablet by mouth daily. , Disp: , Rfl:  .  Multiple Vitamin (MULTI VITAMIN PO), Take 1 tablet by mouth daily., Disp: , Rfl:  .  nitroGLYCERIN (NITROSTAT) 0.4 MG SL tablet, Place 1 tablet (0.4 mg total) under the tongue every 5 (five) minutes as needed for chest pain., Disp: 30 tablet, Rfl: 0 .  OMEGA 3 1000 MG CAPS, Take 1 capsule by mouth  daily. , Disp: , Rfl:  .  omeprazole (PRILOSEC) 40 MG capsule, Take 1 capsule by mouth once daily, Disp: 90 capsule, Rfl: 0 .  oxymetazoline (AFRIN) 0.05 % nasal spray, Place 1 spray into both nostrils at bedtime., Disp: , Rfl:  .  rivaroxaban (XARELTO) 20 MG TABS tablet, Take 1 tablet (20 mg total) by mouth daily with supper., Disp: 90 tablet, Rfl: 3 .  vitamin B-12 (CYANOCOBALAMIN) 100 MCG tablet, Take 100 mcg by mouth daily as needed., Disp: , Rfl:  .  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 He shows full flexion/extensiona and full internal/external rotation of the left elbow. No swelling. He points to the lateral epicondyle as the main area of pain.   PSYCH/NEURO: pleasant and cooperative, no obvious depression or anxiety, speech and thought processing grossly intact  ASSESSMENT AND PLAN: He has a strain of the left elbow and this seems to be healing. He will rest it and use ice and Advil prn. Recheck as needed. Refer to Dermatology to remove the skin lesion. Alysia Penna, MD  Discussed the following assessment and plan:  Skin lesion of left arm - Plan: Ambulatory referral to Dermatology     I discussed the assessment and treatment plan with the patient. The patient was provided an opportunity to ask questions and all were answered. The patient agreed with  the plan and demonstrated an understanding of the instructions.   The patient was advised to call back or seek an in-person evaluation if the symptoms worsen or if the condition fails to improve as anticipated.

## 2018-12-25 ENCOUNTER — Encounter: Payer: Self-pay | Admitting: Family Medicine

## 2019-01-01 ENCOUNTER — Encounter: Payer: Self-pay | Admitting: Family Medicine

## 2019-01-01 DIAGNOSIS — H2513 Age-related nuclear cataract, bilateral: Secondary | ICD-10-CM | POA: Diagnosis not present

## 2019-01-01 DIAGNOSIS — H35363 Drusen (degenerative) of macula, bilateral: Secondary | ICD-10-CM | POA: Diagnosis not present

## 2019-01-01 DIAGNOSIS — H524 Presbyopia: Secondary | ICD-10-CM | POA: Diagnosis not present

## 2019-01-01 DIAGNOSIS — H35013 Changes in retinal vascular appearance, bilateral: Secondary | ICD-10-CM | POA: Diagnosis not present

## 2019-01-01 DIAGNOSIS — H25013 Cortical age-related cataract, bilateral: Secondary | ICD-10-CM | POA: Diagnosis not present

## 2019-01-03 ENCOUNTER — Other Ambulatory Visit: Payer: Self-pay

## 2019-01-03 ENCOUNTER — Inpatient Hospital Stay: Payer: PPO | Attending: Oncology

## 2019-01-03 ENCOUNTER — Other Ambulatory Visit: Payer: Self-pay | Admitting: Oncology

## 2019-01-03 DIAGNOSIS — D72820 Lymphocytosis (symptomatic): Secondary | ICD-10-CM

## 2019-01-03 LAB — CBC WITH DIFFERENTIAL/PLATELET
Abs Immature Granulocytes: 0.04 K/uL (ref 0.00–0.07)
Basophils Absolute: 0 K/uL (ref 0.0–0.1)
Basophils Relative: 0 %
Eosinophils Absolute: 0.3 K/uL (ref 0.0–0.5)
Eosinophils Relative: 3 %
HCT: 42 % (ref 39.0–52.0)
Hemoglobin: 14.3 g/dL (ref 13.0–17.0)
Immature Granulocytes: 0 %
Lymphocytes Relative: 47 %
Lymphs Abs: 5.5 K/uL — ABNORMAL HIGH (ref 0.7–4.0)
MCH: 31.4 pg (ref 26.0–34.0)
MCHC: 34 g/dL (ref 30.0–36.0)
MCV: 92.3 fL (ref 80.0–100.0)
Monocytes Absolute: 0.8 K/uL (ref 0.1–1.0)
Monocytes Relative: 7 %
Neutro Abs: 5.1 K/uL (ref 1.7–7.7)
Neutrophils Relative %: 43 %
Platelets: 417 K/uL — ABNORMAL HIGH (ref 150–400)
RBC: 4.55 MIL/uL (ref 4.22–5.81)
RDW: 13.2 % (ref 11.5–15.5)
WBC: 11.9 K/uL — ABNORMAL HIGH (ref 4.0–10.5)
nRBC: 0 % (ref 0.0–0.2)

## 2019-01-03 LAB — CD4/CD8 (T-HELPER/T-SUPPRESSOR CELL)
CD4 absolute: 1645 /uL (ref 400–1790)
CD4%: 30 % — ABNORMAL LOW (ref 33–65)
CD8 T Cell Abs: >1600 /uL — ABNORMAL HIGH (ref 190–1000)
CD8tox: 47 % — ABNORMAL HIGH (ref 12–40)
Ratio: 0.64 — ABNORMAL LOW (ref 1.0–3.0)
Total lymphocyte count: 5488 /uL — ABNORMAL HIGH (ref 1000–4000)

## 2019-01-03 LAB — SAVE SMEAR(SSMR), FOR PROVIDER SLIDE REVIEW

## 2019-01-06 ENCOUNTER — Other Ambulatory Visit: Payer: Self-pay | Admitting: Oncology

## 2019-01-06 DIAGNOSIS — C9110 Chronic lymphocytic leukemia of B-cell type not having achieved remission: Secondary | ICD-10-CM

## 2019-01-06 DIAGNOSIS — D539 Nutritional anemia, unspecified: Secondary | ICD-10-CM

## 2019-01-06 DIAGNOSIS — D72820 Lymphocytosis (symptomatic): Secondary | ICD-10-CM

## 2019-01-09 DIAGNOSIS — L570 Actinic keratosis: Secondary | ICD-10-CM | POA: Diagnosis not present

## 2019-01-09 DIAGNOSIS — D485 Neoplasm of uncertain behavior of skin: Secondary | ICD-10-CM | POA: Diagnosis not present

## 2019-01-09 DIAGNOSIS — L918 Other hypertrophic disorders of the skin: Secondary | ICD-10-CM | POA: Diagnosis not present

## 2019-01-09 DIAGNOSIS — C44629 Squamous cell carcinoma of skin of left upper limb, including shoulder: Secondary | ICD-10-CM | POA: Diagnosis not present

## 2019-01-09 DIAGNOSIS — L814 Other melanin hyperpigmentation: Secondary | ICD-10-CM | POA: Diagnosis not present

## 2019-01-09 DIAGNOSIS — D225 Melanocytic nevi of trunk: Secondary | ICD-10-CM | POA: Diagnosis not present

## 2019-01-09 DIAGNOSIS — D2339 Other benign neoplasm of skin of other parts of face: Secondary | ICD-10-CM | POA: Diagnosis not present

## 2019-01-09 DIAGNOSIS — L821 Other seborrheic keratosis: Secondary | ICD-10-CM | POA: Diagnosis not present

## 2019-01-12 NOTE — Progress Notes (Signed)
[place]  [phone] [phone]    ID: Gabriel Aguilar DOB: Mar 03, 1951  MR#: [id number]  [id number]  Patient Care Team: Laurey Morale, MD as PCP - General (Family Medicine) [name], MD as PCP - Cardiology (Cardiology) Thompson Grayer, MD as PCP - Electrophysiology (Cardiology) Thompson Grayer, MD as Consulting Physician (Cardiology) Rigoberto Noel, MD as Consulting Physician (Pulmonary Disease) OTHER MD:   CHIEF COMPLAINT: T-cell Lymphocytosis  CURRENT TREATMENT: observaton   HISTORY OF CURRENT ILLNESS: From the original intake note:  Gabriel Aguilar was referred by Dr Sarajane Jews for evaluation and treatment of leukocytosis.   We have CBCs dating back to 02/16/2011, when his total white cell count was 10.6, with a hemoglobin of 14.3, MCV 93.4, and platelets 340,000.  Over the next several years all but 1 reading has been over 10.0, with one reading of 23,000 occurring when the patient had pericarditis, but other readings as high as 15.3 in the absence of any obvious infection.  Differentials have shown a consistently elevated lymphocyte count, being 4.8 at 02/16/2011 and 5.8 most recently, with no reading below 3.0 in between.  There has been no anemia or thrombocytopenia.  The patient's subsequent history is as detailed below.   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 Gabriel Aguilar was last seen here on 06/20/2018.   Lab results from 01/03/2019 of CBC w/diff is as follows: all values are WNL except for WBC at 11,900, platelets at 417,000, ALC at 5,500.   CD4/CD8 results show: TLC at 5,488, CD4 at 30%, CD4 Abs at 1,645 CD7tox at 47, CD8 T Cell Abs at >1,600, and a ratio of 0.64.  Gabriel Aguilar is here to discuss those results  REVIEW OF SYSTEMS: Gabriel Aguilar is doing "fine".  Gabriel Aguilar has been doing some traveling, to visit family members, and Gabriel Aguilar has a trip out [place] for his grandson's engagement party.  Gabriel Aguilar will be flying  there.  Gabriel Aguilar is being quite careful regarding the pandemic and when they fly Gabriel Aguilar and his wife essentially sanitize everywhere they set and of course wear masks and try to maintain distance.  Gabriel Aguilar has had no fevers, rash, adenopathy, drenching sweats, unexplained fatigue or unexplained weight loss.  A detailed review of systems was entirely benign    PAST MEDICAL HISTORY: Past Medical History:  Diagnosis Date  . Arthritis   . Blood pressure elevated without history of HTN   . Erectile dysfunction   . GERD (gastroesophageal reflux disease)   . Hiatal hernia   . History of pericarditis   . Hyperlipidemia   . Hypogonadism male    has seen  Dr. Orland Mustard  . Palpitation    a. montior 2 weeks 2014--> no arrhythmia  . Paroxysmal atrial fibrillation (HCC)    sees Dr. Adrian Prows   . Sleep apnea, central    wears a CPAP at night      PAST SURGICAL HISTORY: Past Surgical History:  Procedure Laterality Date  . BACK SURGERY  1994   L4-5  . COLONOSCOPY  07-09-12   per Dr. Deatra Ina, adenomatous polyp, repeat in 5 yrs   . CORONARY STENT INTERVENTION N/A 05/29/2018   Procedure: CORONARY STENT INTERVENTION;  Surgeon: Wellington Hampshire, MD;  Location: [facility];  Service: Cardiovascular;  Laterality: N/A;  RCA  . EYE SURGERY     LASIK  . LEFT HEART CATH AND CORONARY ANGIOGRAPHY N/A 05/29/2018   Procedure: LEFT HEART CATH AND CORONARY ANGIOGRAPHY;  Surgeon: Wellington Hampshire, MD;  Location: [facility];  Service: Cardiovascular;  Laterality: N/A;  . 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 SPINE SURGERY  1994   disc at L4-L5  . VASECTOMY       FAMILY HISTORY: Family History  Problem Relation Age of Onset  . Hyperlipidemia Father 86       KIDNEY FAILURE  . Heart disease Father   . Hypertension Father   . Kidney disease Father   . Diabetes Father   . Heart disease Mother 45  . Other Sister        LIVER FAILURE  . CVA Brother   . Other Sister         AT BIRTH  . Heart attack Sister   . CVA Brother   . Other Brother        SEVERAL CABG  . Colon cancer Neg Hx    Gabriel Aguilar's father died from issues with a polycystic kidney at age 45. Patients' mother died from a myocardial infarction at age 63. The patient has 5 brothers and 2 sisters. Patient denies anyone in his family having breast, ovarian, prostate, or pancreatic cancer. Gabriel Aguilar's brother, Gabriel Aguilar, was diagnosed with pancreatic cancer in his 24's.  Another brother "has a high white count".   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 His wife, [name], works for [employer]. Gabriel Aguilar has two blood children and one step-child. Gabriel Aguilar's daughter, Gabriel Aguilar, is a Marine scientist in [place], [place]. Gabriel Aguilar's son is an Optometrist in [place]. Gabriel Aguilar's Gabriel Aguilar, an an Optometrist for [employer] in [place], [place]. Gabriel Aguilar has 5 grandchildren. Gabriel Aguilar attends the [employer].   ADVANCED DIRECTIVES: Shantanu's wife, [name], is automatically his healthcare power of attorney.    HEALTH MAINTENANCE: Social History   Tobacco Use  . Smoking status: Former Smoker    Packs/day: 1.50    Start date: 04/10/1964    Quit date: 03/06/1983    Years since quitting: 35.8  . Smokeless tobacco: Never Used  Substance Use Topics  . Alcohol use: Yes    Alcohol/week: 1.0 standard drinks    Types: 1 Glasses of wine per week    Comment: rare  . Drug use: No    Colonoscopy: overdue  PSA:   Bone density:   No Known Allergies  Current Outpatient Medications  Medication Sig Dispense Refill  . amLODipine (NORVASC) 10 MG tablet Take 1 tablet (10 mg total) by mouth daily. 90 tablet 3  . Ascorbic Acid (VITAMIN C) 1000 MG tablet Take 1,000 mg by mouth daily.    [name] atorvastatin (LIPITOR) 80 MG tablet Take 1 tablet (80 mg total) by mouth daily at 6 PM. 90 tablet 0  . clopidogrel (PLAVIX) 75 MG tablet Take 1 tablet (75 mg total) by mouth daily with breakfast. 90 tablet 3  . fenofibrate 54 MG tablet  Take 1 tablet (54 mg total) by mouth daily. 90 tablet 3  . hydrochlorothiazide (HYDRODIURIL) 25 MG tablet Take 1 tablet (25 mg total) by mouth daily. 90 tablet 3  . Misc Natural Products (FOCUSED MIND PO) Take 1 tablet by mouth daily.     . Multiple Vitamin (MULTI VITAMIN PO) Take 1 tablet by mouth daily.    . nitroGLYCERIN (NITROSTAT) 0.4 MG SL tablet Place 1 tablet (0.4 mg total) under the tongue every 5 (five) minutes as needed for chest pain. 30 tablet 0  . OMEGA 3 1000  MG CAPS Take 1 capsule by mouth daily.     [name] omeprazole (PRILOSEC) 40 MG capsule Take 1 capsule by mouth once daily 90 capsule 0  . oxymetazoline (AFRIN) 0.05 % nasal spray Place 1 spray into both nostrils at bedtime.    . rivaroxaban (XARELTO) 20 MG TABS tablet Take 1 tablet (20 mg total) by mouth daily with supper. 90 tablet 3  . vitamin B-12 (CYANOCOBALAMIN) 100 MCG tablet Take 100 mcg by mouth daily as needed.    . vitamin E (VITAMIN E) 400 UNIT capsule Take 1,200 Units by mouth daily.     No current facility-administered medications for this visit.      OBJECTIVE: Middle-aged white man in no acute distress  Vitals:   01/13/19 1156  BP: (!) 157/72  Pulse: 73  Resp: 18  Temp: 98.5 F (36.9 C)  SpO2: 98%   Wt Readings from Last 3 Encounters:  09/16/18 205 lb 3.2 oz (93.1 kg)  07/17/18 210 lb (95.3 kg)  06/20/18 214 lb (97.1 kg)   Body mass index is 31.2 kg/m.    ECOG FS:0 - Asymptomatic  Ocular: Sclerae unicteric, pupils round and equal Ear-nose-throat: Wearing a mask Lymphatic: No cervical or supraclavicular adenopathy, no axillary adenopathy Lungs no rales or rhonchi Heart regular rate and rhythm Abd soft, nontender, positive bowel sounds, no obvious splenomegaly MSK no focal spinal tenderness, no joint edema Neuro: non-focal, well-oriented, appropriate affect    LAB RESULTS:  CMP     Component Value Date/Time   NA 137 06/03/2018 1650   K 4.3 06/03/2018 1650   CL 100 06/03/2018 1650    CO2 29 06/03/2018 1650   GLUCOSE 93 06/03/2018 1650   BUN 22 06/03/2018 1650   CREATININE 1.39 06/03/2018 1650   CREATININE 1.18 02/10/2016 1313   CALCIUM 9.8 06/03/2018 1650   PROT 6.5 05/14/2018 0755   ALBUMIN 4.4 05/14/2018 0755   AST 22 05/14/2018 0755   ALT 33 05/14/2018 0755   ALKPHOS 60 05/14/2018 0755   BILITOT 0.6 05/14/2018 0755   GFRNONAA >60 05/30/2018 0147   GFRAA >60 05/30/2018 0147    No results found for: TOTALPROTELP, ALBUMINELP, A1GS, A2GS, BETS, BETA2SER, GAMS, MSPIKE, SPEI  No results found for: KPAFRELGTCHN, LAMBDASER, KAPLAMBRATIO  Lab Results  Component Value Date   WBC 11.9 (H) 01/03/2019   NEUTROABS 5.1 01/03/2019   HGB 14.3 01/03/2019   HCT 42.0 01/03/2019   MCV 92.3 01/03/2019   PLT 417 (H) 01/03/2019    @LASTCHEMISTRY @  No results found for: LABCA2  No components found for: [id number]  No results for input(s): INR in the last 168 hours.  No results found for: LABCA2  No results found for: [id number]  No results found for: [id number]  No results found for: [id number]  No results found for: CA2729  No components found for: HGQUANT  No results found for: CEA1 / No results found for: CEA1   No results found for: AFPTUMOR  No results found for: CHROMOGRNA  No results found for: PSA1  No visits with results within 3 Day(s) from this visit.  Latest known visit with results is:  Appointment on 01/03/2019  Component Date Value Ref Range Status  . WBC 01/03/2019 11.9* 4.0 - 10.5 K/uL Final  . RBC 01/03/2019 4.55  4.22 - 5.81 MIL/uL Final  . Hemoglobin 01/03/2019 14.3  13.0 - 17.0 g/dL Final  . HCT 01/03/2019 42.0  39.0 - 52.0 % Final  . MCV 01/03/2019 92.3  80.0 -  100.0 fL Final  . MCH 01/03/2019 31.4  26.0 - 34.0 pg Final  . MCHC 01/03/2019 34.0  30.0 - 36.0 g/dL Final  . RDW 01/03/2019 13.2  11.5 - 15.5 % Final  . Platelets 01/03/2019 417* 150 - 400 K/uL Final  . nRBC 01/03/2019 0.0  0.0 - 0.2 % Final  . Neutrophils Relative % 01/03/2019  43  % Final  . Neutro Abs 01/03/2019 5.1  1.7 - 7.7 K/uL Final  . Lymphocytes Relative 01/03/2019 47  % Final  . Lymphs Abs 01/03/2019 5.5* 0.7 - 4.0 K/uL Final  . Monocytes Relative 01/03/2019 7  % Final  . Monocytes Absolute 01/03/2019 0.8  0.1 - 1.0 K/uL Final  . Eosinophils Relative 01/03/2019 3  % Final  . Eosinophils Absolute 01/03/2019 0.3  0.0 - 0.5 K/uL Final  . Basophils Relative 01/03/2019 0  % Final  . Basophils Absolute 01/03/2019 0.0  0.0 - 0.1 K/uL Final  . Immature Granulocytes 01/03/2019 0  % Final  . Abs Immature Granulocytes 01/03/2019 0.04  0.00 - 0.07 K/uL Final   Performed at [facility], [place] [address], [place], [place] [address]  . Smear Review 01/03/2019 SMEAR STAINED AND AVAILABLE FOR REVIEW   Final   Performed at [facility], [address] [place], [place] [address]  . Total lymphocyte count 01/03/2019 5,488* 1,000 - 4,000 /uL Final  . CD4% 01/03/2019 30* 33 - 65 % Final  . CD4 absolute 01/03/2019 1,645  400 - 1,790 /uL Final  . CD8tox 01/03/2019 47* 12 - 40 % Final  . CD8 T Cell Abs 01/03/2019 >1,600* 190 - 1,000 /uL Final  . Ratio 01/03/2019 0.64* 1.0 - 3.0 Final   Performed at [facility], [address] [place],  [address]    (this displays the last labs from the last 3 days)  No results found for: TOTALPROTELP, ALBUMINELP, A1GS, A2GS, BETS, BETA2SER, GAMS, MSPIKE, SPEI (this displays SPEP labs)  No results found for: KPAFRELGTCHN, LAMBDASER, KAPLAMBRATIO (kappa/lambda light chains)  No results found for: HGBA, HGBA2QUANT, HGBFQUANT, HGBSQUAN (Hemoglobinopathy evaluation)   No results found for: LDH  No results found for: IRON, TIBC, IRONPCTSAT (Iron and TIBC)  Lab Results  Component Value Date   FERRITIN 121 06/20/2018    Urinalysis    Component Value Date/Time   COLORURINE YELLOW 07/01/2015 1210   APPEARANCEUR CLEAR 07/01/2015 1210   LABSPEC <=1.005 (A)  07/01/2015 1210   PHURINE 6.0 07/01/2015 1210   GLUCOSEU NEGATIVE 07/01/2015 1210   HGBUR SMALL (A) 07/01/2015 1210   BILIRUBINUR neg 05/14/2018 1059   KETONESUR NEGATIVE 07/01/2015 1210   PROTEINUR Negative 05/14/2018 1059   UROBILINOGEN 0.2 05/14/2018 1059   UROBILINOGEN 0.2 07/01/2015 1210   NITRITE neg 05/14/2018 1059   NITRITE NEGATIVE 07/01/2015 1210   LEUKOCYTESUR Negative 05/14/2018 1059     STUDIES:  No results found.   ELIGIBLE FOR AVAILABLE RESEARCH PROTOCOL: no  REVIEW OF BLOOD FILM: Nondiagnostic   ASSESSMENT: 68 y.o. [place], [place] man with a mild persistent lymphocytosis in the absence of "B" symptoms, anemia, or thrombocytopenia.  (1) Flow cytometry 06/19/2018 showed no monoclonal B-cell population and no phenotypically aberrant T-cell population; there was a reversed CD4/CD8 ratio  (a) ratio still reversed (CD4/CD8 = 0.64) on retesting 01/03/2019   PLAN: I discussed Benyamin's findings in detail with him and reassured him that Gabriel Aguilar does not have cancer at this point.  Gabriel Aguilar is aware that the immune  system has 2 main branches, myeloid and lymphoid, the lymphoid branch is to main subgroups, B-cell and T-cell lymphocytes, and in the T lymphocyte group there are 2 major subgroups of concern are CD4 positive T cells and CD8 positive T cells.  These are called respectively "helper" and "suppressor" T cells, and generally there are more CD4 than CD8 T cells so the ratio is greater than 1.  The ratio in his case is reversed, with more CD8 positive cells.  A reversed CD4/CD8 ratio is not uncommon in HIV-positive patients.  We do have an HIV test from 05/28/2018 which was negative.  Reverse ratio can be seen in many other situations, including rheumatologic disease and cancers, and apparently in normal cases as well, or it may be primary, due to a clone of CD8+ T cells.  Unfortunately determining clonality and T cells is more difficult than in B cells.  We have to send a test  called T-cell receptor rearrangement.  We will plan to do that with the next lab draw, 6 months from now.  However even if we are dealing with a clonal T-cell process, it does not rise to the level of T-cell chronic lymphoid leukemia.  And even if his T cells eventually do rise to that level, Gabriel Aguilar has no anemia, thrombocytopenia, or "B symptoms" so Gabriel Aguilar would not require treatment  Hopefully all this was reassuring to [place].  Gabriel Aguilar is going to return in 6 months and a couple of weeks before that visit Gabriel Aguilar will have labs again.  I am going to check hepatitis B and C although his liver function tests are normal, and we will assess clonality in the T cells.  Gabriel Aguilar knows to call for any other issues that may develop before then.  , Virgie Dad, MD  01/13/19 12:04 PM Medical Oncology and Hematology [facility] [address] [place], [place] [address] Tel. [phone]    Fax. [phone]   I, Jacqualyn Posey am acting as a Education administrator for Chauncey Cruel, MD.

## 2019-01-13 ENCOUNTER — Other Ambulatory Visit: Payer: Self-pay

## 2019-01-13 ENCOUNTER — Inpatient Hospital Stay: Payer: PPO | Attending: Oncology | Admitting: Oncology

## 2019-01-13 VITALS — BP 157/72 | HR 73 | Temp 98.5°F | Resp 18 | Ht 68.0 in

## 2019-01-13 DIAGNOSIS — Z833 Family history of diabetes mellitus: Secondary | ICD-10-CM | POA: Insufficient documentation

## 2019-01-13 DIAGNOSIS — Z8379 Family history of other diseases of the digestive system: Secondary | ICD-10-CM | POA: Diagnosis not present

## 2019-01-13 DIAGNOSIS — Z905 Acquired absence of kidney: Secondary | ICD-10-CM | POA: Diagnosis not present

## 2019-01-13 DIAGNOSIS — I319 Disease of pericardium, unspecified: Secondary | ICD-10-CM | POA: Insufficient documentation

## 2019-01-13 DIAGNOSIS — Z79899 Other long term (current) drug therapy: Secondary | ICD-10-CM | POA: Insufficient documentation

## 2019-01-13 DIAGNOSIS — G4730 Sleep apnea, unspecified: Secondary | ICD-10-CM | POA: Insufficient documentation

## 2019-01-13 DIAGNOSIS — Z7901 Long term (current) use of anticoagulants: Secondary | ICD-10-CM | POA: Diagnosis not present

## 2019-01-13 DIAGNOSIS — I480 Paroxysmal atrial fibrillation: Secondary | ICD-10-CM | POA: Insufficient documentation

## 2019-01-13 DIAGNOSIS — Z8249 Family history of ischemic heart disease and other diseases of the circulatory system: Secondary | ICD-10-CM | POA: Diagnosis not present

## 2019-01-13 DIAGNOSIS — D72820 Lymphocytosis (symptomatic): Secondary | ICD-10-CM

## 2019-01-13 DIAGNOSIS — M1990 Unspecified osteoarthritis, unspecified site: Secondary | ICD-10-CM | POA: Insufficient documentation

## 2019-01-13 DIAGNOSIS — Z83438 Family history of other disorder of lipoprotein metabolism and other lipidemia: Secondary | ICD-10-CM | POA: Insufficient documentation

## 2019-01-13 DIAGNOSIS — Z87891 Personal history of nicotine dependence: Secondary | ICD-10-CM | POA: Insufficient documentation

## 2019-01-15 ENCOUNTER — Telehealth: Payer: Self-pay | Admitting: Cardiology

## 2019-01-15 NOTE — Telephone Encounter (Signed)
LVM for patient to call and schedule 4 month followup with Dr. [name].

## 2019-01-16 NOTE — Addendum Note (Signed)
Addended by: Chauncey Cruel on: 01/16/2019 07:54 AM   Modules accepted: Orders

## 2019-01-20 ENCOUNTER — Telehealth: Payer: Self-pay | Admitting: *Deleted

## 2019-01-20 NOTE — Telephone Encounter (Signed)
A message was left, re: his follow up visit. 

## 2019-01-23 ENCOUNTER — Telehealth: Payer: Self-pay | Admitting: Cardiology

## 2019-01-23 NOTE — Telephone Encounter (Signed)
LVM for patient to call and schedule followup with Dr. Hochrein.  °

## 2019-01-29 DIAGNOSIS — H43392 Other vitreous opacities, left eye: Secondary | ICD-10-CM | POA: Diagnosis not present

## 2019-01-30 DIAGNOSIS — L905 Scar conditions and fibrosis of skin: Secondary | ICD-10-CM | POA: Diagnosis not present

## 2019-01-30 DIAGNOSIS — C44629 Squamous cell carcinoma of skin of left upper limb, including shoulder: Secondary | ICD-10-CM | POA: Diagnosis not present

## 2019-02-20 ENCOUNTER — Other Ambulatory Visit: Payer: Self-pay | Admitting: Oncology

## 2019-02-21 ENCOUNTER — Other Ambulatory Visit: Payer: Self-pay | Admitting: Cardiology

## 2019-02-21 ENCOUNTER — Other Ambulatory Visit: Payer: Self-pay | Admitting: Oncology

## 2019-02-21 DIAGNOSIS — D72820 Lymphocytosis (symptomatic): Secondary | ICD-10-CM

## 2019-02-25 DIAGNOSIS — L820 Inflamed seborrheic keratosis: Secondary | ICD-10-CM | POA: Diagnosis not present

## 2019-02-25 DIAGNOSIS — L821 Other seborrheic keratosis: Secondary | ICD-10-CM | POA: Diagnosis not present

## 2019-02-25 DIAGNOSIS — Z85828 Personal history of other malignant neoplasm of skin: Secondary | ICD-10-CM | POA: Diagnosis not present

## 2019-02-26 ENCOUNTER — Other Ambulatory Visit: Payer: Self-pay | Admitting: Cardiology

## 2019-02-26 NOTE — Telephone Encounter (Signed)
OK to fill but future should come from Laurey Morale, MD

## 2019-06-05 ENCOUNTER — Other Ambulatory Visit: Payer: Self-pay

## 2019-06-06 ENCOUNTER — Encounter: Payer: Self-pay | Admitting: Family Medicine

## 2019-06-06 ENCOUNTER — Ambulatory Visit (INDEPENDENT_AMBULATORY_CARE_PROVIDER_SITE_OTHER): Payer: PPO | Admitting: Family Medicine

## 2019-06-06 VITALS — BP 140/60 | HR 74 | Temp 98.3°F | Wt 200.4 lb

## 2019-06-06 DIAGNOSIS — J310 Chronic rhinitis: Secondary | ICD-10-CM | POA: Diagnosis not present

## 2019-06-06 DIAGNOSIS — T485X5A Adverse effect of other anti-common-cold drugs, initial encounter: Secondary | ICD-10-CM

## 2019-06-06 DIAGNOSIS — G4733 Obstructive sleep apnea (adult) (pediatric): Secondary | ICD-10-CM

## 2019-06-06 DIAGNOSIS — J3089 Other allergic rhinitis: Secondary | ICD-10-CM | POA: Diagnosis not present

## 2019-06-06 DIAGNOSIS — R739 Hyperglycemia, unspecified: Secondary | ICD-10-CM

## 2019-06-06 DIAGNOSIS — I10 Essential (primary) hypertension: Secondary | ICD-10-CM

## 2019-06-06 LAB — CBC WITH DIFFERENTIAL/PLATELET
Basophils Absolute: 0.1 10*3/uL (ref 0.0–0.1)
Basophils Relative: 0.4 % (ref 0.0–3.0)
Eosinophils Absolute: 0.2 10*3/uL (ref 0.0–0.7)
Eosinophils Relative: 1.5 % (ref 0.0–5.0)
HCT: 39.5 % (ref 39.0–52.0)
Hemoglobin: 13.6 g/dL (ref 13.0–17.0)
Lymphocytes Relative: 35.6 % (ref 12.0–46.0)
Lymphs Abs: 4.3 10*3/uL — ABNORMAL HIGH (ref 0.7–4.0)
MCHC: 34.4 g/dL (ref 30.0–36.0)
MCV: 91.3 fl (ref 78.0–100.0)
Monocytes Absolute: 1 10*3/uL (ref 0.1–1.0)
Monocytes Relative: 8.1 % (ref 3.0–12.0)
Neutro Abs: 6.6 10*3/uL (ref 1.4–7.7)
Neutrophils Relative %: 54.4 % (ref 43.0–77.0)
Platelets: 409 10*3/uL — ABNORMAL HIGH (ref 150.0–400.0)
RBC: 4.33 Mil/uL (ref 4.22–5.81)
RDW: 13.3 % (ref 11.5–15.5)
WBC: 12.2 10*3/uL — ABNORMAL HIGH (ref 4.0–10.5)

## 2019-06-06 LAB — BASIC METABOLIC PANEL
BUN: 18 mg/dL (ref 6–23)
CO2: 33 mEq/L — ABNORMAL HIGH (ref 19–32)
Calcium: 10 mg/dL (ref 8.4–10.5)
Chloride: 98 mEq/L (ref 96–112)
Creatinine, Ser: 1.1 mg/dL (ref 0.40–1.50)
GFR: 66.08 mL/min (ref >60.00)
Glucose, Bld: 95 mg/dL (ref 70–99)
Potassium: 3.5 mEq/L (ref 3.5–5.1)
Sodium: 137 mEq/L (ref 135–145)

## 2019-06-06 LAB — HEMOGLOBIN A1C: Hgb A1c MFr Bld: 5.7 % (ref 4.6–6.5)

## 2019-06-06 LAB — TSH: TSH: 0.91 u[IU]/mL (ref 0.35–4.50)

## 2019-06-06 NOTE — Progress Notes (Signed)
   Subjective:    Patient ID: Gabriel Aguilar, male    DOB: 11-17-1950, 69 y.o.   MRN: [id number]  HPI Here for several issues. First his BP at home has been high occasionally, up to the [id number] systolic. No chest pain or SOB or headache. His ankle swelling has gone away. He notes that he uses Afrin nasal spray every night because his nose gets so stopped up that he cannot use his CPAP machine effectively. He also mentions one month of a dry intermittent cough. No fever. He does have some PND but no ST.    Review of Systems  Constitutional: Negative.   HENT: Positive for congestion and postnasal drip. Negative for rhinorrhea, sinus pressure, sinus pain and sore throat.   Eyes: Negative.   Respiratory: Positive for cough. Negative for chest tightness, shortness of breath and wheezing.   Cardiovascular: Negative.   Neurological: Negative.        Objective:   Physical Exam Constitutional:      Appearance: Normal appearance. He is not ill-appearing.  HENT:     Right Ear: Tympanic membrane, ear canal and external ear normal.     Left Ear: Tympanic membrane, ear canal and external ear normal.     Nose: Nose normal.     Mouth/Throat:     Pharynx: Oropharynx is clear.  Eyes:     Conjunctiva/sclera: Conjunctivae normal.  Cardiovascular:     Rate and Rhythm: Normal rate and regular rhythm.     Pulses: Normal pulses.     Heart sounds: Normal heart sounds.  Pulmonary:     Effort: Pulmonary effort is normal.     Breath sounds: Normal breath sounds.  Musculoskeletal:     Right lower leg: No edema.     Left lower leg: No edema.  Lymphadenopathy:     Cervical: No cervical adenopathy.  Neurological:     Mental Status: He is alert.           Assessment & Plan:  One of his main problems is his daly use of Afrin. I think this is contributing to his elevated BP readings. I explained to him the nature of rebound and how he needs to stop using Afrin. He agreed, and instead he will try Flonase  every day. He will continue to monitor the BP. The cough may be the result of some PND, so he can try Allegra as needed for that.  Alysia Penna, MD

## 2019-06-13 ENCOUNTER — Ambulatory Visit (INDEPENDENT_AMBULATORY_CARE_PROVIDER_SITE_OTHER): Payer: PPO | Admitting: Physician Assistant

## 2019-06-13 ENCOUNTER — Encounter: Payer: Self-pay | Admitting: Physician Assistant

## 2019-06-13 ENCOUNTER — Other Ambulatory Visit: Payer: Self-pay

## 2019-06-13 ENCOUNTER — Other Ambulatory Visit: Payer: Self-pay | Admitting: Cardiology

## 2019-06-13 VITALS — BP 124/72 | HR 78 | Temp 99.2°F | Ht 68.0 in | Wt 199.8 lb

## 2019-06-13 DIAGNOSIS — R0600 Dyspnea, unspecified: Secondary | ICD-10-CM | POA: Diagnosis not present

## 2019-06-13 DIAGNOSIS — G4733 Obstructive sleep apnea (adult) (pediatric): Secondary | ICD-10-CM

## 2019-06-13 DIAGNOSIS — Z9989 Dependence on other enabling machines and devices: Secondary | ICD-10-CM

## 2019-06-13 DIAGNOSIS — I10 Essential (primary) hypertension: Secondary | ICD-10-CM | POA: Diagnosis not present

## 2019-06-13 DIAGNOSIS — R0609 Other forms of dyspnea: Secondary | ICD-10-CM

## 2019-06-13 NOTE — Progress Notes (Signed)
Cardiology Office Note   Date:  06/13/2019   ID:  Gabriel Aguilar, DOB 08-Oct-1950, MRN [id number]  PCP:  Laurey Morale, MD Cardiologist:  [name], MD 09/16/2018 Televisit Electrphysiologist: Thompson Grayer, MD Rosaria Ferries, PA-C   No chief complaint on file.   History of Present Illness: Gabriel Aguilar is a 69 y.o. 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  He quit using Afrin, per Dr Barbie Banner instructions. He is now using a steroid nasal spray.  That has helped his blood pressure come down a little bit.  He is compliant w/ CPAP. If he falls asleep w/out the CPAP, he will wake up gasping for air in 10 minutes.  This is very scary for him, so he really tries to use it every time he falls asleep.  He has not had more CP, but is worried that the DOE he is having is related to his heart.  Prior to his stents, he was having dyspnea on exertion even worse than it is now.  However, his shortness of breath reminds him of what it was like prior to the stents.  He also had some chest pain prior to the stents, and has not had any chest pain.  He can still do his walking, but feels more tired that previously. He has trouble keeping up with his wife when they walk.   He has not had lower extremity edema, mild orthopnea with no recent change.  Due to compliance with CPAP, no PND.  He has not had palpitations, no presyncope or syncope.   Past Medical History:  Diagnosis Date  . Arthritis   . Erectile dysfunction   . GERD (gastroesophageal reflux disease)   . Hiatal hernia   . History of pericarditis   . HTN (hypertension)   . Hyperlipidemia LDL goal  <70   . Hypogonadism male    has seen  Dr. Orland Mustard  . Palpitation    a. montior 2 weeks 2014--> no arrhythmia  . Paroxysmal atrial fibrillation (HCC)    sees Dr. Adrian Prows   . Sleep apnea, central    wears a CPAP at night     Past Surgical History:  Procedure Laterality Date  . BACK SURGERY  1994   L4-5  . COLONOSCOPY  07-09-12   per Dr. Deatra Ina, adenomatous polyp, repeat in 5 yrs   . CORONARY STENT INTERVENTION N/A 05/29/2018   Procedure: CORONARY STENT INTERVENTION;  Surgeon: Wellington Hampshire, MD;  Location: [facility];  Service: Cardiovascular;  Laterality: N/A;  RCA  . EYE SURGERY     LASIK  . LEFT HEART CATH AND CORONARY ANGIOGRAPHY N/A 05/29/2018   Procedure: LEFT HEART CATH AND CORONARY ANGIOGRAPHY;  Surgeon: Wellington Hampshire, MD;  Location: [facility];  Service: Cardiovascular;  Laterality: N/A;  . 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 SPINE SURGERY  1994   disc at L4-L5  . VASECTOMY      Current Outpatient Medications  Medication Sig Dispense Refill  . amLODipine (  NORVASC) 10 MG tablet Take 1 tablet (10 mg total) by mouth daily. 90 tablet 3  . Ascorbic Acid (VITAMIN C) 1000 MG tablet Take 1,000 mg by mouth daily.    [name] atorvastatin (LIPITOR) 80 MG tablet Take 1 tablet (80 mg total) by mouth daily at 6 PM. 90 tablet 0  . clopidogrel (PLAVIX) 75 MG tablet Take 1 tablet (75 mg total) by mouth daily with breakfast. 90 tablet 3  . fenofibrate 54 MG tablet Take 1 tablet (54 mg total) by mouth daily. 90 tablet 3  . hydrochlorothiazide (HYDRODIURIL) 25 MG tablet Take 1 tablet (25 mg total) by mouth daily. 90 tablet 3  . omeprazole (PRILOSEC) 40 MG capsule Take 1 capsule by mouth once daily 90 capsule 3  . rivaroxaban (XARELTO) 20 MG TABS tablet Take 1 tablet (20 mg total) by mouth daily with supper. 90 tablet 3  . nitroGLYCERIN (NITROSTAT) 0.4 MG SL tablet Place 1 tablet (0.4 mg total) under the tongue every 5 (five) minutes as  needed for chest pain. 30 tablet 0  . OMEGA 3 1000 MG CAPS Take 1 capsule by mouth daily.      No current facility-administered medications for this visit.    Allergies:   Patient has no known allergies.    Social History:  The patient  reports that he quit smoking about 36 years ago. He started smoking about 55 years ago. He smoked 1.50 packs per day. He has never used smokeless tobacco. He reports current alcohol use of about 1.0 standard drinks of alcohol per week. He reports that he does not use drugs.   Family History:  The patient's family history includes CVA in his brother and brother; Diabetes in his father; Heart attack in his sister; Heart disease in his father; Heart disease (age of onset: 14) in his mother; Hyperlipidemia (age of onset: 83) in his father; Hypertension in his father; Kidney disease in his father; Other in his brother, sister, and sister.  He indicated that his mother is deceased. He indicated that his father is deceased. He indicated that all of his three sisters are deceased. He indicated that only one of his five brothers is alive. He indicated that his maternal grandmother is deceased. He indicated that his maternal grandfather is deceased. He indicated that his paternal grandmother is deceased. He indicated that his paternal grandfather is deceased. He indicated that the status of his neg hx is unknown.    ROS:  Please see the history of present illness. All other systems are reviewed and negative.    PHYSICAL EXAM: VS:  BP 124/72   Pulse 78   Temp 99.2 F (37.3 C)   Ht 5\' 8"  (1.727 m)   Wt 199 lb 12.8 oz (90.6 kg)   SpO2 96%   BMI 30.38 kg/m  , BMI Body mass index is 30.38 kg/m. GEN: Well nourished, well developed, male in no acute distress HEENT: normal for age  Neck: no JVD but hepatojugular reflux is positive, no carotid bruit, no masses Cardiac: RRR; soft murmur, no rubs, or gallops Respiratory: Decreased breath sounds bases bilaterally, normal  work of breathing GI: soft, nontender, nondistended, + BS MS: no deformity or atrophy; no edema; distal pulses are 2+ in all 4 extremities  Skin: warm and dry, no rash Neuro:  Strength and sensation are intact Psych: euthymic mood, full affect   EKG:  EKG is not ordered today.   ECHO: 05/28/2018 1. The left ventricle has normal systolic function  with an ejection  fraction of 60-65%. The cavity size was normal. Left ventricular diastolic  parameters were normal No evidence of left ventricular regional wall  motion abnormalities.  2. No evidence of left ventricular regional wall motion abnormalities.  3. The right ventricle has normal systolic function. The cavity was  normal. There is no increase in right ventricular wall thickness.  4. The mitral valve is normal in structure.  5. The tricuspid valve is normal in structure.  6. The aortic valve was not well visualized.   CATH: 05/29/2018  The left ventricular systolic function is normal.  LV end diastolic pressure is mildly elevated.  The left ventricular ejection fraction is 55-65% by visual estimate.  Mid Cx lesion is 95% stenosed.  Post intervention, there is a 0% residual stenosis.  A drug-eluting stent was successfully placed using a STENT SYNERGY DES 2.5X20.  Mid LAD lesion is 40% stenosed.  Prox LAD lesion is 20% stenosed.  Prox RCA lesion is 20% stenosed.  Mid RCA lesion is 99% stenosed.  Post intervention, there is a 0% residual stenosis.  A drug-eluting stent was successfully placed using a STENT SYNERGY DES 2.75X20.   1.  Severe two-vessel coronary artery disease involving RCA and left circumflex.  Moderate LAD disease. 2.  Normal LV systolic function mildly elevated left ventricular end-diastolic pressure. 3.  Successful angioplasty and drug-eluting stent placement to the mid right coronary artery and mid left circumflex.  Recommendations: Xarelto can be resumed tomorrow if no bleeding  complications.  Once Xarelto is resumed, aspirin can be stopped and Plavix should be continued for at least a year. Recommend aggressive treatment of risk factors. Intervention     Recent Labs: 06/06/2019: BUN 18; Creatinine, Ser 1.10; Hemoglobin 13.6; Platelets 409.0; Potassium 3.5; Sodium 137; TSH 0.91  CBC    Component Value Date/Time   WBC 12.2 (H) 06/06/2019 1028   RBC 4.33 06/06/2019 1028   HGB 13.6 06/06/2019 1028   HGB 13.6 06/20/2018 0741   HCT 39.5 06/06/2019 1028   PLT 409.0 (H) 06/06/2019 1028   PLT 338 06/20/2018 0741   MCV 91.3 06/06/2019 1028   MCH 31.4 01/03/2019 1052   MCHC 34.4 06/06/2019 1028   RDW 13.3 06/06/2019 1028   LYMPHSABS 4.3 (H) 06/06/2019 1028   MONOABS 1.0 06/06/2019 1028   EOSABS 0.2 06/06/2019 1028   BASOSABS 0.1 06/06/2019 1028   CMP Latest Ref Rng & Units 06/06/2019 06/03/2018 05/30/2018  Glucose 70 - 99 mg/dL 95 93 100(H)  BUN 6 - 23 mg/dL 18 22 14   Creatinine 0.40 - 1.50 mg/dL 1.10 1.39 1.19  Sodium 135 - 145 mEq/L 137 137 137  Potassium 3.5 - 5.1 mEq/L 3.5 4.3 4.3  Chloride 96 - 112 mEq/L 98 100 104  CO2 19 - 32 mEq/L 33(H) 29 25  Calcium 8.4 - 10.5 mg/dL 10.0 9.8 9.3  Total Protein 6.0 - 8.3 g/dL - - -  Total Bilirubin 0.2 - 1.2 mg/dL - - -  Alkaline Phos 39 - 117 U/L - - -  AST 0 - 37 U/L - - -  ALT 0 - 53 U/L - - -     Lipid Panel Lab Results  Component Value Date   CHOL 171 05/28/2018   HDL 37 (L) 05/28/2018   LDLCALC 73 05/28/2018   LDLDIRECT 97.0 05/14/2018   TRIG 307 (H) 05/28/2018   CHOLHDL 4.6 05/28/2018      Wt Readings from Last 3 Encounters:  06/13/19 199 lb 12.8  oz (90.6 kg)  06/06/19 200 lb 6.4 oz (90.9 kg)  09/16/18 205 lb 3.2 oz (93.1 kg)     Other studies Reviewed: Additional studies/ records that were reviewed today include: Office notes, hospital records and testing.  ASSESSMENT AND PLAN:  1.  Dyspnea on exertion: -He is concerned about it is an anginal equivalent -He does not feel like he has  gotten back to his pre-CAD exertional level. -However, he has not had a recent decrease in his ability to exert himself -He tends not to take a very deep breath.  He was given instructions on how to learn to breathe more deeply and this may help. -However, the dyspnea may also be an anginal equivalent.  Therefore, we will refer to cardiac rehab to make sure that he is able to exert himself safely -We will also check an echo to make sure that his PA pressures and RA pressures are normal.  2.  Hypertension: -His blood pressure has been running very good lately.  He is compliant with his medications. -He says the HCTZ has really helped keep his blood pressure down. -Dr. Sarajane Jews checked his labs recently and his renal function and electrolytes are stable on the HCTZ -His blood pressure improved by about 20 points systolic after he quit using the Afrin.  3.  OSA on CPAP: -He was using the Afrin because he was getting nasal congestion from the CPAP. -He did not make it all the way through the night last night, but on the steroid nasal spray, he is doing almost as well as he was with the Afrin.  Cough has also improved. -He is encouraged to stay off the Afrin -Dr. Sarajane Jews is managing. -He has not seen a sleep MD in several years.  He does not remember who he saw in the past, it may have been Dr. Elsworth Soho. - Will leave to Dr. Sarajane Jews   Current medicines are reviewed at length with the patient today.  The patient does not have concerns regarding medicines.  The following changes have been made:  no change  Labs/ tests ordered today include:  No orders of the defined types were placed in this encounter.    Disposition:   FU with [name], MD  Signed, Rosaria Ferries, PA-C  06/13/2019 3:34 PM    [place] Phone: [phone]; Fax: [phone]

## 2019-06-13 NOTE — Patient Instructions (Addendum)
Medication Instructions:  Your physician recommends that you continue on your current medications as directed. Please refer to the Current Medication list given to you today.  *If you need a refill on your cardiac medications before your next appointment, please call your pharmacy*   Lab Work: NONE ordered at this time of appointment   If you have labs (blood work) drawn today and your tests are completely normal, you will receive your results only by: [name] MyChart Message (if you have MyChart) OR . A paper copy in the mail If you have any lab test that is abnormal or we need to change your treatment, we will call you to review the results.  Testing/Procedures: Your physician has requested that you have an echocardiogram. Echocardiography is a painless test that uses sound waves to create images of your heart. It provides your doctor with information about the size and shape of your heart and how well your heart's chambers and valves are working. This procedure takes approximately one hour. There are no restrictions for this procedure. THIS TEST IS DONE AT [facility]    PLEASE SCHEDULE FOR 3-4 WEEKS   You have been referred to [facility] 2 WEEKS   Follow-Up: At [facility], you and your health needs are our priority.  As part of our continuing mission to provide you with exceptional heart care, we have created designated Provider Care Teams.  These Care Teams include your primary Cardiologist (physician) and Advanced Practice Providers (APPs -  Physician Assistants and Nurse Practitioners) who all work together to provide you with the care you need, when you need it.  We recommend signing up for the patient portal called "MyChart".  Sign up information is provided on this After Visit Summary.  MyChart is used to connect with patients for Virtual Visits (Telemedicine).  Patients are able to view lab/test results, encounter notes, upcoming  appointments, etc.  Non-urgent messages can be sent to your provider as well.   To learn more about what you can do with MyChart, go to [email or web address].    Your next appointment:   3 month(s)  The format for your next appointment:   In Person  Provider:   [name], MD  Other Instructions

## 2019-06-18 ENCOUNTER — Other Ambulatory Visit (HOSPITAL_COMMUNITY): Payer: Self-pay | Admitting: *Deleted

## 2019-06-20 ENCOUNTER — Telehealth (HOSPITAL_COMMUNITY): Payer: Self-pay

## 2019-06-20 ENCOUNTER — Other Ambulatory Visit: Payer: Self-pay

## 2019-06-20 ENCOUNTER — Encounter (HOSPITAL_COMMUNITY): Payer: Self-pay

## 2019-06-20 DIAGNOSIS — I208 Other forms of angina pectoris: Secondary | ICD-10-CM

## 2019-06-20 NOTE — Telephone Encounter (Signed)
Pt insurance is active and benefits verified through [employer]. Co-pay $15.00, DED $0.00/$0.00 met, out of pocket $33,400.00/$415.00 met, co-insurance 0%. No pre-authorization required. George/[employer], 06/20/19 @ 11:14AM, UIQ#[id number]  Will contact patient to see if he is interested in the Cardiac Rehab Program.

## 2019-06-20 NOTE — Telephone Encounter (Signed)
Attempted to call patient in regards to Cardiac Rehab - LM on VM Mailed letter 

## 2019-06-30 ENCOUNTER — Other Ambulatory Visit: Payer: Self-pay

## 2019-06-30 ENCOUNTER — Inpatient Hospital Stay: Payer: PPO | Attending: Oncology

## 2019-06-30 ENCOUNTER — Other Ambulatory Visit: Payer: Self-pay | Admitting: *Deleted

## 2019-06-30 DIAGNOSIS — D72820 Lymphocytosis (symptomatic): Secondary | ICD-10-CM

## 2019-06-30 DIAGNOSIS — D729 Disorder of white blood cells, unspecified: Secondary | ICD-10-CM | POA: Diagnosis not present

## 2019-06-30 LAB — CBC WITH DIFFERENTIAL/PLATELET
Abs Immature Granulocytes: 0.06 10*3/uL (ref 0.00–0.07)
Basophils Absolute: 0 10*3/uL (ref 0.0–0.1)
Basophils Relative: 0 %
Eosinophils Absolute: 0.2 10*3/uL (ref 0.0–0.5)
Eosinophils Relative: 2 %
HCT: 39.5 % (ref 39.0–52.0)
Hemoglobin: 13.5 g/dL (ref 13.0–17.0)
Immature Granulocytes: 1 %
Lymphocytes Relative: 41 %
Lymphs Abs: 4.5 10*3/uL — ABNORMAL HIGH (ref 0.7–4.0)
MCH: 31.3 pg (ref 26.0–34.0)
MCHC: 34.2 g/dL (ref 30.0–36.0)
MCV: 91.4 fL (ref 80.0–100.0)
Monocytes Absolute: 0.9 10*3/uL (ref 0.1–1.0)
Monocytes Relative: 8 %
Neutro Abs: 5.2 10*3/uL (ref 1.7–7.7)
Neutrophils Relative %: 48 %
Platelets: 390 10*3/uL (ref 150–400)
RBC: 4.32 MIL/uL (ref 4.22–5.81)
RDW: 13 % (ref 11.5–15.5)
WBC: 10.9 10*3/uL — ABNORMAL HIGH (ref 4.0–10.5)
nRBC: 0 % (ref 0.0–0.2)

## 2019-06-30 LAB — HEPATITIS C ANTIBODY: HCV Ab: NONREACTIVE

## 2019-06-30 LAB — HEPATITIS B CORE ANTIBODY, IGM: Hep B C IgM: NONREACTIVE

## 2019-06-30 LAB — HEPATITIS B SURFACE ANTIGEN: Hepatitis B Surface Ag: NONREACTIVE

## 2019-07-01 LAB — SURGICAL PATHOLOGY

## 2019-07-02 LAB — FLOW CYTOMETRY

## 2019-07-03 ENCOUNTER — Other Ambulatory Visit: Payer: Self-pay | Admitting: Oncology

## 2019-07-07 ENCOUNTER — Other Ambulatory Visit: Payer: Self-pay

## 2019-07-07 ENCOUNTER — Ambulatory Visit (HOSPITAL_COMMUNITY): Payer: PPO | Attending: Cardiology

## 2019-07-07 DIAGNOSIS — R0600 Dyspnea, unspecified: Secondary | ICD-10-CM | POA: Diagnosis not present

## 2019-07-07 DIAGNOSIS — R0609 Other forms of dyspnea: Secondary | ICD-10-CM

## 2019-07-09 LAB — T CELL LYMPOHMA, PCR (GENPATH)

## 2019-07-13 NOTE — Progress Notes (Signed)
[place]  [phone] [phone]    ID: Markell Avram DOB: September 21, 1950  MR#: [id number]  [id number]  Patient Care Team: Laurey Morale, MD as PCP - General (Family Medicine) [name], MD as PCP - Cardiology (Cardiology) Thompson Grayer, MD as PCP - Electrophysiology (Cardiology) Rigoberto Noel, MD as Consulting Physician (Pulmonary Disease) Jaiyden Laur, Virgie Dad, MD as Consulting Physician (Oncology) OTHER MD:   CHIEF COMPLAINT: T-cell chronic lymphoid leukemia  CURRENT TREATMENT: observaton   INTERVAL HISTORY: Rollins returns today for follow-up of his T-cell lymphocytosis.  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  We also obtain flow cytometry which showed no monoclonal B-cell population but again an inverted CD4/CD8 ratio.  His absolute lymphocyte count has been stable: Results for KIMOTHY, LEONHART (MRN [id number]) as of 07/14/2019 09:02  Ref. Range 02/16/2011 09:44 05/14/2012 10:08 04/04/2013 15:56 05/26/2013 09:09 03/19/2015 08:05 05/05/2015 08:57 05/13/2015 02:30 07/01/2015 12:10 07/21/2015 15:53 05/14/2018 07:55 06/03/2018 16:50 06/20/2018 07:41 01/03/2019 10:52 06/06/2019 10:28 06/30/2019 12:00  Lymphocyte # Latest Ref Range: 0.7 - 4.0 K/uL 4.8 (H) 5.4 (H) 3.3 5.1 (H) 5.6 (H) 5.7 (H) 4.9 (H) 4.3 (H) 5.6 (H) 4.6 (H) 5.8 (H) 3.6 5.5 (H) 4.3 (H) 4.5 (H)   Of note, he is followed by dermatology for a history of non-melanoma skin cancer.   REVIEW OF SYSTEMS: Makoa is tolerating his blood thinners well without bleeding or bruising issues.  He walks 2 to 5 miles at a time although he can keep up with his wife who has quicker pace.  He feels a bit short of breath and is currently going through cardiac rehab which she had not done before.  He has not yet received his Covid vaccinations.  He is considering this.  He has had no  drenching sweats unexplained fatigue unexplained weight loss, pruritus, or or fevers.  Detailed review of systems today was otherwise stable.   HISTORY OF CURRENT ILLNESS: From the original intake note:  Leny Onder was referred by Dr Sarajane Jews for evaluation and treatment of leukocytosis.   We have CBCs dating back to 02/16/2011, when his total white cell count was 10.6, with a hemoglobin of 14.3, MCV 93.4, and platelets 340,000.  Over the next several years all but 1 reading has been over 10.0, with one reading of 23,000 occurring when the patient had pericarditis, but other readings as high as 15.3 in the absence of any obvious infection.  Differentials have shown a consistently elevated lymphocyte count, being 4.8 at 02/16/2011 and 5.8 most recently, with no reading below 3.0 in between.  There has been no anemia or thrombocytopenia.  The patient's subsequent history is as detailed below.   PAST MEDICAL HISTORY: Past Medical History:  Diagnosis Date  . Arthritis   . Erectile dysfunction   . GERD (gastroesophageal reflux disease)   . Hiatal hernia   . History of pericarditis   . HTN (hypertension)   . Hyperlipidemia LDL goal <70   . Hypogonadism male    has seen  Dr. Orland Mustard  . Palpitation    a. montior 2 weeks 2014--> no arrhythmia  . Paroxysmal atrial fibrillation (HCC)    sees Dr. Adrian Prows   . Sleep apnea, central    wears a CPAP at night     PAST SURGICAL HISTORY: Past Surgical History:  Procedure Laterality Date  . BACK SURGERY  1994   L4-5  . COLONOSCOPY  07-09-12   per Dr. Deatra Ina, adenomatous polyp, repeat in 5 yrs   . CORONARY STENT INTERVENTION N/A 05/29/2018   Procedure: CORONARY STENT INTERVENTION;  Surgeon: Wellington Hampshire, MD;  Location: [facility];  Service: Cardiovascular;  Laterality: N/A;  RCA  . EYE SURGERY     LASIK  . LEFT HEART CATH AND CORONARY ANGIOGRAPHY N/A 05/29/2018   Procedure: LEFT HEART CATH AND CORONARY ANGIOGRAPHY;  Surgeon: Wellington Hampshire, MD;  Location: [facility];  Service: Cardiovascular;  Laterality: N/A;  . 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 SPINE SURGERY  1994   disc at L4-L5  . VASECTOMY      FAMILY HISTORY: Family History  Problem Relation Age of Onset  . Hyperlipidemia Father 32       KIDNEY FAILURE  . Heart disease Father   . Hypertension Father   . Kidney disease Father   . Diabetes Father   . Heart disease Mother 69  . Other Sister        LIVER FAILURE  . CVA Brother   . Other Sister        AT BIRTH  . Heart attack Sister   . CVA Brother   . Other Brother        SEVERAL CABG  . Colon cancer Neg Hx    Nashawn's father died from issues with a polycystic kidney at age 33. Patients' mother died from a myocardial infarction at age 6. The patient has 5 brothers and 2 sisters. Patient denies anyone in his family having breast, ovarian, prostate, or pancreatic cancer. Blaiden's brother, [name], was diagnosed with pancreatic cancer in his 72's.  Another brother "has a high white count".   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 His wife, [name], works for [employer]. Padraig has two blood children and one step-child. Lateef's daughter, [name], is a Marine scientist in [place], [place]. Mc's son is an Optometrist in [place]. Amari's Bretta Bang, an an Optometrist for [employer] in [place], [place]. Lauritz has 5 grandchildren. He attends the [employer].   ADVANCED DIRECTIVES: Dorrien's wife, [name], is automatically his healthcare power of attorney.    HEALTH MAINTENANCE: Social History   Tobacco Use  . Smoking status: Former Smoker    Packs/day: 1.50    Start date: 04/10/1964    Quit date: 03/06/1983    Years since quitting: 36.3  . Smokeless tobacco: Never Used  Substance Use Topics  . Alcohol use: Yes    Alcohol/week: 1.0 standard drinks    Types: 1 Glasses of wine per week    Comment: rare  . Drug use: No     Colonoscopy: overdue  PSA:   Bone density:   No Known Allergies  Current Outpatient Medications  Medication Sig Dispense Refill  . amLODipine (NORVASC) 10 MG tablet Take 1 tablet (10 mg total) by mouth daily. 90 tablet 3  . Ascorbic Acid (VITAMIN C) 1000 MG tablet Take 1,000 mg by mouth daily.    [name] atorvastatin (LIPITOR) 80 MG tablet TAKE 1 TABLET BY MOUTH ONCE DAILY AT  6  PM 90 tablet 3  . clopidogrel (PLAVIX) 75 MG tablet Take 1 tablet (75 mg total) by mouth daily with breakfast. 90 tablet 3  . fenofibrate 54 MG tablet Take 1 tablet (54 mg total) by mouth daily. 90 tablet 3  . hydrochlorothiazide (  HYDRODIURIL) 25 MG tablet Take 1 tablet (25 mg total) by mouth daily. 90 tablet 3  . nitroGLYCERIN (NITROSTAT) 0.4 MG SL tablet Place 1 tablet (0.4 mg total) under the tongue every 5 (five) minutes as needed for chest pain. 30 tablet 0  . OMEGA 3 1000 MG CAPS Take 1 capsule by mouth daily.     [name] omeprazole (PRILOSEC) 40 MG capsule Take 1 capsule by mouth once daily 90 capsule 3  . rivaroxaban (XARELTO) 20 MG TABS tablet Take 1 tablet (20 mg total) by mouth daily with supper. 90 tablet 3   No current facility-administered medications for this visit.    OBJECTIVE: white man who appears stated age 85:   07/14/19 0951  BP: (!) 160/72  Pulse: 78  Resp: 17  Temp: 98 F (36.7 C)  SpO2: 96%   Wt Readings from Last 3 Encounters:  07/14/19 201 lb 9.6 oz (91.4 kg)  06/13/19 199 lb 12.8 oz (90.6 kg)  06/06/19 200 lb 6.4 oz (90.9 kg)   Body mass index is 30.65 kg/m.    ECOG FS:1 - Symptomatic but completely ambulatory  Sclerae unicteric, EOMs intact Wearing a mask No cervical or supraclavicular adenopathy, no axillary adenopathy Lungs no rales or rhonchi Heart regular rate and rhythm Abd soft, nontender, positive bowel sounds MSK no focal spinal tenderness Neuro: nonfocal, well oriented, appropriate affect    LAB RESULTS:  CMP     Component Value Date/Time   NA 137  06/06/2019 1028   K 3.5 06/06/2019 1028   CL 98 06/06/2019 1028   CO2 33 (H) 06/06/2019 1028   GLUCOSE 95 06/06/2019 1028   BUN 18 06/06/2019 1028   CREATININE 1.10 06/06/2019 1028   CREATININE 1.18 02/10/2016 1313   CALCIUM 10.0 06/06/2019 1028   PROT 6.5 05/14/2018 0755   ALBUMIN 4.4 05/14/2018 0755   AST 22 05/14/2018 0755   ALT 33 05/14/2018 0755   ALKPHOS 60 05/14/2018 0755   BILITOT 0.6 05/14/2018 0755   GFRNONAA >60 05/30/2018 0147   GFRAA >60 05/30/2018 0147    No results found for: TOTALPROTELP, ALBUMINELP, A1GS, A2GS, BETS, BETA2SER, GAMS, MSPIKE, SPEI  No results found for: KPAFRELGTCHN, LAMBDASER, KAPLAMBRATIO  Lab Results  Component Value Date   WBC 10.9 (H) 06/30/2019   NEUTROABS 5.2 06/30/2019   HGB 13.5 06/30/2019   HCT 39.5 06/30/2019   MCV 91.4 06/30/2019   PLT 390 06/30/2019   No results found for: LABCA2  No components found for: [id number]  No results for input(s): INR in the last 168 hours.  No results found for: LABCA2  No results found for: [id number]  No results found for: [id number]  No results found for: [id number]  No results found for: CA2729  No components found for: HGQUANT  No results found for: CEA1 / No results found for: CEA1   No results found for: AFPTUMOR  No results found for: CHROMOGRNA  No results found for: PSA1  No results found for: HGBA, HGBA2QUANT, HGBFQUANT, HGBSQUAN (Hemoglobinopathy evaluation)   No results found for: LDH  No results found for: IRON, TIBC, IRONPCTSAT (Iron and TIBC)  Lab Results  Component Value Date   FERRITIN 121 06/20/2018    Urinalysis    Component Value Date/Time   COLORURINE YELLOW 07/01/2015 1210   APPEARANCEUR CLEAR 07/01/2015 1210   LABSPEC <=1.005 (A) 07/01/2015 1210   PHURINE 6.0 07/01/2015 1210   GLUCOSEU NEGATIVE 07/01/2015 1210   HGBUR SMALL (A) 07/01/2015 1210   BILIRUBINUR neg  05/14/2018 1059   KETONESUR NEGATIVE 07/01/2015 1210   PROTEINUR Negative 05/14/2018 1059    UROBILINOGEN 0.2 05/14/2018 1059   UROBILINOGEN 0.2 07/01/2015 1210   NITRITE neg 05/14/2018 1059   NITRITE NEGATIVE 07/01/2015 1210   LEUKOCYTESUR Negative 05/14/2018 1059     STUDIES:  ECHOCARDIOGRAM COMPLETE  Result Date: 07/07/2019    ECHOCARDIOGRAM REPORT   Patient Name:   BOLIVAR FIGURA Date of Exam: 07/07/2019 Medical Rec #:  [id number]     Height:       68.0 in Accession #:    [id number]    Weight:       199.8 lb Date of Birth:  [birth year]     BSA:          2.043 m Patient Age:    69 years      BP:           151/70 mmHg Patient Gender: M             HR:           60 bpm. Exam Location:  [facility] Procedure: 2D Echo, Cardiac Doppler and Color Doppler Indications:    R06.00 Dyspnea  History:        Patient has prior history of Echocardiogram examinations, most                 recent 05/28/2018. Arrythmias:Atrial Fibrillation; Risk                 Factors:Hypertension and HLD, Pericarditis.  Sonographer:    Marygrace Drought RCS Referring Phys: Grant  1. Normal LV systolic and diastolic function.  2. Left ventricular ejection fraction, by estimation, is 60 to 65%. The left ventricle has normal function. The left ventricle has no regional wall motion abnormalities. Left ventricular diastolic parameters were normal.  3. Right ventricular systolic function is normal. The right ventricular size is normal. There is normal pulmonary artery systolic pressure.  4. The mitral valve is normal in structure. Trivial mitral valve regurgitation. No evidence of mitral stenosis.  5. The aortic valve is tricuspid. Aortic valve regurgitation is not visualized. Mild aortic valve sclerosis is present, with no evidence of aortic valve stenosis.  6. The inferior vena cava is normal in size with greater than 50% respiratory variability, suggesting right atrial pressure of 3 mmHg. FINDINGS  Left Ventricle: Left ventricular ejection fraction, by estimation, is 60 to 65%. The left ventricle has normal  function. The left ventricle has no regional wall motion abnormalities. The left ventricular internal cavity size was normal in size. There is  no left ventricular hypertrophy. Left ventricular diastolic parameters were normal. Right Ventricle: The right ventricular size is normal. Right ventricular systolic function is normal. There is normal pulmonary artery systolic pressure. The tricuspid regurgitant velocity is 2.24 m/s, and with an assumed right atrial pressure of 3 mmHg,  the estimated right ventricular systolic pressure is [id number] mmHg. Left Atrium: Left atrial size was normal in size. Right Atrium: Right atrial size was normal in size. Pericardium: There is no evidence of pericardial effusion. Mitral Valve: The mitral valve is normal in structure. Normal mobility of the mitral valve leaflets. Trivial mitral valve regurgitation. No evidence of mitral valve stenosis. Tricuspid Valve: The tricuspid valve is normal in structure. Tricuspid valve regurgitation is trivial. No evidence of tricuspid stenosis. Aortic Valve: The aortic valve is tricuspid. Aortic valve regurgitation is not visualized. Mild aortic valve sclerosis is present, with no evidence of  aortic valve stenosis. Pulmonic Valve: The pulmonic valve was not well visualized. Pulmonic valve regurgitation is trivial. No evidence of pulmonic stenosis. Aorta: The aortic root is normal in size and structure. Venous: The inferior vena cava is normal in size with greater than 50% respiratory variability, suggesting right atrial pressure of 3 mmHg. IAS/Shunts: No atrial level shunt detected by color flow Doppler. Additional Comments: Normal LV systolic and diastolic function.  LEFT VENTRICLE PLAX 2D LVIDd:         4.28 cm  Diastology LVIDs:         2.35 cm  LV e' lateral:   12.00 cm/s LV PW:         1.10 cm  LV E/e' lateral: 7.5 LV IVS:        0.86 cm  LV e' medial:    8.38 cm/s LVOT diam:     2.10 cm  LV E/e' medial:  10.8 LV SV:         93 LV SV Index:   45  LVOT Area:     3.46 cm  RIGHT VENTRICLE RV Basal diam:  3.97 cm RV S prime:     14.10 cm/s TAPSE (M-mode): 3.0 cm RVSP:           23.1 mmHg LEFT ATRIUM             Index       RIGHT ATRIUM           Index LA diam:        4.00 cm 1.96 cm/m  RA Pressure: 3.00 mmHg LA Vol (A2C):   42.9 ml 21.00 ml/m RA Area:     14.70 cm LA Vol (A4C):   39.6 ml 19.38 ml/m RA Volume:   37.70 ml  18.45 ml/m LA Biplane Vol: 42.4 ml 20.76 ml/m  AORTIC VALVE LVOT Vmax:   111.00 cm/s LVOT Vmean:  76.100 cm/s LVOT VTI:    0.268 m  AORTA Ao Root diam: 3.20 cm MITRAL VALVE               TRICUSPID VALVE MV Area (PHT):             TR Peak grad:   20.1 mmHg MV Decel Time:             TR Vmax:        224.00 cm/s MV E velocity: 90.20 cm/s  Estimated RAP:  3.00 mmHg MV A velocity: 71.80 cm/s  RVSP:           23.1 mmHg MV E/A ratio:  1.26                            SHUNTS                            Systemic VTI:  0.27 m                            Systemic Diam: 2.10 cm Kirk Ruths MD Electronically signed by Kirk Ruths MD Signature Date/Time: 07/07/2019/4:42:35 PM    Final      ELIGIBLE FOR AVAILABLE RESEARCH PROTOCOL: no   ASSESSMENT: 69 y.o. [place], [place] man with a mild persistent lymphocytosis in the absence of "B" symptoms, anemia, or thrombocytopenia.  (1) Flow cytometry 06/19/2018 showed no monoclonal B-cell population and no phenotypically aberrant T-cell  population; there was a reversed CD4/CD8 ratio  (a) ratio still reversed (CD4/CD8 = 0.64) on retesting 01/03/2019  (b) repeat flow cytometry 06/30/2019 again shows reverse CD4/CD8 ratio and no monoclonal B-cell population  (2) T-cell gamma gene rearrangement by PCR 06/30/2019 confirms a clonal T-cell gene rearrangement, consistent with T-cell chronic lymphoid leukemia  (3) treatment: no indications for treatment as of March 2021  (a) hepatitis B surface antigen and core antibody and hepatitis C antibody negative 06/30/2019   PLAN: We finally established that  Kyree has a T-cell chronic lymphoid leukemia.  We again reviewed the leukemia spectrum and he understands the difference between acute on chronic, between myeloid and lymphoid, and between B-cell and T-cell.  Most chronic lymphoid leukemia*B-cell.  His is less common 1.  The treatment indications however are the same.  If he develops anemia or thrombocytopenia directly related to bone marrow infiltration, if he has loss of 10% of the body weight for no clear reason, if he has severe fatigue (barely able to get out of bed in the morning), drenching sweats, unexplained fevers, or unexplained pruritus, we would consider treatment and he understands the treatment is generally benign and effective.  We would not want to treat unless these indications are present because we would not want to knock off is good T cells.  This is the ones that protect him from the whole panoply of disease is seen in HIV patients.  He understands it is reversed CD4/CD8 ratio is not clinically significant although he is minimally immunocompromised compared to the normal population given his T-cell CLL.  I have encouraged him to receive his Covid vaccines as soon as he can schedule them.  He sees his primary care doctor and cardiology at a minimum and I expect they will check CBCs.  From my point of view if I see him once a year and check CBC that would be adequate.  Of course if he develops any of the problems listed above he will let me know and I would see him earlier  Total encounter time 30 minutes.*  Rosalyn Archambault, Virgie Dad, MD  07/14/19 10:15 AM Medical Oncology and Hematology [facility] [address] [place], [place] [address] Tel. [phone]    Fax. [phone]    I, Wilburn Mylar, am acting as scribe for Dr. Virgie Dad. Skyelar Halliday.  I, Lurline Del MD, have reviewed the above documentation for accuracy and completeness, and I agree with the above.    *Total Encounter Time as defined by the  Centers for Medicare and Medicaid Services includes, in addition to the face-to-face time of a patient visit (documented in the note above) non-face-to-face time: obtaining and reviewing outside history, ordering and reviewing medications, tests or procedures, care coordination (communications with other health care professionals or caregivers) and documentation in the medical record.

## 2019-07-14 ENCOUNTER — Inpatient Hospital Stay: Payer: PPO | Attending: Oncology | Admitting: Oncology

## 2019-07-14 ENCOUNTER — Other Ambulatory Visit: Payer: Self-pay

## 2019-07-14 ENCOUNTER — Encounter: Payer: Self-pay | Admitting: Oncology

## 2019-07-14 VITALS — BP 160/72 | HR 78 | Temp 98.0°F | Resp 17 | Ht 68.0 in | Wt 201.6 lb

## 2019-07-14 DIAGNOSIS — I480 Paroxysmal atrial fibrillation: Secondary | ICD-10-CM

## 2019-07-14 DIAGNOSIS — D849 Immunodeficiency, unspecified: Secondary | ICD-10-CM | POA: Insufficient documentation

## 2019-07-14 DIAGNOSIS — Z87891 Personal history of nicotine dependence: Secondary | ICD-10-CM | POA: Insufficient documentation

## 2019-07-14 DIAGNOSIS — M1990 Unspecified osteoarthritis, unspecified site: Secondary | ICD-10-CM | POA: Diagnosis not present

## 2019-07-14 DIAGNOSIS — Z79899 Other long term (current) drug therapy: Secondary | ICD-10-CM | POA: Diagnosis not present

## 2019-07-14 DIAGNOSIS — C91Z0 Other lymphoid leukemia not having achieved remission: Secondary | ICD-10-CM

## 2019-07-14 DIAGNOSIS — Z8379 Family history of other diseases of the digestive system: Secondary | ICD-10-CM | POA: Insufficient documentation

## 2019-07-14 DIAGNOSIS — Z905 Acquired absence of kidney: Secondary | ICD-10-CM | POA: Diagnosis not present

## 2019-07-14 DIAGNOSIS — Z841 Family history of disorders of kidney and ureter: Secondary | ICD-10-CM | POA: Diagnosis not present

## 2019-07-14 DIAGNOSIS — I441 Atrioventricular block, second degree: Secondary | ICD-10-CM | POA: Diagnosis not present

## 2019-07-14 DIAGNOSIS — I10 Essential (primary) hypertension: Secondary | ICD-10-CM | POA: Insufficient documentation

## 2019-07-14 DIAGNOSIS — Z8249 Family history of ischemic heart disease and other diseases of the circulatory system: Secondary | ICD-10-CM | POA: Insufficient documentation

## 2019-07-14 DIAGNOSIS — Z8349 Family history of other endocrine, nutritional and metabolic diseases: Secondary | ICD-10-CM | POA: Diagnosis not present

## 2019-07-14 DIAGNOSIS — Z833 Family history of diabetes mellitus: Secondary | ICD-10-CM | POA: Diagnosis not present

## 2019-07-14 DIAGNOSIS — R0602 Shortness of breath: Secondary | ICD-10-CM | POA: Diagnosis not present

## 2019-07-14 DIAGNOSIS — D72820 Lymphocytosis (symptomatic): Secondary | ICD-10-CM | POA: Diagnosis not present

## 2019-07-14 DIAGNOSIS — Z7901 Long term (current) use of anticoagulants: Secondary | ICD-10-CM | POA: Insufficient documentation

## 2019-07-15 ENCOUNTER — Telehealth: Payer: Self-pay | Admitting: Oncology

## 2019-07-15 NOTE — Telephone Encounter (Signed)
Scheduled appts per 4/5 los. Left voicemail with new appt details.

## 2019-07-17 ENCOUNTER — Telehealth: Payer: Self-pay | Admitting: Family Medicine

## 2019-07-17 DIAGNOSIS — I2510 Atherosclerotic heart disease of native coronary artery without angina pectoris: Secondary | ICD-10-CM

## 2019-07-17 DIAGNOSIS — I480 Paroxysmal atrial fibrillation: Secondary | ICD-10-CM

## 2019-07-17 NOTE — Chronic Care Management (AMB) (Signed)
  Chronic Care Management   Note  07/17/2019 Name: Gabriel Aguilar MRN: [id number] DOB: Dec 26, 1950  Gabriel Aguilar is a 69 y.o. year old male who is a primary care patient of Laurey Morale, MD. I reached out to Nickola Major by phone today in response to a referral sent by [name] PCP, Laurey Morale, MD.   [name] was given information about Chronic Care Management services today including:  1. CCM service includes personalized support from designated clinical staff supervised by his physician, including individualized plan of care and coordination with other care providers 2. 24/7 contact phone numbers for assistance for urgent and routine care needs. 3. Service will only be billed when office clinical staff spend 20 minutes or more in a month to coordinate care. 4. Only one practitioner may furnish and bill the service in a calendar month. 5. The patient may stop CCM services at any time (effective at the end of the month) by phone call to the office staff.   Patient agreed to services and verbal consent obtained.   Follow up plan:   Raynicia Dukes [employer] Scheduler

## 2019-07-21 ENCOUNTER — Other Ambulatory Visit: Payer: Self-pay | Admitting: Physician Assistant

## 2019-07-22 ENCOUNTER — Telehealth (HOSPITAL_COMMUNITY): Payer: Self-pay | Admitting: Pharmacist

## 2019-07-22 NOTE — Telephone Encounter (Signed)
Cardiac Rehab Medication Review by a Pharmacist  Does the patient feel that his/her medications are working for him/her?  yes  Has the patient been experiencing any side effects to the medications prescribed?  no  Does the patient measure his/her own blood pressure or blood glucose at home?  Yes; BP 136/66 (06/04/2019)  Does the patient have any problems obtaining medications due to transportation or finances?  no  Understanding of regimen: excellent Understanding of indications: excellent Potential of compliance: excellent    Pharmacist comments: N/A   Kennon Holter, PharmD PGY1 Ambulatory Care Pharmacy Resident 07/22/2019 2:52 PM

## 2019-07-23 ENCOUNTER — Telehealth (HOSPITAL_COMMUNITY): Payer: Self-pay | Admitting: *Deleted

## 2019-07-24 ENCOUNTER — Encounter (HOSPITAL_COMMUNITY): Payer: Self-pay

## 2019-07-24 ENCOUNTER — Other Ambulatory Visit: Payer: Self-pay

## 2019-07-24 ENCOUNTER — Encounter (HOSPITAL_COMMUNITY)
Admission: RE | Admit: 2019-07-24 | Discharge: 2019-07-24 | Disposition: A | Discharge status: Home / Self Care | Payer: PPO | Source: Ambulatory Visit | Attending: Cardiology | Admitting: Cardiology

## 2019-07-24 VITALS — BP 118/60 | HR 68 | Temp 97.5°F | Ht 69.0 in | Wt 202.8 lb

## 2019-07-24 DIAGNOSIS — I208 Other forms of angina pectoris: Secondary | ICD-10-CM

## 2019-07-24 HISTORY — DX: Atherosclerotic heart disease of native coronary artery without angina pectoris: I25.10

## 2019-07-24 NOTE — Progress Notes (Addendum)
Cardiac Individual Treatment Plan  Patient Details  Name: Gabriel Aguilar MRN: [id number] Date of Birth: [birth year] Referring Provider:     [place] from 07/24/2019 in [place]  Referring Provider  [name] MD      Initial Encounter Date:    CARDIAC REHAB PHASE II ORIENTATION from 07/24/2019 in [place]  Date  07/24/19      Visit Diagnosis: Stable angina ([place])  Patient's Home Medications on Admission:  Current Outpatient Medications:  .  amLODipine (NORVASC) 10 MG tablet, Take 1 tablet by mouth once daily, Disp: 90 tablet, Rfl: 3 .  Ascorbic Acid (VITAMIN C) 1000 MG tablet, Take 1,000 mg by mouth daily., Disp: , Rfl:  .  atorvastatin (LIPITOR) 80 MG tablet, TAKE 1 TABLET BY MOUTH ONCE DAILY AT  6  PM, Disp: 90 tablet, Rfl: 3 .  clopidogrel (PLAVIX) 75 MG tablet, TAKE 1 TABLET BY MOUTH DAILY WITH  BREAKFAST, Disp: 90 tablet, Rfl: 3 .  fenofibrate 54 MG tablet, Take 1 tablet (54 mg total) by mouth daily., Disp: 90 tablet, Rfl: 3 .  fluticasone (FLONASE) 50 MCG/ACT nasal spray, Place 1 spray into both nostrils at bedtime., Disp: , Rfl:  .  hydrochlorothiazide (HYDRODIURIL) 25 MG tablet, Take 1 tablet (25 mg total) by mouth daily., Disp: 90 tablet, Rfl: 3 .  nitroGLYCERIN (NITROSTAT) 0.4 MG SL tablet, Place 1 tablet (0.4 mg total) under the tongue every 5 (five) minutes as needed for chest pain., Disp: 30 tablet, Rfl: 0 .  OMEGA 3 1000 MG CAPS, Take 1 capsule by mouth daily. , Disp: , Rfl:  .  omeprazole (PRILOSEC) 40 MG capsule, Take 1 capsule by mouth once daily, Disp: 90 capsule, Rfl: 3 .  rivaroxaban (XARELTO) 20 MG TABS tablet, Take 1 tablet (20 mg total) by mouth daily with supper., Disp: 90 tablet, Rfl: 3  Past Medical History: Past Medical History:  Diagnosis Date  . Arthritis   . Coronary artery disease   . Erectile dysfunction   . GERD (gastroesophageal reflux disease)   .  Hiatal hernia   . History of pericarditis   . HTN (hypertension)   . Hyperlipidemia LDL goal <70   . Hypogonadism male    has seen  Dr. Orland Mustard  . Palpitation    a. montior 2 weeks 2014--> no arrhythmia  . Paroxysmal atrial fibrillation (HCC)    sees Dr. Adrian Prows   . Sleep apnea, central    wears a CPAP at night     Tobacco Use: Social History   Tobacco Use  Smoking Status Former Smoker  . Packs/day: 1.50  . Start date: 04/10/1964  . Quit date: 03/06/1983  . Years since quitting: 36.4  Smokeless Tobacco Never Used    Labs: Recent Chemical engineer    Labs for ITP Cardiac and Pulmonary Rehab Latest Ref Rng & Units 07/01/2015 02/10/2016 05/14/2018 05/28/2018 06/06/2019   Cholestrol 0 - 200 mg/dL - 215(H) 172 171 -   LDLCALC 0 - 99 mg/dL - 121 - 73 -   LDLDIRECT mg/dL - - 97.0 - -   HDL >40 mg/dL - 45 41.20 37(L) -   Trlycerides <150 mg/dL - 244(H) 249.0(H) 307(H) -   Hemoglobin A1c 4.6 - 6.5 % 5.9 - - 5.8(H) 5.7      Capillary Blood Glucose: Lab Results  Component Value Date   GLUCAP 98 05/12/2015     Exercise Target Goals:  Exercise Program Goal: Individual exercise prescription set using results from initial 6 min walk test and THRR while considering  patient's activity barriers and safety.   Exercise Prescription Goal: Starting with aerobic activity 30 plus minutes a day, 3 days per week for initial exercise prescription. Provide home exercise prescription and guidelines that participant acknowledges understanding prior to discharge.  Activity Barriers & Risk Stratification: Activity Barriers & Cardiac Risk Stratification - 07/24/19 1545      Activity Barriers & Cardiac Risk Stratification   Activity Barriers  Joint Problems;Shortness of Breath    Cardiac Risk Stratification  Moderate       6 Minute Walk: 6 Minute Walk    Row Name 07/24/19 1542         6 Minute Walk   Phase  Initial     Distance  1664 feet     Walk Time  6 minutes     # of Rest  Breaks  0     MPH  3.2     METS  3.3     RPE  11     Perceived Dyspnea   1     VO2 Peak  11.8     Symptoms  Yes (comment)     Comments  Mild SOB +1     Resting HR  68 bpm     Resting BP  118/60     Resting Oxygen Saturation   97 %     Exercise Oxygen Saturation  during 6 min walk  97 %     Max Ex. HR  92 bpm     Max Ex. BP  138/78     2 Minute Post BP  120/60        Oxygen Initial Assessment:   Oxygen Re-Evaluation:   Oxygen Discharge (Final Oxygen Re-Evaluation):   Initial Exercise Prescription: Initial Exercise Prescription - 07/24/19 1600      Date of Initial Exercise RX and Referring Provider   Date  07/24/19    Referring Provider  [name] MD    Expected Discharge Date  09/19/19      Treadmill   MPH  2.3    Grade  1    Minutes  15    METs  3.08      NuStep   Level  2    SPM  85    Minutes  15    METs  2.5      Prescription Details   Frequency (times per week)  3x    Duration  Progress to 30 minutes of continuous aerobic without signs/symptoms of physical distress      Intensity   THRR 40-80% of Max Heartrate  60-120    Ratings of Perceived Exertion  11-13    Perceived Dyspnea  0-4      Progression   Progression  Continue progressive overload as per policy without signs/symptoms or physical distress.      Resistance Training   Training Prescription  Yes    Weight  4lbs    Reps  10-15       Perform Capillary Blood Glucose checks as needed.  Exercise Prescription Changes:   Exercise Comments:   Exercise Goals and Review:  Exercise Goals    Row Name 07/24/19 1544             Exercise Goals   Increase Physical Activity  Yes       Intervention  Provide advice, education, support and counseling about  physical activity/exercise needs.;Develop an individualized exercise prescription for aerobic and resistive training based on initial evaluation findings, risk stratification, comorbidities and participant's personal goals.        Expected Outcomes  Short Term: Attend rehab on a regular basis to increase amount of physical activity.;Long Term: Add in home exercise to make exercise part of routine and to increase amount of physical activity.;Long Term: Exercising regularly at least 3-5 days a week.       Increase Strength and Stamina  Yes       Intervention  Provide advice, education, support and counseling about physical activity/exercise needs.;Develop an individualized exercise prescription for aerobic and resistive training based on initial evaluation findings, risk stratification, comorbidities and participant's personal goals.       Expected Outcomes  Short Term: Increase workloads from initial exercise prescription for resistance, speed, and METs.;Short Term: Perform resistance training exercises routinely during rehab and add in resistance training at home;Long Term: Improve cardiorespiratory fitness, muscular endurance and strength as measured by increased METs and functional capacity (6MWT)       Able to understand and use rate of perceived exertion (RPE) scale  Yes       Intervention  Provide education and explanation on how to use RPE scale       Expected Outcomes  Long Term:  Able to use RPE to guide intensity level when exercising independently;Short Term: Able to use RPE daily in rehab to express subjective intensity level       Knowledge and understanding of Target Heart Rate Range (THRR)  Yes       Intervention  Provide education and explanation of THRR including how the numbers were predicted and where they are located for reference       Expected Outcomes  Short Term: Able to state/look up THRR;Long Term: Able to use THRR to govern intensity when exercising independently;Short Term: Able to use daily as guideline for intensity in rehab       Able to check pulse independently  Yes       Intervention  Provide education and demonstration on how to check pulse in carotid and radial arteries.;Review the importance of  being able to check your own pulse for safety during independent exercise       Expected Outcomes  Short Term: Able to explain why pulse checking is important during independent exercise;Long Term: Able to check pulse independently and accurately       Understanding of Exercise Prescription  Yes       Intervention  Provide education, explanation, and written materials on patient's individual exercise prescription       Expected Outcomes  Short Term: Able to explain program exercise prescription;Long Term: Able to explain home exercise prescription to exercise independently          Exercise Goals Re-Evaluation :    Discharge Exercise Prescription (Final Exercise Prescription Changes):   Nutrition:  Target Goals: Understanding of nutrition guidelines, daily intake of sodium 1500mg , cholesterol 200mg , calories 30% from fat and 7% or less from saturated fats, daily to have 5 or more servings of fruits and vegetables.  Biometrics: Pre Biometrics - 07/24/19 1544      Pre Biometrics   Height  5\' 9"  (1.753 m)    Weight  92 kg    Waist Circumference  40.5 inches    Hip Circumference  42 inches    Waist to Hip Ratio  0.96 %    BMI (Calculated)  29.94  Triceps Skinfold  18 mm    % Body Fat  29.3 %    Grip Strength  52 kg    Flexibility  15 in    Single Leg Stand  10.81 seconds        Nutrition Therapy Plan and Nutrition Goals:   Nutrition Assessments:   Nutrition Goals Re-Evaluation:   Nutrition Goals Discharge (Final Nutrition Goals Re-Evaluation):   Psychosocial: Target Goals: Acknowledge presence or absence of significant depression and/or stress, maximize coping skills, provide positive support system. Participant is able to verbalize types and ability to use techniques and skills needed for reducing stress and depression.  Initial Review & Psychosocial Screening: Initial Psych Review & Screening - 07/24/19 1631      Initial Review   Current issues with  None  Identified      Family Dynamics   Good Support System?  Yes   Adahir has his wife for support     Barriers   Psychosocial barriers to participate in program  There are no identifiable barriers or psychosocial needs.      Screening Interventions   Interventions  Encouraged to exercise       Quality of Life Scores: Quality of Life - 07/24/19 1609      Quality of Life   Select  Quality of Life      Quality of Life Scores   Health/Function Pre  24.13 %    Socioeconomic Pre  29.14 %    Psych/Spiritual Pre  27.43 %    Family Pre  28.8 %    GLOBAL Pre  26.53 %      Scores of 19 and below usually indicate a poorer quality of life in these areas.  A difference of  2-3 points is a clinically meaningful difference.  A difference of 2-3 points in the total score of the Quality of Life Index has been associated with significant improvement in overall quality of life, self-image, physical symptoms, and general health in studies assessing change in quality of life.  PHQ-9: Recent Review Flowsheet Data    Depression screen [facility] 2/9 07/24/2019 05/13/2018 05/13/2018 01/27/2015   Decreased Interest 0 0 0 0   Down, Depressed, Hopeless 0 0 0 0   PHQ - 2 Score 0 0 0 0   Altered sleeping - 3  - -   Tired, decreased energy - 1 - -   Change in appetite - 0 - -   Feeling bad or failure about yourself  - 0 - -   Trouble concentrating - 0 - -   Moving slowly or fidgety/restless - 0 - -   Suicidal thoughts - 0 - -   PHQ-9 Score - 4 - -     Interpretation of Total Score  Total Score Depression Severity:  1-4 = Minimal depression, 5-9 = Mild depression, 10-14 = Moderate depression, 15-19 = Moderately severe depression, 20-27 = Severe depression   Psychosocial Evaluation and Intervention:   Psychosocial Re-Evaluation:   Psychosocial Discharge (Final Psychosocial Re-Evaluation):   Vocational Rehabilitation: Provide vocational rehab assistance to qualifying candidates.   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  Participant will state understanding/return demonstration of topics presented.  Learning Barriers/Preferences: Learning Barriers/Preferences - 07/24/19 1548      Learning Barriers/Preferences   Learning Barriers  Sight    Learning Preferences  Written Material;Verbal Instruction;Skilled Demonstration       Education Topics: Hypertension, Hypertension Reduction -Define heart disease and high blood pressure. Discus how high blood pressure affects the body and ways to reduce high blood pressure.   Exercise and Your Heart -Discuss why it is important to exercise, the FITT principles of exercise, normal and abnormal responses to exercise, and how to exercise safely.   Angina -Discuss definition of angina, causes of angina, treatment of angina, and how to decrease risk of having angina.   Cardiac Medications -Review what the following cardiac medications are used for, how they affect the body, and side effects that may occur when taking the medications.  Medications include Aspirin, Beta blockers, calcium channel blockers, ACE Inhibitors, angiotensin receptor blockers, diuretics, digoxin, and antihyperlipidemics.   Congestive Heart Failure -Discuss the definition of CHF, how to live with CHF, the signs and symptoms of CHF, and how keep track of weight and sodium intake.   Heart Disease and Intimacy -Discus the effect sexual activity has on the heart, how changes occur during intimacy as we age, and safety during sexual activity.   Smoking Cessation / COPD -Discuss different methods to quit smoking, the health benefits  of quitting smoking, and the definition of COPD.   Nutrition I: Fats -Discuss the types of cholesterol, what cholesterol does to the heart, and how cholesterol levels can be controlled.   Nutrition II: Labels -Discuss the different components of food labels and how to read food label   Heart Parts/Heart Disease and PAD -Discuss the anatomy of the heart, the pathway of blood circulation through the heart, and these are affected by heart disease.   Stress I: Signs and Symptoms -Discuss the causes of stress, how stress may lead to anxiety and depression, and ways to limit stress.   Stress II: Relaxation -Discuss different types of relaxation techniques to limit stress.   Warning Signs of Stroke / TIA -Discuss definition of a stroke, what the signs and symptoms are of a stroke, and how to identify when someone is having stroke.   Knowledge Questionnaire Score: Knowledge Questionnaire Score - 07/24/19 1547      Knowledge Questionnaire Score   Pre Score  21/24       Core Components/Risk Factors/Patient Goals at Admission: Personal Goals and Risk Factors at Admission - 07/24/19 1632      Core Components/Risk Factors/Patient Goals on Admission    Weight Management  Yes;Weight Maintenance;Weight Loss    Intervention  Weight Management: Develop a combined nutrition and exercise program designed to reach desired caloric intake, while maintaining appropriate intake of nutrient and fiber, sodium and fats, and appropriate energy expenditure required for the weight goal.;Weight Management: Provide education and appropriate resources to help participant work on and attain dietary goals.    Expected Outcomes  Short Term: Continue to assess and modify interventions until short term weight is achieved;Long Term: Adherence to nutrition and physical activity/exercise program aimed toward attainment of established weight goal;Weight Maintenance: Understanding of the daily nutrition guidelines,  which includes 25-35% calories from fat, 7% or less cal from saturated fats, less than 200mg  cholesterol, less than 1.5gm of sodium, & 5 or more servings of fruits and vegetables daily;Weight Loss: Understanding of general recommendations for a balanced deficit meal plan, which promotes 1-2  lb weight loss per week and includes a negative energy balance of [phone] kcal/d;Understanding recommendations for meals to include 15-35% energy as protein, 25-35% energy from fat, 35-60% energy from carbohydrates, less than 200mg  of dietary cholesterol, 20-35 gm of total fiber daily;Understanding of distribution of calorie intake throughout the day with the consumption of 4-5 meals/snacks    Hypertension  Yes    Intervention  Monitor prescription use compliance.;Provide education on lifestyle modifcations including regular physical activity/exercise, weight management, moderate sodium restriction and increased consumption of fresh fruit, vegetables, and low fat dairy, alcohol moderation, and smoking cessation.    Expected Outcomes  Short Term: Continued assessment and intervention until BP is < 140/38mm HG in hypertensive participants. < 130/50mm HG in hypertensive participants with diabetes, heart failure or chronic kidney disease.;Long Term: Maintenance of blood pressure at goal levels.    Lipids  Yes    Intervention  Provide education and support for participant on nutrition & aerobic/resistive exercise along with prescribed medications to achieve LDL 70mg , HDL >40mg .    Expected Outcomes  Short Term: Participant states understanding of desired cholesterol values and is compliant with medications prescribed. Participant is following exercise prescription and nutrition guidelines.;Long Term: Cholesterol controlled with medications as prescribed, with individualized exercise RX and with personalized nutrition plan. Value goals: LDL < 70mg , HDL > 40 mg.       Core Components/Risk Factors/Patient Goals Review:     Core Components/Risk Factors/Patient Goals at Discharge (Final Review):    ITP Comments: ITP Comments    Row Name 07/24/19 1622           ITP Comments  Dr Fransico Him MD, Medical Director          [place] attended orientation on 07/24/2019 to review rules and guidelines for program.  Completed 6 minute walk test, Intitial ITP, and exercise prescription.  VSS. Telemetry-Sinus Rhythm. Shankar did experience some mild shortness of breath otherwise  Asymptomatic. Safety measures and social distancing in place per CDC guidelines.Barnet Pall, RN,BSN 07/24/2019 4:39 PM

## 2019-07-28 ENCOUNTER — Other Ambulatory Visit: Payer: Self-pay

## 2019-07-28 ENCOUNTER — Encounter (HOSPITAL_COMMUNITY)
Admission: RE | Admit: 2019-07-28 | Discharge: 2019-07-28 | Disposition: A | Discharge status: Home / Self Care | Payer: PPO | Source: Ambulatory Visit | Attending: Cardiology | Admitting: Cardiology

## 2019-07-28 DIAGNOSIS — I208 Other forms of angina pectoris: Secondary | ICD-10-CM | POA: Diagnosis not present

## 2019-07-28 NOTE — Progress Notes (Signed)
Daily Session Note  Patient Details  Name: Gabriel Aguilar MRN: [id number] Date of Birth: Oct 26, 1950 Referring Provider:     [place] from 07/24/2019 in [place]  Referring Provider  [name] MD      Encounter Date: 07/28/2019  Check In: Session Check In - 07/28/19 1510      Check-In   Supervising physician immediately available to respond to emergencies  [facility] immediately available    Physician(s)  Dr. Tawanna Solo    Location  [facility]    Staff Present  Dorma Russell, MS,ACSM CEP, Exercise Physiologist;Hawke Villalpando, RN, Deland Pretty, MS, ACSM CEP, Exercise Physiologist;Portia Rollene Rotunda, RN, BSN    Virtual Visit  No    Medication changes reported      No    Fall or balance concerns reported     No    Tobacco Cessation  No Change    Warm-up and Cool-down  Performed on first and last piece of equipment    Resistance Training Performed  Yes    VAD Patient?  No    PAD/SET Patient?  No      Pain Assessment   Currently in Pain?  No/denies    Pain Score  0-No pain    Multiple Pain Sites  No       Capillary Blood Glucose: No results found for this or any previous visit (from the past 24 hour(s)).  Exercise Prescription Changes - 07/28/19 1600      Response to Exercise   Blood Pressure (Admit)  126/58    Blood Pressure (Exercise)  154/80    Blood Pressure (Exit)  114/64    Heart Rate (Admit)  62 bpm    Heart Rate (Exercise)  89 bpm    Heart Rate (Exit)  66 bpm    Rating of Perceived Exertion (Exercise)  12    Perceived Dyspnea (Exercise)  0    Symptoms  None    Comments  Pt's first day of exercise    Duration  Progress to 30 minutes of  aerobic without signs/symptoms of physical distress    Intensity  THRR unchanged      Progression   Progression  Continue to progress workloads to maintain intensity without signs/symptoms of physical distress.    Average METs  3.3      Resistance Training   Training Prescription  Yes    Weight  5lbs    Reps  10-15    Time  10 Minutes      Treadmill   MPH  2.8    Grade  1    Minutes  15    METs  3.53      NuStep   Level  3    SPM  95    Minutes  15    METs  3       Social History   Tobacco Use  Smoking Status Former Smoker  . Packs/day: 1.50  . Start date: 04/10/1964  . Quit date: 03/06/1983  . Years since quitting: 36.4  Smokeless Tobacco Never Used    Goals Met:  No report of cardiac concerns or symptoms  Goals Unmet:  Not Applicable  Comments: Pt started cardiac rehab today.  Pt tolerated light exercise without difficulty. VSS, telemetry-Sinus Rhythm, asymptomatic.  Medication list reconciled. Pt denies barriers to medicaiton compliance.  PSYCHOSOCIAL ASSESSMENT:  PHQ-0. Pt exhibits positive coping skills, hopeful outlook with supportive family. No psychosocial needs  identified at this time, no psychosocial interventions necessary.    Pt enjoys travelling and wood working.   Pt oriented to exercise equipment and routine.    Understanding verbalized.Barnet Pall, RN,BSN 07/28/2019 4:34 PM   Dr. Fransico Him is Medical Director for Cardiac Rehab at [facility].

## 2019-07-30 ENCOUNTER — Other Ambulatory Visit: Payer: Self-pay

## 2019-07-30 ENCOUNTER — Encounter (HOSPITAL_COMMUNITY)
Admission: RE | Admit: 2019-07-30 | Discharge: 2019-07-30 | Disposition: A | Discharge status: Home / Self Care | Payer: PPO | Source: Ambulatory Visit | Attending: Cardiology | Admitting: Cardiology

## 2019-07-30 DIAGNOSIS — I208 Other forms of angina pectoris: Secondary | ICD-10-CM

## 2019-08-01 ENCOUNTER — Encounter (HOSPITAL_COMMUNITY)
Admission: RE | Admit: 2019-08-01 | Discharge: 2019-08-01 | Disposition: A | Discharge status: Home / Self Care | Payer: PPO | Source: Ambulatory Visit | Attending: Cardiology | Admitting: Cardiology

## 2019-08-01 ENCOUNTER — Other Ambulatory Visit: Payer: Self-pay

## 2019-08-01 DIAGNOSIS — I208 Other forms of angina pectoris: Secondary | ICD-10-CM | POA: Diagnosis not present

## 2019-08-04 ENCOUNTER — Other Ambulatory Visit: Payer: Self-pay

## 2019-08-04 ENCOUNTER — Encounter (HOSPITAL_COMMUNITY)
Admission: RE | Admit: 2019-08-04 | Discharge: 2019-08-04 | Disposition: A | Discharge status: Home / Self Care | Payer: PPO | Source: Ambulatory Visit | Attending: Cardiology | Admitting: Cardiology

## 2019-08-04 ENCOUNTER — Encounter: Payer: Self-pay | Admitting: Family Medicine

## 2019-08-04 VITALS — Ht 69.0 in | Wt 202.0 lb

## 2019-08-04 DIAGNOSIS — I208 Other forms of angina pectoris: Secondary | ICD-10-CM

## 2019-08-04 NOTE — Progress Notes (Signed)
Gabriel Aguilar 69 y.o. male Nutrition Note  Visit Diagnosis: Stable angina [facility])  Past Medical History:  Diagnosis Date  . Arthritis   . Coronary artery disease   . Erectile dysfunction   . GERD (gastroesophageal reflux disease)   . Hiatal hernia   . History of pericarditis   . HTN (hypertension)   . Hyperlipidemia LDL goal <70   . Hypogonadism male    has seen  Dr. Orland Mustard  . Palpitation    a. montior 2 weeks 2014--> no arrhythmia  . Paroxysmal atrial fibrillation (HCC)    sees Dr. Adrian Prows   . Sleep apnea, central    wears a CPAP at night      Medications reviewed.   Current Outpatient Medications:  .  amLODipine (NORVASC) 10 MG tablet, Take 1 tablet by mouth once daily, Disp: 90 tablet, Rfl: 3 .  Ascorbic Acid (VITAMIN C) 1000 MG tablet, Take 1,000 mg by mouth daily., Disp: , Rfl:  .  atorvastatin (LIPITOR) 80 MG tablet, TAKE 1 TABLET BY MOUTH ONCE DAILY AT  6  PM, Disp: 90 tablet, Rfl: 3 .  clopidogrel (PLAVIX) 75 MG tablet, TAKE 1 TABLET BY MOUTH DAILY WITH  BREAKFAST, Disp: 90 tablet, Rfl: 3 .  fenofibrate 54 MG tablet, Take 1 tablet (54 mg total) by mouth daily., Disp: 90 tablet, Rfl: 3 .  fluticasone (FLONASE) 50 MCG/ACT nasal spray, Place 1 spray into both nostrils at bedtime., Disp: , Rfl:  .  hydrochlorothiazide (HYDRODIURIL) 25 MG tablet, Take 1 tablet (25 mg total) by mouth daily., Disp: 90 tablet, Rfl: 3 .  nitroGLYCERIN (NITROSTAT) 0.4 MG SL tablet, Place 1 tablet (0.4 mg total) under the tongue every 5 (five) minutes as needed for chest pain., Disp: 30 tablet, Rfl: 0 .  OMEGA 3 1000 MG CAPS, Take 1 capsule by mouth daily. , Disp: , Rfl:  .  omeprazole (PRILOSEC) 40 MG capsule, Take 1 capsule by mouth once daily, Disp: 90 capsule, Rfl: 3 .  rivaroxaban (XARELTO) 20 MG TABS tablet, Take 1 tablet (20 mg total) by mouth daily with supper., Disp: 90 tablet, Rfl: 3   Ht Readings from Last 1 Encounters:  07/24/19 5\' 9"  (1.753 m)     Wt Readings from Last 3  Encounters:  07/24/19 202 lb 13.2 oz (92 kg)  07/14/19 201 lb 9.6 oz (91.4 kg)  06/13/19 199 lb 12.8 oz (90.6 kg)     There is no height or weight on file to calculate BMI.   Social History   Tobacco Use  Smoking Status Former Smoker  . Packs/day: 1.50  . Start date: 04/10/1964  . Quit date: 03/06/1983  . Years since quitting: 36.4  Smokeless Tobacco Never Used     Lab Results  Component Value Date   CHOL 171 05/28/2018   Lab Results  Component Value Date   HDL 37 (L) 05/28/2018   Lab Results  Component Value Date   LDLCALC 73 05/28/2018   Lab Results  Component Value Date   TRIG 307 (H) 05/28/2018     Lab Results  Component Value Date   HGBA1C 5.7 06/06/2019     CBG (last 3)  No results for input(s): GLUCAP in the last 72 hours.   Nutrition Note  Spoke with pt. Nutrition Plan and Nutrition Survey goals reviewed with pt. Pt is following a Heart Healthy diet.   Pt A1C 5.7%. He reports type 2 diabetes in family history. He has had values in the  pre diabetes range in the past. He is aware and watching this number and has made appropriate changes to his diet (ie cut out sugar in coffee, reduced/eliminated sugary beverages).   He reports his wife cooking and monitoring his diet. She ensures he makes healthy choices by avoiding salt on food, reading some labels, limiting carbohydrates, etc.   Pt expressed understanding of the information reviewed.    Nutrition Diagnosis ? Food-and nutrition-related knowledge deficit related to lack of exposure to information as related to diagnosis of: ? CVD ? Pre-diabetes  Nutrition Intervention ? Pt's individual nutrition plan reviewed with pt. ? Benefits of adopting Heart Healthy diet discussed when Medficts reviewed.   ? Pt given handouts for: ? Nutrition I class ? Nutrition II class ?Continue client-centered nutrition education by RD, as part of interdisciplinary care.  Goal(s) ? Pt to build a healthy plate including  vegetables, fruits, whole grains, and low-fat dairy products in a heart healthy meal plan.   Plan:    Will provide client-centered nutrition education as part of interdisciplinary care  Monitor and evaluate progress toward nutrition goal with team.   Michaele Offer, MS, RDN, LDN

## 2019-08-05 NOTE — Telephone Encounter (Signed)
He needs to ask Dr. Elsworth Soho in Pulmonary for this (he treats the sleep apnea)

## 2019-08-06 ENCOUNTER — Other Ambulatory Visit: Payer: Self-pay

## 2019-08-06 ENCOUNTER — Telehealth: Payer: Self-pay | Admitting: Pulmonary Disease

## 2019-08-06 ENCOUNTER — Encounter (HOSPITAL_COMMUNITY)
Admission: RE | Admit: 2019-08-06 | Discharge: 2019-08-06 | Disposition: A | Discharge status: Home / Self Care | Payer: PPO | Source: Ambulatory Visit | Attending: Cardiology | Admitting: Cardiology

## 2019-08-06 DIAGNOSIS — I208 Other forms of angina pectoris: Secondary | ICD-10-CM | POA: Diagnosis not present

## 2019-08-06 NOTE — Telephone Encounter (Signed)
Spoke with patient. I advised him that we would be able to place an order for him to switch to [place] but they will request to have OV notes from the last 30 days to show that he is still using his cpap machine. He was last seen in 2018. He stated that he did not know that he needed to follow up at least a year and had been paying out of pocket for his supplies.   He has been scheduled to see RA on 08/28/19 at 12pm. Will send patient a MyChart message as requested with our new address.   Nothing further needed at time of call.

## 2019-08-08 ENCOUNTER — Other Ambulatory Visit: Payer: Self-pay

## 2019-08-08 ENCOUNTER — Encounter (HOSPITAL_COMMUNITY)
Admission: RE | Admit: 2019-08-08 | Discharge: 2019-08-08 | Disposition: A | Discharge status: Home / Self Care | Payer: PPO | Source: Ambulatory Visit | Attending: Cardiology | Admitting: Cardiology

## 2019-08-08 DIAGNOSIS — I208 Other forms of angina pectoris: Secondary | ICD-10-CM | POA: Diagnosis not present

## 2019-08-08 NOTE — Addendum Note (Signed)
Addended by: Alysia Penna A on: 08/08/2019 07:30 AM   Modules accepted: Orders

## 2019-08-11 ENCOUNTER — Encounter (HOSPITAL_COMMUNITY)
Admission: RE | Admit: 2019-08-11 | Discharge: 2019-08-11 | Disposition: A | Discharge status: Home / Self Care | Payer: PPO | Source: Ambulatory Visit | Attending: Cardiology | Admitting: Cardiology

## 2019-08-11 ENCOUNTER — Other Ambulatory Visit: Payer: Self-pay

## 2019-08-11 DIAGNOSIS — I208 Other forms of angina pectoris: Secondary | ICD-10-CM | POA: Diagnosis not present

## 2019-08-11 NOTE — Progress Notes (Signed)
Discharge Progress Report  Patient Details  Name: Gabriel Aguilar MRN: [id number] Date of Birth: 1951/03/16 Referring Provider:     [place] from 07/24/2019 in [place]  Referring Provider  Gabriel Aguilar       Number of Visits: 7  Reason for Discharge:  Early Exit:  Gabriel Aguilar has decided to continue exercise on his own  Smoking History:  Social History   Tobacco Use  Smoking Status Former Smoker  . Packs/day: 1.50  . Start date: 04/10/1964  . Quit date: 03/06/1983  . Years since quitting: 36.4  Smokeless Tobacco Never Used    Diagnosis:  Stable angina ([place])  ADL [facility]SD:   Initial Exercise Prescription: Initial Exercise Prescription - 07/24/19 1600      Date of Initial Exercise RX and Referring Provider   Date  07/24/19    Referring Provider  Gabriel Aguilar    Expected Discharge Date  09/19/19      Treadmill   MPH  2.3    Grade  1    Minutes  15    METs  3.08      NuStep   Level  2    SPM  85    Minutes  15    METs  2.5      Prescription Details   Frequency (times per week)  3x    Duration  Progress to 30 minutes of continuous aerobic without signs/symptoms of physical distress      Intensity   THRR 40-80% of Max Heartrate  60-120    Ratings of Perceived Exertion  11-13    Perceived Dyspnea  0-4      Progression   Progression  Continue progressive overload as per policy without signs/symptoms or physical distress.      Resistance Training   Training Prescription  Yes    Weight  4lbs    Reps  10-15       Discharge Exercise Prescription (Final Exercise Prescription Changes): Exercise Prescription Changes - 08/11/19 0926      Response to Exercise   Blood Pressure (Admit)  116/60    Blood Pressure (Exercise)  144/78    Blood Pressure (Exit)  114/56    Heart Rate (Admit)  68 bpm    Heart Rate (Exercise)  103 bpm    Heart Rate (Exit)  74 bpm    Rating of Perceived Exertion (Exercise)   12    Perceived Dyspnea (Exercise)  0    Symptoms  None    Comments  Pt's last day of exercise     Duration  Continue with 30 min of aerobic exercise without signs/symptoms of physical distress.    Intensity  THRR unchanged      Progression   Progression  Continue to progress workloads to maintain intensity without signs/symptoms of physical distress.    Average METs  4.4      Resistance Training   Training Prescription  No      Treadmill   MPH  3    Grade  3    Minutes  15    METs  4.54      NuStep   Level  4    SPM  105    Minutes  15    METs  4.4      Home Exercise Plan   Plans to continue exercise at  [facility] (comment)    Frequency  Add 3 additional days to  program exercise sessions.    Initial Home Exercises Provided  08/11/19       Functional Capacity: 6 Minute Walk    Row Name 07/24/19 1542         6 Minute Walk   Phase  Initial     Distance  1664 feet     Walk Time  6 minutes     # of Rest Breaks  0     MPH  3.2     METS  3.3     RPE  11     Perceived Dyspnea   1     VO2 Peak  11.8     Symptoms  Yes (comment)     Comments  Mild SOB +1     Resting HR  68 bpm     Resting BP  118/60     Resting Oxygen Saturation   97 %     Exercise Oxygen Saturation  during 6 min walk  97 %     Max Ex. HR  92 bpm     Max Ex. BP  138/78     2 Minute Post BP  120/60        Psychological, QOL, Others - Outcomes: PHQ 2/9: Depression screen [facility] 2/9 07/24/2019 05/13/2018 05/13/2018 01/27/2015  Decreased Interest 0 0 0 0  Down, Depressed, Hopeless 0 0 0 0  PHQ - 2 Score 0 0 0 0  Altered sleeping - 3 - -  Tired, decreased energy - 1 - -  Change in appetite - 0 - -  Feeling bad or failure about yourself  - 0 - -  Trouble concentrating - 0 - -  Moving slowly or fidgety/restless - 0 - -  Suicidal thoughts - 0 - -  PHQ-9 Score - 4 - -    Quality of Life: Quality of Life - 07/24/19 1609      Quality of Life   Select  Quality of Life      Quality of Life  Scores   Health/Function Pre  24.13 %    Socioeconomic Pre  29.14 %    Psych/Spiritual Pre  27.43 %    Family Pre  28.8 %    GLOBAL Pre  26.53 %       Personal Goals: Goals established at orientation with interventions provided to work toward goal. Personal Goals and Risk Factors at Admission - 07/24/19 1632      Core Components/Risk Factors/Patient Goals on Admission    Weight Management  Yes;Weight Maintenance;Weight Loss    Intervention  Weight Management: Develop a combined nutrition and exercise program designed to reach desired caloric intake, while maintaining appropriate intake of nutrient and fiber, sodium and fats, and appropriate energy expenditure required for the weight goal.;Weight Management: Provide education and appropriate resources to help participant work on and attain dietary goals.    Expected Outcomes  Short Term: Continue to assess and modify interventions until short term weight is achieved;Long Term: Adherence to nutrition and physical activity/exercise program aimed toward attainment of established weight goal;Weight Maintenance: Understanding of the daily nutrition guidelines, which includes 25-35% calories from fat, 7% or less cal from saturated fats, less than 200mg  cholesterol, less than 1.5gm of sodium, & 5 or more servings of fruits and vegetables daily;Weight Loss: Understanding of general recommendations for a balanced deficit meal plan, which promotes 1-2 lb weight loss per week and includes a negative energy balance of [id number] kcal/d;Understanding recommendations for meals to include 15-35% energy  as protein, 25-35% energy from fat, 35-60% energy from carbohydrates, less than 200mg  of dietary cholesterol, 20-35 gm of total fiber daily;Understanding of distribution of calorie intake throughout the day with the consumption of 4-5 meals/snacks    Hypertension  Yes    Intervention  Monitor prescription use compliance.;Provide education on lifestyle modifcations  including regular physical activity/exercise, weight management, moderate sodium restriction and increased consumption of fresh fruit, vegetables, and low fat dairy, alcohol moderation, and smoking cessation.    Expected Outcomes  Short Term: Continued assessment and intervention until BP is < 140/30mm HG in hypertensive participants. < 130/61mm HG in hypertensive participants with diabetes, heart failure or chronic kidney disease.;Long Term: Maintenance of blood pressure at goal levels.    Lipids  Yes    Intervention  Provide education and support for participant on nutrition & aerobic/resistive exercise along with prescribed medications to achieve LDL 70mg , HDL >40mg .    Expected Outcomes  Short Term: Participant states understanding of desired cholesterol values and is compliant with medications prescribed. Participant is following exercise prescription and nutrition guidelines.;Long Term: Cholesterol controlled with medications as prescribed, with individualized exercise RX and with personalized nutrition plan. Value goals: LDL < 70mg , HDL > 40 mg.        Personal Goals Discharge: Goals and Risk Factor Review    Row Name 07/29/19 0815 08/19/19 1441           Core Components/Risk Factors/Patient Goals Review   Personal Goals Review  Weight Management/Obesity;Lipids;Hypertension  Weight Management/Obesity;Lipids;Hypertension      Review  Bria started exercise on 07/28/19 and did well.  Lake completed exercise early so that he could retrun to the gym to exercise with his wife.      Expected Outcomes  Dov will continue to participate in phase 2 cardiac rehab for exercise, nutrtion and lifestyle modifications.  Geoffry will continue to exercise on his own after discharge from phase 2 cardiac rehab to continue exercise, nutrtion and lifestyle modifications.         Exercise Goals and Review: Exercise Goals    Row Name 07/24/19 1544             Exercise Goals   Increase Physical  Activity  Yes       Intervention  Provide advice, education, support and counseling about physical activity/exercise needs.;Develop an individualized exercise prescription for aerobic and resistive training based on initial evaluation findings, risk stratification, comorbidities and participant's personal goals.       Expected Outcomes  Short Term: Attend rehab on a regular basis to increase amount of physical activity.;Long Term: Add in home exercise to make exercise part of routine and to increase amount of physical activity.;Long Term: Exercising regularly at least 3-5 days a week.       Increase Strength and Stamina  Yes       Intervention  Provide advice, education, support and counseling about physical activity/exercise needs.;Develop an individualized exercise prescription for aerobic and resistive training based on initial evaluation findings, risk stratification, comorbidities and participant's personal goals.       Expected Outcomes  Short Term: Increase workloads from initial exercise prescription for resistance, speed, and METs.;Short Term: Perform resistance training exercises routinely during rehab and add in resistance training at home;Long Term: Improve cardiorespiratory fitness, muscular endurance and strength as measured by increased METs and functional capacity (6MWT)       Able to understand and use rate of perceived exertion (RPE) scale  Yes  Intervention  Provide education and explanation on how to use RPE scale       Expected Outcomes  Long Term:  Able to use RPE to guide intensity level when exercising independently;Short Term: Able to use RPE daily in rehab to express subjective intensity level       Knowledge and understanding of Target Heart Rate Range (THRR)  Yes       Intervention  Provide education and explanation of THRR including how the numbers were predicted and where they are located for reference       Expected Outcomes  Short Term: Able to state/look up THRR;Long  Term: Able to use THRR to govern intensity when exercising independently;Short Term: Able to use daily as guideline for intensity in rehab       Able to check pulse independently  Yes       Intervention  Provide education and demonstration on how to check pulse in carotid and radial arteries.;Review the importance of being able to check your own pulse for safety during independent exercise       Expected Outcomes  Short Term: Able to explain why pulse checking is important during independent exercise;Long Term: Able to check pulse independently and accurately       Understanding of Exercise Prescription  Yes       Intervention  Provide education, explanation, and written materials on patient's individual exercise prescription       Expected Outcomes  Short Term: Able to explain program exercise prescription;Long Term: Able to explain home exercise prescription to exercise independently          Exercise Goals Re-Evaluation:   Nutrition & Weight - Outcomes: Pre Biometrics - 07/24/19 1544      Pre Biometrics   Height  5\' 9"  (1.753 m)    Weight  92 kg    Waist Circumference  40.5 inches    Hip Circumference  42 inches    Waist to Hip Ratio  0.96 %    BMI (Calculated)  29.94    Triceps Skinfold  18 mm    % Body Fat  29.3 %    Grip Strength  52 kg    Flexibility  15 in    Single Leg Stand  10.81 seconds        Nutrition: Nutrition Therapy & Goals - 08/12/19 1323      Nutrition Therapy   Diet  Heart Healthy      Personal Nutrition Goals   Nutrition Goal  Pt to build a healthy plate including vegetables, fruits, whole grains, and low-fat dairy products in a heart healthy meal plan.      Intervention Plan   Intervention  Nutrition handout(s) given to patient.    Expected Outcomes  Short Term Goal: Understand basic principles of dietary content, such as calories, fat, sodium, cholesterol and nutrients.       Nutrition Discharge: Nutrition Assessments - 07/31/19 1152       MEDFICTS Scores   Pre Score  42       Education Questionnaire Score: Knowledge Questionnaire Score - 07/24/19 1547      Knowledge Questionnaire Score   Pre Score  21/24      Fritz Pickerel attended 7 exercise sessions between 07/28/19-08/11/19.Mihailo's attendance was good.Archimedes did well with exercise.Cordy had some moderate exertional BP elevations. Garnett's resting BP's were within normal limits. Brenndon decided to complete the program early  [facility].Barnet Pall, RN,BSN  08/19/2019 2:57 PM

## 2019-08-13 ENCOUNTER — Encounter (HOSPITAL_COMMUNITY): Payer: PPO

## 2019-08-13 ENCOUNTER — Telehealth: Payer: PPO

## 2019-08-14 ENCOUNTER — Telehealth: Payer: Self-pay | Admitting: Cardiology

## 2019-08-14 ENCOUNTER — Other Ambulatory Visit: Payer: Self-pay

## 2019-08-14 ENCOUNTER — Ambulatory Visit: Payer: PPO

## 2019-08-14 ENCOUNTER — Other Ambulatory Visit: Payer: Self-pay | Admitting: Cardiology

## 2019-08-14 DIAGNOSIS — K219 Gastro-esophageal reflux disease without esophagitis: Secondary | ICD-10-CM

## 2019-08-14 DIAGNOSIS — I2510 Atherosclerotic heart disease of native coronary artery without angina pectoris: Secondary | ICD-10-CM

## 2019-08-14 DIAGNOSIS — I214 Non-ST elevation (NSTEMI) myocardial infarction: Secondary | ICD-10-CM

## 2019-08-14 DIAGNOSIS — E785 Hyperlipidemia, unspecified: Secondary | ICD-10-CM

## 2019-08-14 DIAGNOSIS — I480 Paroxysmal atrial fibrillation: Secondary | ICD-10-CM

## 2019-08-14 DIAGNOSIS — I10 Essential (primary) hypertension: Secondary | ICD-10-CM

## 2019-08-14 MED ORDER — NITROGLYCERIN 0.4 MG SL SUBL: 0.4000 mg | SUBLINGUAL_TABLET | SUBLINGUAL | 1 refills | Status: AC | PRN

## 2019-08-14 MED ORDER — AMLODIPINE BESYLATE 10 MG PO TABS: 10.0000 mg | ORAL_TABLET | Freq: Every day | ORAL | 1 refills | Status: DC

## 2019-08-14 MED ORDER — HYDROCHLOROTHIAZIDE 25 MG PO TABS: 25.0000 mg | ORAL_TABLET | Freq: Every day | ORAL | 1 refills | Status: DC

## 2019-08-14 MED ORDER — RIVAROXABAN 20 MG PO TABS: 20.0000 mg | ORAL_TABLET | Freq: Every day | ORAL | 1 refills | Status: DC

## 2019-08-14 MED ORDER — CLOPIDOGREL BISULFATE 75 MG PO TABS: 75.0000 mg | ORAL_TABLET | Freq: Every day | ORAL | 1 refills | Status: DC

## 2019-08-14 MED ORDER — ATORVASTATIN CALCIUM 80 MG PO TABS: ORAL_TABLET | ORAL | 1 refills | Status: DC

## 2019-08-14 MED ORDER — FENOFIBRATE 54 MG PO TABS: 54.0000 mg | ORAL_TABLET | Freq: Every day | ORAL | 3 refills | Status: DC

## 2019-08-14 NOTE — Chronic Care Management (AMB) (Signed)
Verbal consent obtained for [facility] enhanced pharmacy services (medication synchronization, adherence packaging, delivery coordination). A medication sync plan was created to allow patient to get all medications delivered once every 30 to 90 days per patient preference. Patient understands they have freedom to choose pharmacy and clinical pharmacist will coordinate care between all prescribers and [facility].  Patient obtains medications through Vials  90 Days   Patient is due for next delivery on: 08/15/2019  This delivery to include: Fenofibrate 54mg   Hydrochlorothiazide 25mg   Xarelto 20mg    Sync Date: 10/29/2019  Coordinated delivery for the following medications:  Amlodipine 10mg  10/29/2019  Atorvastatin 80mg  09/25/2019  Clopidogrel 75mg  10/29/2019  Nitroglycerin 0.4mg  SL Uses PRN; patient instructed to call when needed  Omeprazole 40mg   10/29/2019    Patient needs refills for the following. Refills have been requested from the following providers:   [name]  Amlodipine 10mg    Atorvastatin 80mg   Clopidogrel 75mg   Nitroglycerin 0.4mg  SL  Omeprazole 40mg    Xarelto 20mg  (last refilled by Dr. Sarajane Jews; pending response if Dr. [name] will refill)  Fenofibrate 54mg  (last refilled by Dr. Sarajane Jews; pending response if Dr. [name] will refill)  Hydrochlorothiazide 25mg (last refilled by Dr. Sarajane Jews; pending response if Dr. [name] will refill)   Confirmed delivery date of 08/15/2019, advised patient that pharmacy will contact them the morning of delivery or day before.  Anson Crofts, PharmD Clinical Pharmacist [facility] [place] [phone]

## 2019-08-14 NOTE — Telephone Encounter (Signed)
Rx(s) sent to pharmacy electronically.  

## 2019-08-14 NOTE — Telephone Encounter (Signed)
Pt c/o medication issue:  1. Name of Medication: fenofibrate 54 MG tablet, hydrochlorothiazide (HYDRODIURIL) 25 MG tablet, rivaroxaban (XARELTO) 20 MG TABS tablet   2. How are you currently taking this medication (dosage and times per day)? As directed  3. Are you having a reaction (difficulty breathing--STAT)? no  4. What is your medication issue? Annette from [facility] states that the patient would like Dr. [name] to begin refilling this medication for him. If approved, please send refill to [facility].

## 2019-08-14 NOTE — Chronic Care Management (AMB) (Signed)
Chronic Care Management Pharmacy  Name: Gabriel Aguilar  MRN: [id number] DOB: 10-18-1950  Initial Questions: 1. Have you seen any other providers since your last visit? NA 2. Any changes in your medicines or health? No   Chief Complaint/ HPI  Gabriel Aguilar,  69 y.o. , male presents for their Initial CCM visit with the clinical pharmacist via telephone due to COVID-19 Pandemic.  PCP : Laurey Morale, MD  Their chronic conditions include: HTN, HLD, Paroxysmal Afib, CAD/NSTEMI, GERD  Office Visits: 06/06/2019- Alysia Penna, MD- Patient presented for office visit for high BP and nasal congestion. Patient recommended to stop Afrin since may be contributing to elevated BP readings. Patient to try Flonase. Patient to try Allergra for PND.   Consult Visit: 07/14/2019- Oncology- Lurline Del, MD- Patient presented for office visit for follow up of  of T-cell lymphocytosis. Plan for treatment if indications present: develops anemia or thrombocytopenia related to bone marrow, 10% body weight loss with no clear reason, severely fatigue, drenching sweats, unexplained pruritis. Patient to follow up yearly unless indicated problems present.   06/13/2019- Cardiology- Rosaria Ferries, PA- Patient presented for office visit for cardiology follow up. Patient reported dyspnea on exertion and was referred to cardiac rehab. No changed made. Patient to follow up with Dr. [name].   Medications: Outpatient Encounter Medications as of 08/14/2019  Medication Sig  . Ascorbic Acid (VITAMIN C) 1000 MG tablet Take 1,000 mg by mouth daily.  . fluticasone (FLONASE) 50 MCG/ACT nasal spray Place 1 spray into both nostrils at bedtime.  . OMEGA 3 1000 MG CAPS Take 1 capsule by mouth daily.   [name] omeprazole (PRILOSEC) 40 MG capsule Take 1 capsule by mouth once daily  . [DISCONTINUED] amLODipine (NORVASC) 10 MG tablet Take 1 tablet by mouth once daily  . [DISCONTINUED] atorvastatin (LIPITOR) 80 MG tablet TAKE 1 TABLET BY  MOUTH ONCE DAILY AT  6  PM  . [DISCONTINUED] clopidogrel (PLAVIX) 75 MG tablet TAKE 1 TABLET BY MOUTH DAILY WITH  BREAKFAST  . [DISCONTINUED] fenofibrate 54 MG tablet Take 1 tablet (54 mg total) by mouth daily.  . [DISCONTINUED] hydrochlorothiazide (HYDRODIURIL) 25 MG tablet Take 1 tablet (25 mg total) by mouth daily.  . [DISCONTINUED] nitroGLYCERIN (NITROSTAT) 0.4 MG SL tablet Place 1 tablet (0.4 mg total) under the tongue every 5 (five) minutes as needed for chest pain.  . [DISCONTINUED] rivaroxaban (XARELTO) 20 MG TABS tablet Take 1 tablet (20 mg total) by mouth daily with supper.   No facility-administered encounter medications on file as of 08/14/2019.    Current Diagnosis/Assessment:  Goals Addressed            This Visit's Progress   . Pharmacy Care Plan       CARE PLAN ENTRY  Current Barriers:  . Chronic Disease Management support, education, and care coordination needs related to Hypertension, Hyperlipidemia, Atrial Fibrillation, Coronary Artery Disease, and Gastroesophageal Reflux Disease   Hypertension . Pharmacist Clinical Goal(s): o Over the next 180 days, patient will work with PharmD and providers to maintain BP goal <130/80 . Current regimen:   Hydrochlorothiazide 25mg , 1 tablet once daily  Amlodipine 10mg , 1 tablet once daily  . Interventions: . Discussed diet modifications. DASH diet:  following a diet emphasizing fruits and vegetables and low-fat dairy products along with whole grains, fish, poultry, and nuts. Reducing red meats and sugars.  . Patient self care activities - Over the next 180 days, patient will: o Check BP at home, document, and provide at  future appointments o Ensure daily salt intake < 2300 mg/day  Hyperlipidemia . Pharmacist Clinical Goal(s): o Over the next 180 days, patient will work with PharmD and providers to maintain LDL goal < 70 . Current regimen:   Atorvastatin 80mg , 1 tablet once daily at 6 pm  Fenofibrate 54mg , 1 tablet once  daily  Omega-3 1000mg , 1 capsule once daily . Interventions: . We discussed how a diet high in plant sterols (fruits/vegetables/nuts/whole grains/legumes) may reduce your cholesterol.  Encouraged increasing fiber to a daily intake of 10-25g/day  . Patient self care activities - Over the next 180 days, patient will: o Continue current medication and lifestyle modifications (diet and exercise)  Paroxysmal AFIB . Pharmacist Clinical Goal(s) o Over the next 180 days, patient will continue current medication to prevent blood clots. . Current regimen:  o Xarelto 20mg , 1 tablet once daily  . Interventions: o We discussed:  monitoring for signs and symptoms for bleeding (coughing up blood, prolonged nose bleeds, black, tarry stools). . Patient self care activities - Over the next 180 days, patient will: o Continue current medications.   Coronary artery disease/ NSTEMI . Pharmacist Clinical Goal(s) o Over the next 180 days, patient will work with PharmD and providers and keeping taking current medications.  . Current regimen:   Clopidogrel 75mg , 1 tablet once daily   Nitroglycerin 0.4mg , 1 tablet under tongue as needed for chest pain  . Patient self care activities o Patient will continue current medications.  o Verify expiration date of nitroglycerin and keep up-to-date supply.   GERD . Pharmacist Clinical Goal(s) o Over the next 180 days, patient manage symptoms with medication and avoiding trigger foods/drinks.  . Current regimen:  o Omeprazole 40mg , 1 capsule once daily  . Interventions: o We discussed:  non-pharmacological interventions for acid reflux. Take measures to prevent acid reflux, such as avoiding spicy foods, avoiding caffeine, avoid laying down a few hours after eating, and raising the head of the bed . Patient self care activities o Patient will continue current medications.   Medication management . Pharmacist Clinical Goal(s): o Over the next 180 days, patient  will work with PharmD and providers to achieve optimal medication adherence . Interventions o Comprehensive medication review performed. o Utilize [facility] for medication synchronization, packaging and delivery . Patient self care activities - Over the next 180 days, patient will: o Take medications as prescribed o Report any questions or concerns to PharmD and/or provider(s)  Initial goal documentation       SDOH Interventions     Most Recent Value  SDOH Interventions  SDOH Interventions for the Following Domains  Financial Strain  Financial Strain Interventions  Other (Comment) [Not needed]     Hypertension  Patient reported going to cardiac rehab, but "graduated last Monday".  - Exercise: patient and spouse aim to go to gym everyday and walk every evening - Diet: consists of grilled chicken tacos, salmon Breakfast: oatmeals.  Patient and spouse split a meal when go out to eat. Drinks smoothies everyday in the afternoon.  - drinks coffee everyday  Reports occasional dizziness/ lightheadedness.   BP today is:  <140/90  Office blood pressures are  BP Readings from Last 3 Encounters:  07/24/19 118/60  07/14/19 (!) 160/72  06/13/19 124/72   Patient has failed these meds in the past: none  Patient checks BP at home several times per month  Patient home BP readings are ranging:  4/27: 133/57, 62 5/06: 131/71, 62  Patient is controlled:   Hydrochlorothiazide 25mg , 1 tablet once daily  Amlodipine 10mg , 1 tablet once daily   We discussed diet and exercise extensively  Plan Continue current medications and control with diet and exercise.   Hyperlipidemia   Lipid Panel     Component Value Date/Time   CHOL 171 05/28/2018 0318   TRIG 307 (H) 05/28/2018 0318   HDL 37 (L) 05/28/2018 0318   CHOLHDL 4.6 05/28/2018 0318   VLDL 61 (H) 05/28/2018 0318   LDLCALC 73 05/28/2018 0318   LDLDIRECT 97.0 05/14/2018 0755    The ASCVD Risk score (Goff DC Jr., et  al., 2013) failed to calculate for the following reasons:   The patient has a prior MI or stroke diagnosis   Patient has failed these meds in past: none   Patient is currently controlled on the following medications:   Atorvastatin 80mg , 1 tablet once daily at 6 pm  Fenofibrate 54mg , 1 tablet once daily  Omega-3 1000mg , 1 capsule once daily   We discussed:  diet and exercise extensively  Plan Recommend lipid panel. Continue current medications  Paroxysmal AFIB   Patient has failed these meds in past: 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) Patient is currently controlled on no medications.  Anticoagulation:   Xarelto 20mg , 1 tablet once daily   We discussed:  monitoring for signs and symptoms for bleeding (coughing up blood, prolonged nose bleeds, black, tarry stools).  Plan Continue current medications  CAD/NSTEMI  Patient reports not needing nitroglycerin since stent procedure.   Cardiology note on 06/13/2018 stated patient should be on clopidogrel for at least a year.   Patient has failed these meds in past: none   Patient is currently controlled on the following medications:   Clopidogrel 75mg , 1 tablet once daily   Nitroglycerin 0.4mg , 1 tablet under tongue as needed for chest pain   We discussed:  checking nitroglycerin expiration dates and replacing as needed   Plan Continue current medications  Patient to follow up with cardiology office (Dr. [name]).   GERD   Patient reports taking only as needed. Patient reports he can go 2 to 4 days without it.   Patient has failed these meds in past: none Patient is currently controlled on the following medications:   Omeprazole 40mg , 1 capsule once daily   We discussed:  non-pharmacological interventions for acid reflux. Take measures to prevent acid reflux, such as avoiding spicy foods, avoiding caffeine, avoid laying down a few hours after eating, and raising the head of the bed.  Plan Continue current  medications.   OTC/ supplements   Patient does not want to stop vitamin C as he reports taking for immune health.   Patient is currently on the following:   Ascorbic acid (Vitamin C) 1000mg , 1 tablet once daily (recommended to drink plenty water with it)   Fluticasone 38mcg/act nasal spray, 1 spray into both nostrils at bedtime   Plan  Recommended to drink plenty of water with vitamin C.   Medication Management  Patient organizes medications: patient states it is a habit; takes every morning and evening. Uses Alexa for medication reminders  Primary Pharmacy: [facility]  Adherence: gaps in therapy   Atorvastatin 80mg  (last filled 02/27/2019, 90DS; 06/28/19)  Fenofibrate 54mg  (last filled 05/05/2019, 90DS)  Omeprazole 40mg  (last filled 02/25/19, 90DS; 08/02/2019)  Xarelto 20mg  (last filled 05/05/19, 90DS)  Amlodipine 10mg  (last filled 03/31/19, 90DS; 08/02/19)  HCTZ 25mg  (last filled 05/02/19, 90DS)   Patient reported looking at changing pharmacies  due to previous issues. Patient to benefit from [facility] enhanced [facility] services.   Verbal consent obtained for [facility] enhanced pharmacy services (medication synchronization, adherence packaging, delivery coordination). A medication sync plan was created to allow patient to get all medications delivered once every 30 to 90 days per patient preference. Patient understands they have freedom to choose pharmacy and clinical pharmacist will coordinate care between all prescribers and [facility].    Follow up Follow up visit with PharmD in 6 months. Will conduct general telephone calls for periodic check-ins before next visit.   Anson Crofts, PharmD Clinical Pharmacist [facility] [place] [phone]

## 2019-08-14 NOTE — Telephone Encounter (Signed)
*  STAT* If patient is at the pharmacy, call can be transferred to refill team.   1. Which medications need to be refilled? (please list name of each medication and dose if known) amLODipine (NORVASC) 10 MG tablet, atorvastatin (LIPITOR) 80 MG tablet, clopidogrel (PLAVIX) 75 MG tablet, nitroGLYCERIN (NITROSTAT) 0.4 MG SL tablet  2. Which pharmacy/location (including street and city if local pharmacy) is medication to be sent to? [facility]  3. Do they need a 30 day or 90 day supply? 90  Patient is now Sports administrator.

## 2019-08-15 ENCOUNTER — Encounter (HOSPITAL_COMMUNITY): Payer: PPO

## 2019-08-18 ENCOUNTER — Encounter (HOSPITAL_COMMUNITY): Payer: PPO

## 2019-08-20 ENCOUNTER — Encounter (HOSPITAL_COMMUNITY): Payer: PPO

## 2019-08-22 ENCOUNTER — Encounter (HOSPITAL_COMMUNITY): Payer: PPO

## 2019-08-22 NOTE — Patient Instructions (Addendum)
Visit Information  Goals Addressed            This Visit's Progress   . Pharmacy Care Plan       CARE PLAN ENTRY  Current Barriers:  . Chronic Disease Management support, education, and care coordination needs related to Hypertension, Hyperlipidemia, Atrial Fibrillation, Coronary Artery Disease, and Gastroesophageal Reflux Disease   Hypertension . Pharmacist Clinical Goal(s): o Over the next 180 days, patient will work with PharmD and providers to maintain BP goal <130/80 . Current regimen:   Hydrochlorothiazide 25mg , 1 tablet once daily  Amlodipine 10mg , 1 tablet once daily  . Interventions: . Discussed diet modifications. DASH diet:  following a diet emphasizing fruits and vegetables and low-fat dairy products along with whole grains, fish, poultry, and nuts. Reducing red meats and sugars.  . Patient self care activities - Over the next 180 days, patient will: o Check BP at home, document, and provide at future appointments o Ensure daily salt intake < 2300 mg/day  Hyperlipidemia . Pharmacist Clinical Goal(s): o Over the next 180 days, patient will work with PharmD and providers to maintain LDL goal < 70 . Current regimen:   Atorvastatin 80mg , 1 tablet once daily at 6 pm  Fenofibrate 54mg , 1 tablet once daily  Omega-3 1000mg , 1 capsule once daily . Interventions: . We discussed how a diet high in Aguilar sterols (fruits/vegetables/nuts/whole grains/legumes) may reduce your cholesterol.  Encouraged increasing fiber to a daily intake of 10-25g/day  . Patient self care activities - Over the next 180 days, patient will: o Continue current medication and lifestyle modifications (diet and exercise)  Paroxysmal AFIB . Pharmacist Clinical Goal(s) o Over the next 180 days, patient will continue current medication to prevent blood clots. . Current regimen:  o Xarelto 20mg , 1 tablet once daily  . Interventions: o We discussed:  monitoring for signs and symptoms for bleeding  (coughing up blood, prolonged nose bleeds, black, tarry stools). . Patient self care activities - Over the next 180 days, patient will: o Continue current medications.   Coronary artery disease/ NSTEMI . Pharmacist Clinical Goal(s) o Over the next 180 days, patient will work with PharmD and providers and keeping taking current medications.  . Current regimen:   Clopidogrel 75mg , 1 tablet once daily   Nitroglycerin 0.4mg , 1 tablet under tongue as needed for chest pain  . Patient self care activities o Patient will continue current medications.  o Verify expiration date of nitroglycerin and keep up-to-date supply.   GERD . Pharmacist Clinical Goal(s) o Over the next 180 days, patient manage symptoms with medication and avoiding trigger foods/drinks.  . Current regimen:  o Omeprazole 40mg , 1 capsule once daily  . Interventions: o We discussed:  non-pharmacological interventions for acid reflux. Take measures to prevent acid reflux, such as avoiding spicy foods, avoiding caffeine, avoid laying down a few hours after eating, and raising the head of the bed . Patient self care activities o Patient will continue current medications.   Medication management . Pharmacist Clinical Goal(s): o Over the next 180 days, patient will work with PharmD and providers to achieve optimal medication adherence . Interventions o Comprehensive medication review performed. o Utilize [facility] for medication synchronization, packaging and delivery . Patient self care activities - Over the next 180 days, patient will: o Take medications as prescribed o Report any questions or concerns to PharmD and/or provider(s)  Initial goal documentation        Gabriel Aguilar was given information about Chronic  Care Management services today including:  1. CCM service includes personalized support from designated clinical staff supervised by his physician, including individualized plan of care and coordination  with other care providers 2. 24/7 contact phone numbers for assistance for urgent and routine care needs. 3. Standard insurance, coinsurance, copays and deductibles apply for chronic care management only during months in which we provide at least 20 minutes of these services. Most insurances cover these services at 100%, however patients may be responsible for any copay, coinsurance and/or deductible if applicable. This service may help you avoid the need for more expensive face-to-face services. 4. Only one practitioner may furnish and bill the service in a calendar month. 5. The patient may stop CCM services at any time (effective at the end of the month) by phone call to the office staff.  Patient agreed to services and verbal consent obtained.   The patient verbalized understanding of instructions provided today and agreed to receive a mailed copy of patient instruction and/or educational materials. Telephone follow up appointment with pharmacy team member scheduled for: 02/10/2020  Anson Crofts, PharmD Clinical Pharmacist [facility] [place] [phone]   High Cholesterol  High cholesterol is a condition in which the blood has high levels of a white, waxy, fat-like substance (cholesterol). The human body needs small amounts of cholesterol. The liver makes all the cholesterol that the body needs. Extra (excess) cholesterol comes from the food that we eat. Cholesterol is carried from the liver by the blood through the blood vessels. If you have high cholesterol, deposits (plaques) may build up on the walls of your blood vessels (arteries). Plaques make the arteries narrower and stiffer. Cholesterol plaques increase your risk for heart attack and stroke. Work with your health care provider to keep your cholesterol levels in a healthy range. What increases the risk? This condition is more likely to develop in people who:  Eat foods that are high in animal fat  (saturated fat) or cholesterol.  Are overweight.  Are not getting enough exercise.  Have a family history of high cholesterol. What are the signs or symptoms? There are no symptoms of this condition. How is this diagnosed? This condition may be diagnosed from the results of a blood test.  If you are older than age 69, your health care provider may check your cholesterol every 4-6 years.  You may be checked more often if you already have high cholesterol or other risk factors for heart disease. The blood test for cholesterol measures:  "Bad" cholesterol (LDL cholesterol). This is the main type of cholesterol that causes heart disease. The desired level for LDL is less than 100.  "Good" cholesterol (HDL cholesterol). This type helps to protect against heart disease by cleaning the arteries and carrying the LDL away. The desired level for HDL is 60 or higher.  Triglycerides. These are fats that the body can store or burn for energy. The desired number for triglycerides is lower than 150.  Total cholesterol. This is a measure of the total amount of cholesterol in your blood, including LDL cholesterol, HDL cholesterol, and triglycerides. A healthy number is less than 200. How is this treated? This condition is treated with diet changes, lifestyle changes, and medicines. Diet changes  This may include eating more whole grains, fruits, vegetables, nuts, and fish.  This may also include cutting back on red meat and foods that have a lot of added sugar. Lifestyle changes  Changes may include getting at least  40 minutes of aerobic exercise 3 times a week. Aerobic exercises include walking, biking, and swimming. Aerobic exercise along with a healthy diet can help you maintain a healthy weight.  Changes may also include quitting smoking. Medicines  Medicines are usually given if diet and lifestyle changes have failed to reduce your cholesterol to healthy levels.  Your health care provider  may prescribe a statin medicine. Statin medicines have been shown to reduce cholesterol, which can reduce the risk of heart disease. Follow these instructions at home: Eating and drinking If told by your health care provider:  Eat chicken (without skin), fish, veal, shellfish, ground [place] breast, and round or loin cuts of red meat.  Do not eat fried foods or fatty meats, such as hot dogs and salami.  Eat plenty of fruits, such as apples.  Eat plenty of vegetables, such as broccoli, potatoes, and carrots.  Eat beans, peas, and lentils.  Eat grains such as barley, rice, couscous, and bulgur wheat.  Eat pasta without cream sauces.  Use skim or nonfat milk, and eat low-fat or nonfat yogurt and cheeses.  Do not eat or drink whole milk, cream, ice cream, egg yolks, or hard cheeses.  Do not eat stick margarine or tub margarines that contain trans fats (also called partially hydrogenated oils).  Do not eat saturated tropical oils, such as coconut oil and palm oil.  Do not eat cakes, cookies, crackers, or other baked goods that contain trans fats.  General instructions  Exercise as directed by your health care provider. Increase your activity level with activities such as gardening, walking, and taking the stairs.  Take over-the-counter and prescription medicines only as told by your health care provider.  Do not use any products that contain nicotine or tobacco, such as cigarettes and e-cigarettes. If you need help quitting, ask your health care provider.  Keep all follow-up visits as told by your health care provider. This is important. Contact a health care provider if:  You are struggling to maintain a healthy diet or weight.  You need help to start on an exercise program.  You need help to stop smoking. Get help right away if:  You have chest pain.  You have trouble breathing. This information is not intended to replace advice given to you by your health care provider.  Make sure you discuss any questions you have with your health care provider. Document Revised: 03/30/2017 Document Reviewed: 09/25/2015 Elsevier Patient Education  [employer].

## 2019-08-25 ENCOUNTER — Encounter (HOSPITAL_COMMUNITY): Payer: PPO

## 2019-08-25 DIAGNOSIS — D2239 Melanocytic nevi of other parts of face: Secondary | ICD-10-CM | POA: Diagnosis not present

## 2019-08-25 DIAGNOSIS — Z85828 Personal history of other malignant neoplasm of skin: Secondary | ICD-10-CM | POA: Diagnosis not present

## 2019-08-25 DIAGNOSIS — L821 Other seborrheic keratosis: Secondary | ICD-10-CM | POA: Diagnosis not present

## 2019-08-25 DIAGNOSIS — L853 Xerosis cutis: Secondary | ICD-10-CM | POA: Diagnosis not present

## 2019-08-25 DIAGNOSIS — L570 Actinic keratosis: Secondary | ICD-10-CM | POA: Diagnosis not present

## 2019-08-25 DIAGNOSIS — L820 Inflamed seborrheic keratosis: Secondary | ICD-10-CM | POA: Diagnosis not present

## 2019-08-25 DIAGNOSIS — D225 Melanocytic nevi of trunk: Secondary | ICD-10-CM | POA: Diagnosis not present

## 2019-08-25 DIAGNOSIS — D485 Neoplasm of uncertain behavior of skin: Secondary | ICD-10-CM | POA: Diagnosis not present

## 2019-08-25 DIAGNOSIS — D1801 Hemangioma of skin and subcutaneous tissue: Secondary | ICD-10-CM | POA: Diagnosis not present

## 2019-08-25 DIAGNOSIS — L905 Scar conditions and fibrosis of skin: Secondary | ICD-10-CM | POA: Diagnosis not present

## 2019-08-27 ENCOUNTER — Encounter (HOSPITAL_COMMUNITY): Payer: PPO

## 2019-08-28 ENCOUNTER — Ambulatory Visit: Payer: PPO | Admitting: Pulmonary Disease

## 2019-08-28 ENCOUNTER — Other Ambulatory Visit: Payer: Self-pay

## 2019-08-28 ENCOUNTER — Encounter: Payer: Self-pay | Admitting: Pulmonary Disease

## 2019-08-28 DIAGNOSIS — G4733 Obstructive sleep apnea (adult) (pediatric): Secondary | ICD-10-CM

## 2019-08-28 NOTE — Telephone Encounter (Signed)
Please advise. Thanks.  

## 2019-08-28 NOTE — Patient Instructions (Signed)
Prescription for Nasonex -1 spray each nare at bedtime  Call us back with name of DME and we can send prescription accordingly for CPAP supplies

## 2019-08-28 NOTE — Progress Notes (Signed)
   Subjective:    Patient ID: Gabriel Aguilar, male    DOB: 1950/07/19, 69 y.o.   MRN: [id number]  HPI  70 yo ex-smoker with atrial fibrillation for FU of obstructive sleep apnea.   Moderate OSA was noted on a study in 2003 and he has been on CPAP since  CPAP is worked well for him.  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 No problems with nasal pillows or pressure.  He has brought his machine.  Download was reviewed which shows excellent compliance on 8 cm without residual events and minimal leak.  He complains of nasal blockage even before he puts his CPAP on, this is a perennial problem.  He used a nasal decongestant prior but this elevated his blood pressure  He has not received Covid vaccines  Significant tests/ events reviewed  PSG 11/2001 showed AHI 20/h, predom apneas, longest 70 s, corrected by CPAP 8 cm  Review of Systems Patient denies significant dyspnea,cough, hemoptysis,  chest pain, palpitations, pedal edema, orthopnea, paroxysmal nocturnal dyspnea, lightheadedness, nausea, vomiting, abdominal or  leg pains      Objective:   Physical Exam   Gen. Pleasant, well-nourished, in no distress ENT - no thrush, no pallor/icterus,no post nasal drip,  Neck: No JVD, no thyromegaly, no carotid bruits Lungs: no use of accessory muscles, no dullness to percussion, clear without rales or rhonchi  Cardiovascular: Rhythm regular, heart sounds  normal, no murmurs or gallops, no peripheral edema Musculoskeletal: No deformities, no cyanosis or clubbing         Assessment & Plan:

## 2019-08-28 NOTE — Assessment & Plan Note (Signed)
He is very compliant with CPAP, this is certainly helped improve his daytime somnolence and fatigue Main issue now is nasal blockage, decongestant work but affected his blood pressure  Prescription for Nasonex -1 spray each nare at bedtime  He would like to change DME Call us back with name of DME and we can send prescription accordingly for CPAP supplies

## 2019-08-29 ENCOUNTER — Encounter (HOSPITAL_COMMUNITY): Payer: PPO

## 2019-08-29 NOTE — Addendum Note (Signed)
Addended by: Desmond Dike C on: 08/29/2019 03:19 PM   Modules accepted: Orders

## 2019-08-29 NOTE — Telephone Encounter (Signed)
Okay to change DME to [facility]

## 2019-09-01 ENCOUNTER — Encounter (HOSPITAL_COMMUNITY): Payer: PPO

## 2019-09-02 ENCOUNTER — Telehealth: Payer: Self-pay | Admitting: Pulmonary Disease

## 2019-09-02 NOTE — Telephone Encounter (Signed)
ATC [facility], was placed on a long hold. Will try back.

## 2019-09-03 ENCOUNTER — Encounter (HOSPITAL_COMMUNITY): Payer: PPO

## 2019-09-04 NOTE — Telephone Encounter (Signed)
Patient is returning phone call. Patient phone number is [phone].

## 2019-09-04 NOTE — Telephone Encounter (Signed)
Called and spoke with Bethanne Ginger from [place] she stated that they need patients last OV notes, most recent sleep study and OV notes from before the sleep study.  Called and spoke with Magda Paganini from [place] to see if they had copy of sleep study and they do not. They stated it looks like patient had sleep study in 2003.  ATC patient to see when and where he had sleep study done. LMTCB

## 2019-09-04 NOTE — Telephone Encounter (Signed)
Left message for patient to call back  

## 2019-09-05 ENCOUNTER — Encounter (HOSPITAL_COMMUNITY): Payer: PPO

## 2019-09-05 MED ORDER — MOMETASONE FUROATE 50 MCG/ACT NA SUSP
2.0000 | Freq: Every day | NASAL | 11 refills | Status: DC
Start: 2019-09-05 — End: 2020-08-26

## 2019-09-05 NOTE — Telephone Encounter (Signed)
Patient is returning phone call. Patient has copy of sleep study. Patient phone number is [phone].

## 2019-09-05 NOTE — Telephone Encounter (Signed)
Attempted to call pt but unable to reach. Left message for him to return call. °

## 2019-09-05 NOTE — Telephone Encounter (Signed)
Spoke with patient to let him know that I got a report from [place] and that I have faxed that as well as last OV note to [facility]. We would let him know if we needed anything else. Patient also requested to have Nasonex called into pharmacy. According to his last OV this was supposed to be ordered but was not. RX sent to [facility] per patient request. Nothing further needed at this time.

## 2019-09-05 NOTE — Telephone Encounter (Signed)
I called [facility] to see if they had copy of any sleep study. They faxed over what they had. I have faxed that paperwork and last OV to [facility].

## 2019-09-10 ENCOUNTER — Encounter (HOSPITAL_COMMUNITY): Payer: PPO

## 2019-09-12 ENCOUNTER — Encounter (HOSPITAL_COMMUNITY): Payer: PPO

## 2019-09-15 ENCOUNTER — Encounter (HOSPITAL_COMMUNITY): Payer: PPO

## 2019-09-17 ENCOUNTER — Encounter (HOSPITAL_COMMUNITY): Payer: PPO

## 2019-09-17 ENCOUNTER — Telehealth: Payer: Self-pay | Admitting: Pulmonary Disease

## 2019-09-17 DIAGNOSIS — G4733 Obstructive sleep apnea (adult) (pediatric): Secondary | ICD-10-CM

## 2019-09-17 NOTE — Telephone Encounter (Signed)
I called Tiffany but she was not available.  LM to call me back.   I called pt to schedule appt but he did not answer. LM for him to call back.   Referral Notes Number of Notes: 4 . Type Date User Summary Attachment  General 09/02/2019 8:28 AM Satira Anis - -  Note   Tressia Miners sent to Laretta Alstrom  I will call office regarding orders. Patient is needing a current office visit for compliance. chart notes prior to sleep studies, and copy of sleep studies.

## 2019-09-18 NOTE — Telephone Encounter (Signed)
We don't have OV notes that far back. Need to speak to [place] and figure out where we go from here. Routing to triage for follow up this morning.

## 2019-09-19 ENCOUNTER — Encounter (HOSPITAL_COMMUNITY): Payer: PPO

## 2019-09-21 NOTE — Progress Notes (Signed)
Cardiology Office Note   Date:  09/22/2019   ID:  Farzad Tibbetts, DOB 12/14/1950, MRN [id number]  PCP:  Laurey Morale, MD  Cardiologist:   [name], MD   Chief Complaint  Patient presents with  . Atrial Fibrillation      History of Present Illness: Gabriel Aguilar is a 69 y.o. male who presents for follow up of CAD and PAF.   On Saturday he had atrial fibrillation.  He knows when his illness and its not happening very frequently but he felt it and was somewhat fatigued when he went to mow the lawn.  He gets a little short of breath.  He feels slightly dizzy.  It persisted for about a day and a half.  He recorded it on his Chad.  Rates were from the 80s to about 109.  It resolved spontaneously.  He thought it might have been triggered by taking melatonin.  He said he has not had this happen in a while in fact maybe since the hospital.  He denies any chest pressure, neck or arm discomfort associated with this.  He denies any shortness of breath, PND or orthopnea.  He is otherwise felt okay and has been active doing yard work.    Past Medical History:  Diagnosis Date  . Arthritis   . Coronary artery disease   . Erectile dysfunction   . GERD (gastroesophageal reflux disease)   . Hiatal hernia   . History of pericarditis   . HTN (hypertension)   . Hyperlipidemia LDL goal <70   . Hypogonadism male    has seen  Dr. Orland Mustard  . Palpitation    a. montior 2 weeks 2014--> no arrhythmia  . Paroxysmal atrial fibrillation (HCC)   . Sleep apnea, central    wears a CPAP at night     Past Surgical History:  Procedure Laterality Date  . BACK SURGERY  1994   L4-5  . CARDIAC CATHETERIZATION    . COLONOSCOPY  07-09-12   per Dr. Deatra Ina, adenomatous polyp, repeat in 5 yrs   . CORONARY STENT INTERVENTION N/A 05/29/2018   Procedure: CORONARY STENT INTERVENTION;  Surgeon: Wellington Hampshire, MD;  Location: [facility];  Service: Cardiovascular;  Laterality: N/A;  RCA  . EYE  SURGERY     LASIK  . LEFT HEART CATH AND CORONARY ANGIOGRAPHY N/A 05/29/2018   Procedure: LEFT HEART CATH AND CORONARY ANGIOGRAPHY;  Surgeon: Wellington Hampshire, MD;  Location: [facility];  Service: Cardiovascular;  Laterality: N/A;  . 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 SPINE SURGERY  1994   disc at L4-L5  . VASECTOMY       Current Outpatient Medications  Medication Sig Dispense Refill  . amLODipine (NORVASC) 10 MG tablet Take 1 tablet (10 mg total) by mouth daily. 90 tablet 1  . Ascorbic Acid (VITAMIN C) 1000 MG tablet Take 1,000 mg by mouth daily.    [name] atorvastatin (LIPITOR) 80 MG tablet TAKE 1 TABLET BY MOUTH ONCE DAILY AT  6  PM 90 tablet 1  . clopidogrel (PLAVIX) 75 MG tablet Take 1 tablet (75 mg total) by mouth daily with breakfast. 90 tablet 1  . fenofibrate 54 MG tablet Take 1 tablet (54 mg total) by mouth daily. 90 tablet 3  . fluticasone (FLONASE) 50 MCG/ACT nasal spray Place 1 spray into both nostrils at bedtime.    . hydrochlorothiazide (HYDRODIURIL) 25  MG tablet Take 1 tablet (25 mg total) by mouth daily. 90 tablet 1  . mometasone (NASONEX) 50 MCG/ACT nasal spray Place 2 sprays into the nose daily. 17 g 11  . nitroGLYCERIN (NITROSTAT) 0.4 MG SL tablet Place 1 tablet (0.4 mg total) under the tongue every 5 (five) minutes as needed for chest pain. 25 tablet 1  . OMEGA 3 1000 MG CAPS Take 1 capsule by mouth daily.     [name] omeprazole (PRILOSEC) 40 MG capsule Take 1 capsule by mouth once daily 90 capsule 3  . rivaroxaban (XARELTO) 20 MG TABS tablet Take 1 tablet (20 mg total) by mouth daily with supper. 90 tablet 1  . metoprolol tartrate (LOPRESSOR) 25 MG tablet Take 0.5 tablets (12.5 mg total) by mouth every 8 (eight) hours as needed (ELEVATED HEART RATE). 45 tablet 3   No current facility-administered medications for this visit.    Allergies:   Patient has no known allergies.    ROS:  Please see the history of present illness.    Otherwise, review of systems are positive for none.   All other systems are reviewed and negative.    PHYSICAL EXAM: VS:  BP 114/72   Pulse 64   Ht 5\' 8"  (1.727 m)   Wt 200 lb (90.7 kg)   SpO2 95%   BMI 30.41 kg/m  , BMI Body mass index is 30.41 kg/m. GENERAL:  Well appearing NECK:  No jugular venous distention, waveform within normal limits, carotid upstroke brisk and symmetric, no bruits, no thyromegaly LUNGS:  Clear to auscultation bilaterally CHEST:  Unremarkable HEART:  PMI not displaced or sustained,S1 and S2 within normal limits, no S3, no S4, no clicks, no rubs, no murmurs ABD:  Flat, positive bowel sounds normal in frequency in pitch, no bruits, no rebound, no guarding, no midline pulsatile mass, no hepatomegaly, no splenomegaly EXT:  2 plus pulses throughout, no edema, no cyanosis no clubbing    EKG:  EKG is ordered today. The ekg ordered today demonstrates sinus rhythm, rate 64, axis within normal limits, intervals within normal limits, no acute ST-T wave changes.   Recent Labs: 06/06/2019: BUN 18; Creatinine, Ser 1.10; Potassium 3.5; Sodium 137; TSH 0.91 06/30/2019: Hemoglobin 13.5; Platelets 390    Lipid Panel    Component Value Date/Time   CHOL 171 05/28/2018 0318   TRIG 307 (H) 05/28/2018 0318   HDL 37 (L) 05/28/2018 0318   CHOLHDL 4.6 05/28/2018 0318   VLDL 61 (H) 05/28/2018 0318   LDLCALC 73 05/28/2018 0318   LDLDIRECT 97.0 05/14/2018 0755      Wt Readings from Last 3 Encounters:  09/22/19 200 lb (90.7 kg)  08/28/19 201 lb 9.6 oz (91.4 kg)  08/04/19 202 lb (91.6 kg)      Other studies Reviewed: Additional studies/ records that were reviewed today include: [facility] recordings. Review of the above records demonstrates:  Please see elsewhere in the note.     ASSESSMENT AND PLAN:  CAD:   Patient has no anginal symptoms.  No change in therapy.  He will continue with risk reduction.  HTN: His blood pressure is well controlled.  No change in  therapy.  SLEEP APNEA: He uses CPAP routinely.  PAF:  Mr. Kaedin Hicklin has a CHA2DS2 - VASc score of 3.   We talked quite a bit about this.  I am going to give him metoprolol 12-1/2 mg as needed to take if he is in this rhythm.  We talked about further therapy  with Tikosyn or potentially Multaq or sotalol.  He would choose not to do this as this was very infrequent.  However, he will let me know if this changes.  COVID EDUCATION: He has not had the vaccine but is thinking about it.  We had a long discussion about this.  Current medicines are reviewed at length with the patient today.  The patient does not have concerns regarding medicines.  The following changes have been made:  no change  Labs/ tests ordered today include:   Orders Placed This Encounter  Procedures  . EKG 12-Lead     Disposition:   FU with me in six month.     Signed, [name], MD  09/22/2019 1:23 PM    [place]

## 2019-09-22 ENCOUNTER — Ambulatory Visit: Payer: PPO | Admitting: Cardiology

## 2019-09-22 ENCOUNTER — Encounter: Payer: Self-pay | Admitting: Cardiology

## 2019-09-22 ENCOUNTER — Other Ambulatory Visit: Payer: Self-pay

## 2019-09-22 VITALS — BP 114/72 | HR 64 | Ht 68.0 in | Wt 200.0 lb

## 2019-09-22 DIAGNOSIS — G4733 Obstructive sleep apnea (adult) (pediatric): Secondary | ICD-10-CM

## 2019-09-22 DIAGNOSIS — I2510 Atherosclerotic heart disease of native coronary artery without angina pectoris: Secondary | ICD-10-CM | POA: Diagnosis not present

## 2019-09-22 DIAGNOSIS — I10 Essential (primary) hypertension: Secondary | ICD-10-CM | POA: Diagnosis not present

## 2019-09-22 MED ORDER — METOPROLOL TARTRATE 25 MG PO TABS
12.5000 mg | ORAL_TABLET | Freq: Three times a day (TID) | ORAL | 3 refills | Status: DC | PRN
Start: 2019-09-22 — End: 2021-02-22

## 2019-09-22 NOTE — Telephone Encounter (Signed)
Routing message to triage for follow up. Called [facility], they are not open yet.

## 2019-09-22 NOTE — Patient Instructions (Signed)
Medication Instructions:  START 12.5MG  OF METOPROLOL EVERY 8 HOURS AS NEEDED FOR HEART RATE *If you need a refill on your cardiac medications before your next appointment, please call your pharmacy*  Lab Work: NONE ORDERED THIS VISIT  Testing/Procedures: NONE ORDERED THIS VISIT  Follow-Up: At [facility], you and your health needs are our priority.  As part of our continuing mission to provide you with exceptional heart care, we have created designated Provider Care Teams.  These Care Teams include your primary Cardiologist (physician) and Advanced Practice Providers (APPs -  Physician Assistants and Nurse Practitioners) who all work together to provide you with the care you need, when you need it.  Your next appointment:   6 month(s) You will receive a reminder letter in the mail two months in advance. If you don't receive a letter, please call our office to schedule the follow-up appointment.  The format for your next appointment:   In Person  Provider:   [name], MD

## 2019-09-23 NOTE — Telephone Encounter (Signed)
There is no such  requirement by insurance. This patient has been maintained on CPAP since 2003. Please send to different DME.  If they reject, we can let patient know that his choices are to go through another sleep study or obtain his machine online And we can provide prescription

## 2019-09-23 NOTE — Telephone Encounter (Signed)
Order placed per Dr. Elsworth Soho

## 2019-09-23 NOTE — Telephone Encounter (Signed)
Please send order to different DME

## 2019-09-23 NOTE — Telephone Encounter (Signed)
Any DME we send the order to is going to need OV notes from before the sleep study done in 2003. This is an Insurance underwriter requirement.

## 2019-09-23 NOTE — Telephone Encounter (Signed)
Spoke with Gabriel Aguilar at [facility]. Was advised that if we do not have OV notes prior to the pt's last sleep study, he would have to start over completely. Pt would need a new sleep study in order for his insurance to pay for the new CPAP machine.  Dr. Elsworth Soho - please advise.  Thanks.

## 2019-11-03 ENCOUNTER — Ambulatory Visit: Payer: Self-pay

## 2019-11-04 NOTE — Chronic Care Management (AMB) (Signed)
Reviewed chart for medication changes ahead of medication coordination call.  09-22-2019- Cardiology- [name], MD- Patient to continue on metoprolol 12.5mg  as needed.   08-28-2019- Pulmonary- Kara Mead, MD- Patient prescribed Nasonex- 1 spray each nare at bedtime.    BP Readings from Last 3 Encounters:  09/22/19 114/72  08/28/19 (!) 160/60  07/24/19 118/60    Lab Results  Component Value Date   HGBA1C 5.7 06/06/2019     Patient obtains medications through Vials  90 Days   Patient is due for next adherence delivery on: 11/05/2019  Called patient and reviewed medications and coordinated delivery.  This delivery to include:  Amlodipine 10mg ; one tab once daily  Atorvastatin 80mg ; one tab every evening  Clopidogrel 75mg ; one tab once a day  Fenofibrate 54mg ; one tab once a day  HCTZ 25mg ; one tab once a day  Xarelto 20mg ; one tab once a day  Mometasone Furoate Nasal Spray (new medication)  Omeprazole 40mg ; one tab once a day   Patient declined the following medications   Metoprolol 25mg , 0.5 tablet as needed - patient takes as needed and states has abundant supply; patient will call when needed   Patient needs refills for   Mometasone Furoate Nasal Spray (new medication)  Omeprazole 40mg ; one tab once a day [facility] instructed to transfer from previous pharmacy.   Confirmed delivery date of 11/05/2019. advised patient that pharmacy will contact them the morning of delivery.    Anson Crofts, PharmD Clinical Pharmacist [facility] at [place] [phone]

## 2019-11-05 DIAGNOSIS — D485 Neoplasm of uncertain behavior of skin: Secondary | ICD-10-CM | POA: Diagnosis not present

## 2019-11-05 DIAGNOSIS — L821 Other seborrheic keratosis: Secondary | ICD-10-CM | POA: Diagnosis not present

## 2019-11-05 DIAGNOSIS — Z85828 Personal history of other malignant neoplasm of skin: Secondary | ICD-10-CM | POA: Diagnosis not present

## 2019-11-05 DIAGNOSIS — L820 Inflamed seborrheic keratosis: Secondary | ICD-10-CM | POA: Diagnosis not present

## 2019-11-05 DIAGNOSIS — L905 Scar conditions and fibrosis of skin: Secondary | ICD-10-CM | POA: Diagnosis not present

## 2019-11-17 DIAGNOSIS — Z20822 Contact with and (suspected) exposure to covid-19: Secondary | ICD-10-CM | POA: Diagnosis not present

## 2019-12-05 ENCOUNTER — Telehealth: Payer: Self-pay | Admitting: Pharmacist

## 2019-12-05 DIAGNOSIS — E785 Hyperlipidemia, unspecified: Secondary | ICD-10-CM

## 2019-12-05 DIAGNOSIS — I10 Essential (primary) hypertension: Secondary | ICD-10-CM

## 2019-12-05 NOTE — Progress Notes (Addendum)
Chronic Care Management Pharmacy Assistant   Name: Gabriel Aguilar  MRN: [id number] DOB: [birth year]  Reason for Encounter: Medication Review   PCP : Laurey Morale, MD  Allergies:  No Known Allergies  Medications: Outpatient Encounter Medications as of 12/05/2019  Medication Sig   amLODipine (NORVASC) 10 MG tablet Take 1 tablet (10 mg total) by mouth daily.   Ascorbic Acid (VITAMIN C) 1000 MG tablet Take 1,000 mg by mouth daily.   atorvastatin (LIPITOR) 80 MG tablet TAKE 1 TABLET BY MOUTH ONCE DAILY AT  6  PM   clopidogrel (PLAVIX) 75 MG tablet Take 1 tablet (75 mg total) by mouth daily with breakfast.   fenofibrate 54 MG tablet Take 1 tablet (54 mg total) by mouth daily.   fluticasone (FLONASE) 50 MCG/ACT nasal spray Place 1 spray into both nostrils at bedtime.   hydrochlorothiazide (HYDRODIURIL) 25 MG tablet Take 1 tablet (25 mg total) by mouth daily.   metoprolol tartrate (LOPRESSOR) 25 MG tablet Take 0.5 tablets (12.5 mg total) by mouth every 8 (eight) hours as needed (ELEVATED HEART RATE).   mometasone (NASONEX) 50 MCG/ACT nasal spray Place 2 sprays into the nose daily.   nitroGLYCERIN (NITROSTAT) 0.4 MG SL tablet Place 1 tablet (0.4 mg total) under the tongue every 5 (five) minutes as needed for chest pain.   OMEGA 3 1000 MG CAPS Take 1 capsule by mouth daily.    omeprazole (PRILOSEC) 40 MG capsule Take 1 capsule by mouth once daily   rivaroxaban (XARELTO) 20 MG TABS tablet Take 1 tablet (20 mg total) by mouth daily with supper.   No facility-administered encounter medications on file as of 12/05/2019.    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   Medication management 09/16/2018   Coronary artery disease involving native coronary artery of native heart without angina pectoris 09/16/2018   Dyslipidemia 09/16/2018   Essential hypertension 09/16/2018   Educated about  COVID-19 virus infection 09/16/2018   Ankle edema, bilateral 07/17/2018   Status post coronary artery stent placement    NSTEMI (non-ST elevated myocardial infarction) ([place])    Elevated troponin 05/28/2018   Prostatitis 07/05/2015   BPH (benign prostatic hyperplasia) 07/01/2015   Dysuria 07/01/2015   PAF (paroxysmal atrial fibrillation) (HCC)    Acute pericarditis    Second degree AV block    Chest pain 05/11/2015   GERD (gastroesophageal reflux disease) 05/11/2015   Persistent lymphocytosis 05/11/2015   Single kidney 05/11/2015   Hyperlipidemia 05/11/2015   Other and unspecified hyperlipidemia 06/02/2013   OSA (obstructive sleep apnea) 03/05/2013    Goals Addressed   None    Reviewed chart for medication changes ahead of medication coordination call.  No OVs, Consults, or hospital visits since last care coordination call.  No medication changes indicated.  BP Readings from Last 3 Encounters:  09/22/19 114/72  08/28/19 (!) 160/60  07/24/19 118/60    Lab Results  Component Value Date   HGBA1C 5.7 06/06/2019     Patient obtains medications through Vials  90 Days   Last adherence delivery included:   Amlodipine 10mg ; one tab once daily  Atorvastatin 80mg ; one tab every evening  Clopidogrel 75mg ; one tab once a day  Fenofibrate 54mg ; one tab once a day  HCTZ 25mg ; one tab once a day  Xarelto 20mg ; one tab once a day  Mometasone Furoate Nasal Spray   Omeprazole 40mg ; one tab once a day  Patient declined Metoprolol 25mg  and Nitroglycerin Sub 0.4mg  last month due to PRN use.  Patient is NOT due for an adherence delivery at this time. Contacted patient to confirm any medication changes. None at this time. Patient stated he is still taking the following medications:  Amlodipine 10mg ; one tab once daily  Atorvastatin 80mg ; one tab every evening  Clopidogrel 75mg ; one tab once a day  Fenofibrate 54mg ; one tab once a day  HCTZ 25mg ; one  tab once a day  Xarelto 20mg ; one tab once a day  Mometasone Furoate Nasal Spray  Omeprazole 40mg ; one tab once a day   Patient does not need refills at this time  Confirmed delivery date of 02-02-2020, advised patient that pharmacy will contact them the morning of delivery.   Follow-Up:  Coordination of [place], [place] Pharmacist Assistant [phone]

## 2019-12-24 DIAGNOSIS — G4733 Obstructive sleep apnea (adult) (pediatric): Secondary | ICD-10-CM | POA: Diagnosis not present

## 2019-12-26 ENCOUNTER — Telehealth: Payer: Self-pay | Admitting: Pharmacist

## 2019-12-26 DIAGNOSIS — I10 Essential (primary) hypertension: Secondary | ICD-10-CM

## 2019-12-26 DIAGNOSIS — E785 Hyperlipidemia, unspecified: Secondary | ICD-10-CM

## 2019-12-26 NOTE — Progress Notes (Addendum)
Chronic Care Management Pharmacy Assistant   Name: Gabriel Aguilar  MRN: [id number] DOB: 1950/12/06  Reason for Encounter: Medication Review  Patient Questions:  1.  Have you seen any other providers since your last visit? No  2.  Any changes in your medicines or health? No  PCP : Laurey Morale, MD  Allergies:  No Known Allergies  Medications: Outpatient Encounter Medications as of 12/26/2019  Medication Sig  . amLODipine (NORVASC) 10 MG tablet Take 1 tablet (10 mg total) by mouth daily.  . Ascorbic Acid (VITAMIN C) 1000 MG tablet Take 1,000 mg by mouth daily.  [name] atorvastatin (LIPITOR) 80 MG tablet TAKE 1 TABLET BY MOUTH ONCE DAILY AT  6  PM  . clopidogrel (PLAVIX) 75 MG tablet Take 1 tablet (75 mg total) by mouth daily with breakfast.  . fenofibrate 54 MG tablet Take 1 tablet (54 mg total) by mouth daily.  . fluticasone (FLONASE) 50 MCG/ACT nasal spray Place 1 spray into both nostrils at bedtime.  . hydrochlorothiazide (HYDRODIURIL) 25 MG tablet Take 1 tablet (25 mg total) by mouth daily.  . metoprolol tartrate (LOPRESSOR) 25 MG tablet Take 0.5 tablets (12.5 mg total) by mouth every 8 (eight) hours as needed (ELEVATED HEART RATE).  . mometasone (NASONEX) 50 MCG/ACT nasal spray Place 2 sprays into the nose daily.  . nitroGLYCERIN (NITROSTAT) 0.4 MG SL tablet Place 1 tablet (0.4 mg total) under the tongue every 5 (five) minutes as needed for chest pain.  [name] OMEGA 3 1000 MG CAPS Take 1 capsule by mouth daily.   [name] omeprazole (PRILOSEC) 40 MG capsule Take 1 capsule by mouth once daily  . rivaroxaban (XARELTO) 20 MG TABS tablet Take 1 tablet (20 mg total) by mouth daily with supper.   No facility-administered encounter medications on file as of 12/26/2019.    Current Diagnosis: Patient Active Problem List   Diagnosis Date Noted  . 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 Medication management 09/16/2018  . Coronary artery disease involving native coronary artery of  native heart without angina pectoris 09/16/2018  . Dyslipidemia 09/16/2018  . Essential hypertension 09/16/2018  . Educated about COVID-19 virus infection 09/16/2018  . Ankle edema, bilateral 07/17/2018  . Status post coronary artery stent placement   . NSTEMI (non-ST elevated myocardial infarction) ([place])   . Elevated troponin 05/28/2018  . Prostatitis 07/05/2015  . BPH (benign prostatic hyperplasia) 07/01/2015  . Dysuria 07/01/2015  . PAF (paroxysmal atrial fibrillation) ([place])   . Acute pericarditis   . Second degree AV block   . Chest pain 05/11/2015  . GERD (gastroesophageal reflux disease) 05/11/2015  . Persistent lymphocytosis 05/11/2015  . Single kidney 05/11/2015  . Hyperlipidemia 05/11/2015  . Other and unspecified hyperlipidemia 06/02/2013  . OSA (obstructive sleep apnea) 03/05/2013    Goals Addressed   None    Reviewed chart for medication changes ahead of medication coordination call.  No OVs, Consults, or hospital visits since last care coordination call/Pharmacist visit.  No medication changes indicated.  BP Readings from Last 3 Encounters:  09/22/19 114/72  08/28/19 (!) 160/60  07/24/19 118/60    Lab Results  Component Value Date   HGBA1C 5.7 06/06/2019     Patient obtains medications through Vials  90 Days  Last adherence delivery included:   Amlodipine 10mg ; one tab once daily  Atorvastatin 80mg ; one tab every evening  Clopidogrel 75mg ; one tab once a day  Fenofibrate 54mg ; one tab once a day  HCTZ 25mg ;  one tab once a day  Xarelto 20mg ; one tab once a day  Mometasone Furoate Nasal Spray  Omeprazole 40mg ; one tab once a day   Patient declined Metoprolol 25mg  and Nitroglycerin Sub 0.4mg  last month due to PRN use. Patient is not due for adherence delivery at this time.  Called patient and reviewed medications. There are no changes in medicaotion at this time. The patient is taking the following medications:  Amlodipine 10mg ; one tab  once daily  Atorvastatin 80mg ; one tab every evening  Clopidogrel 75mg ; one tab once a day  Fenofibrate 54mg ; one tab once a day  HCTZ 25mg ; one tab once a day  Xarelto 20mg ; one tab once a day  Mometasone Furoate Nasal Spray  Omeprazole 40mg ; one tab once a day   Patient does not need refills at this time.  Confirmed delivery date of 02-02-2020, advised patient that pharmacy will contact them the morning of delivery.  Follow-Up:  Coordination of Enhanced Pharmacy Services  [place] ([place]) Mare Ferrari, [place] Pharmacy Assistant [phone]  Jeni Salles, PharmD Clinical Pharmacist [facility] [place] [phone]

## 2020-01-02 DIAGNOSIS — H25013 Cortical age-related cataract, bilateral: Secondary | ICD-10-CM | POA: Diagnosis not present

## 2020-01-02 DIAGNOSIS — H35013 Changes in retinal vascular appearance, bilateral: Secondary | ICD-10-CM | POA: Diagnosis not present

## 2020-01-02 DIAGNOSIS — H2513 Age-related nuclear cataract, bilateral: Secondary | ICD-10-CM | POA: Diagnosis not present

## 2020-01-02 DIAGNOSIS — H35363 Drusen (degenerative) of macula, bilateral: Secondary | ICD-10-CM | POA: Diagnosis not present

## 2020-01-02 DIAGNOSIS — H524 Presbyopia: Secondary | ICD-10-CM | POA: Diagnosis not present

## 2020-01-23 ENCOUNTER — Other Ambulatory Visit: Payer: Self-pay | Admitting: Cardiology

## 2020-01-23 DIAGNOSIS — G4733 Obstructive sleep apnea (adult) (pediatric): Secondary | ICD-10-CM | POA: Diagnosis not present

## 2020-01-23 DIAGNOSIS — Z0000 Encounter for general adult medical examination without abnormal findings: Secondary | ICD-10-CM

## 2020-01-23 NOTE — Telephone Encounter (Signed)
Prescription refill request for Xarelto received.  Indication:  Atrial Fibrillation Last office visit: 09/2019 [place] Weight:90.7 kg Age: 69 Scr: 1.1 05/2019 CrCl: 79.02 ml/min  Prescription refilled

## 2020-01-28 ENCOUNTER — Telehealth: Payer: Self-pay | Admitting: Pharmacist

## 2020-01-28 DIAGNOSIS — E785 Hyperlipidemia, unspecified: Secondary | ICD-10-CM

## 2020-01-28 DIAGNOSIS — I10 Essential (primary) hypertension: Secondary | ICD-10-CM

## 2020-01-28 NOTE — Chronic Care Management (AMB) (Addendum)
Chronic Care Management Pharmacy Assistant   Name: Gabriel Aguilar  MRN: [id number] DOB: 1950/09/11  Reason for Encounter: Medication Review  Patient Questions:  1.  Have you seen any other providers since your last visit? No  2.  Any changes in your medicines or health? No   PCP : Laurey Morale, MD  Allergies:  No Known Allergies  Medications: Outpatient Encounter Medications as of 01/28/2020  Medication Sig  . amLODipine (NORVASC) 10 MG tablet Take 1 tablet (10 mg total) by mouth daily.  . Ascorbic Acid (VITAMIN C) 1000 MG tablet Take 1,000 mg by mouth daily.  [name] atorvastatin (LIPITOR) 80 MG tablet TAKE ONE TABLET BY MOUTH EVERY EVENING AT 6pm  . clopidogrel (PLAVIX) 75 MG tablet Take 1 tablet (75 mg total) by mouth daily with breakfast.  . fenofibrate 54 MG tablet Take 1 tablet (54 mg total) by mouth daily.  . fluticasone (FLONASE) 50 MCG/ACT nasal spray Place 1 spray into both nostrils at bedtime.  . hydrochlorothiazide (HYDRODIURIL) 25 MG tablet TAKE ONE TABLET BY MOUTH ONCE DAILY  . metoprolol tartrate (LOPRESSOR) 25 MG tablet Take 0.5 tablets (12.5 mg total) by mouth every 8 (eight) hours as needed (ELEVATED HEART RATE).  . mometasone (NASONEX) 50 MCG/ACT nasal spray Place 2 sprays into the nose daily.  . nitroGLYCERIN (NITROSTAT) 0.4 MG SL tablet Place 1 tablet (0.4 mg total) under the tongue every 5 (five) minutes as needed for chest pain.  [name] OMEGA 3 1000 MG CAPS Take 1 capsule by mouth daily.   [name] omeprazole (PRILOSEC) 40 MG capsule Take 1 capsule by mouth once daily  . XARELTO 20 MG TABS tablet TAKE ONE TABLET BY MOUTH ONCE DAILY WITH SUPPER   No facility-administered encounter medications on file as of 01/28/2020.    Current Diagnosis: Patient Active Problem List   Diagnosis Date Noted  . 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 Medication management 09/16/2018  . Coronary artery disease involving native coronary artery of native heart without  angina pectoris 09/16/2018  . Dyslipidemia 09/16/2018  . Essential hypertension 09/16/2018  . Educated about COVID-19 virus infection 09/16/2018  . Ankle edema, bilateral 07/17/2018  . Status post coronary artery stent placement   . NSTEMI (non-ST elevated myocardial infarction) ([place])   . Elevated troponin 05/28/2018  . Prostatitis 07/05/2015  . BPH (benign prostatic hyperplasia) 07/01/2015  . Dysuria 07/01/2015  . PAF (paroxysmal atrial fibrillation) ([place])   . Acute pericarditis   . Second degree AV block   . Chest pain 05/11/2015  . GERD (gastroesophageal reflux disease) 05/11/2015  . Persistent lymphocytosis 05/11/2015  . Single kidney 05/11/2015  . Hyperlipidemia 05/11/2015  . Other and unspecified hyperlipidemia 06/02/2013  . OSA (obstructive sleep apnea) 03/05/2013    Goals Addressed   None    Reviewed chart for medication changes ahead of medication coordination call.  No OVs, Consults, or hospital visits since last care coordination call/Pharmacist visit.  No medication changes indicated.  BP Readings from Last 3 Encounters:  09/22/19 114/72  08/28/19 (!) 160/60  07/24/19 118/60    Lab Results  Component Value Date   HGBA1C 5.7 06/06/2019     Patient obtains medications through [facility]  90 Days  Last adherence delivery included:   Amlodipine 10mg ; one tab once daily  Atorvastatin 80mg ; one tab every evening  Clopidogrel 75mg ; one tab once a day  Fenofibrate 54mg ; one tab once a day  HCTZ 25mg ; one tab once a day  Xarelto  20mg ; one tab once a day  Mometasone Furoate Nasal Spray  Omeprazole 40mg ; one tab once a day  Patient is due for next adherence delivery on: 02-02-2020. Called patient and reviewed medications and coordinated delivery.  This delivery to include:  Amlodipine 10mg ; one tab once daily  Atorvastatin 80mg ; one tab every evening  Clopidogrel 75mg ; one tab once a day  Fenofibrate 54mg ; one tab once a day  HCTZ 25mg ; one tab  once a day  Xarelto 20mg ; one tab once a day  Mometasone Furoate Nasal Spray  Omeprazole 40mg ; one tab once a day  Ipratropium bromide nasal spray 0.03% 2 sprays in both nostrils at bedtime   Patient declined Metoprolol 25mg andNitroglycerin Sub 0.4mg due to PRN use. Also has mometasone furoate nasal spray supply on hand.  Patient needs refills for:  Atorvastatin 80mg ; one tab every evening  HCTZ 25mg ; one tab once a day  Xarelto 20mg ; one tab once a day  Confirmed delivery date of 02-02-2020, advised patient that pharmacy will contact them the morning of delivery.  Follow-Up:  Coordination of Enhanced Pharmacy Services   Amilia ([place]) Mare Ferrari, [place] Assistant [phone]

## 2020-01-29 ENCOUNTER — Encounter: Payer: Self-pay | Admitting: Pulmonary Disease

## 2020-01-29 ENCOUNTER — Other Ambulatory Visit: Payer: Self-pay

## 2020-01-29 ENCOUNTER — Ambulatory Visit: Payer: PPO | Admitting: Pulmonary Disease

## 2020-01-29 DIAGNOSIS — G4733 Obstructive sleep apnea (adult) (pediatric): Secondary | ICD-10-CM

## 2020-01-29 DIAGNOSIS — J310 Chronic rhinitis: Secondary | ICD-10-CM | POA: Insufficient documentation

## 2020-01-29 MED ORDER — IPRATROPIUM BROMIDE 0.03 % NA SOLN: 2.0000 | Freq: Every day | NASAL | 12 refills | Status: DC

## 2020-01-29 NOTE — Progress Notes (Signed)
   Subjective:    Patient ID: Gabriel Aguilar, male    DOB: [birth year], 69 y.o.   MRN: [id number]  HPI  69 yo ex-smoker with atrial fibrillation for FU of obstructive sleep apnea. -on autoCPAP  Chief Complaint  Patient presents with  . Follow-up    Pt states he has been doing well since last visit. Pt is wearing the CPAP at night, DME:  [employer]    On his last visit, we got him a new CPAP machine, on auto settings.  This is working very well for him.  He is here for compliance check.  He is settled down with nasal pillows.  Feels rested in the daytime, no sleep pressure.  No problems with mask or pressure.  He complains of nasal congestion all year round.  Last visit I gave him mometasone/Nasonex but this does not seem to have helped.  He has reverted to using oxymetazoline which he uses every night and this seems to help.  OTC decongestants have caused hypertension in the past Denies seasonal variations    Significant tests/ events reviewed  PSG 11/2001 showed AHI 20/h, predom apneas, longest 70 s, corrected by CPAP 8 cm    Past Medical History:  Diagnosis Date  . Arthritis   . Coronary artery disease   . Erectile dysfunction   . GERD (gastroesophageal reflux disease)   . Hiatal hernia   . History of pericarditis   . HTN (hypertension)   . Hyperlipidemia LDL goal <70   . Hypogonadism male    has seen  Dr. Orland Mustard  . Palpitation    a. montior 2 weeks 2014--> no arrhythmia  . Paroxysmal atrial fibrillation ([facility])   . Sleep apnea, central    wears a CPAP at night     Review of Systems neg for any significant sore throat, dysphagia, itching, sneezing, nasal congestion or excess/ purulent secretions, fever, chills, sweats, unintended wt loss, pleuritic or exertional cp, hempoptysis, orthopnea pnd or change in chronic leg swelling. Also denies presyncope, palpitations, heartburn, abdominal pain, nausea, vomiting, diarrhea or change in bowel or urinary habits,  dysuria,hematuria, rash, arthralgias, visual complaints, headache, numbness weakness or ataxia.     Objective:   Physical Exam  Gen. Pleasant, well-nourished, in no distress ENT - no thrush, no pallor/icterus,no post nasal drip , inflamed nasal turbinates Neck: No JVD, no thyromegaly, no carotid bruits Lungs: no use of accessory muscles, no dullness to percussion, clear without rales or rhonchi  Cardiovascular: Rhythm regular, heart sounds  normal, no murmurs or gallops, no peripheral edema Musculoskeletal: No deformities, no cyanosis or clubbing        Assessment & Plan:

## 2020-01-29 NOTE — Patient Instructions (Signed)
  Change auto CPAP settings to 5 to 12 cm  For nasal congestion, we have to try and decrease oxymetazoline usage Trial of Claritin 10/Zyrtec once daily  Prescription for ipratropium nasal spray, each nare at bedtime will be sent to pharmacy  If both of these measures do not work, call us for allergy testing

## 2020-01-29 NOTE — Assessment & Plan Note (Signed)
Change auto CPAP settings to 5 to 12 cm  CPAP download was reviewed which objectively confirms compliance about 7 hours every night, average pressure is 9 to 10 cm on auto settings with minimal leak and good control of events. CPAP is certainly helped improve his daytime somnolence and fatigue   compliance with goal of at least 4-6 hrs every night is the expectation. Advised against medications with sedative side effects Cautioned against driving when sleepy - understanding that sleepiness will vary on a day to day basis

## 2020-01-29 NOTE — Assessment & Plan Note (Signed)
For nasal congestion, we have to try and decrease oxymetazoline usage Trial of Claritin 10/Zyrtec once daily  Prescription for ipratropium nasal spray, each nare at bedtime will be sent to pharmacy  If both of these measures do not work, he will call us for allergy testing - RAST panel

## 2020-01-31 ENCOUNTER — Other Ambulatory Visit: Payer: Self-pay | Admitting: Cardiology

## 2020-02-02 ENCOUNTER — Other Ambulatory Visit: Payer: Self-pay | Admitting: Cardiology

## 2020-02-04 ENCOUNTER — Encounter: Payer: Self-pay | Admitting: Pharmacist

## 2020-02-04 NOTE — Chronic Care Management (AMB) (Signed)
Error

## 2020-02-06 DIAGNOSIS — G4733 Obstructive sleep apnea (adult) (pediatric): Secondary | ICD-10-CM | POA: Diagnosis not present

## 2020-02-09 NOTE — Chronic Care Management (AMB) (Signed)
Chronic Care Management Pharmacy  Name: Gabriel Aguilar  MRN: [id number] DOB: [birth year]  Initial Questions: 1. Have you seen any other providers since your last visit? Yes  2. Any changes in your medicines or health? No   Chief Complaint/ HPI  Gabriel Aguilar,  69 y.o. , male presents for their Follow-Up CCM visit with the clinical pharmacist via telephone due to COVID-19 Pandemic.  PCP : Laurey Morale, MD  Their chronic conditions include: HTN, HLD, Paroxysmal Afib, CAD/NSTEMI, GERD  Office Visits: 06/06/2019- Alysia Penna, MD- Patient presented for office visit for high BP and nasal congestion. Patient recommended to stop Afrin since may be contributing to elevated BP readings. Patient to try Flonase. Patient to try Allergra for PND.   Consult Visit: 01/29/20 Kara Mead, MD (pulmonology): Patient presented for OSA follow up. Prescribed ipratropium bromide and trial of Claritin/Zyrtec for nasal congestion to decrease Afrin use. Continue CPAP use for 4-6 hours per night.  11/05/19 Gloris Manchester PA (dermatology): Patient presented for follow up. Unable to access notes.  09/22/19 [name], MD (Cardiology): Patient presented for OSA and Afib follow up. Discussed further therapy with Tikosyn or Multaq or sotatolol for PRN use. Prescribed metoprolol tartrate 12.5 mg to be used PRN.  08/28/19 Kara Mead, MD (pulmonology): Patient presented for OSA follow up. Patient is compliant with CPAP. Prescribed Nasonex 1 spray in each nare at bedtime. Follow up in 1 year.  08/25/19 Gloris Manchester, PA (dermatology): Patient presented for follow up. Unable to access notes.  07/14/2019- Oncology- Lurline Del, MD- Patient presented for office visit for follow up of  of T-cell lymphocytosis. Plan for treatment if indications present: develops anemia or thrombocytopenia related to bone marrow, 10% body weight loss with no clear reason, severely fatigue, drenching sweats, unexplained pruritis. Patient  to follow up yearly unless indicated problems present.     Medications: Outpatient Encounter Medications as of 02/10/2020  Medication Sig  . amLODipine (NORVASC) 10 MG tablet Take 1 tablet (10 mg total) by mouth daily.  . Ascorbic Acid (VITAMIN C) 1000 MG tablet Take 1,000 mg by mouth daily.  [name] atorvastatin (LIPITOR) 80 MG tablet TAKE ONE TABLET BY MOUTH EVERY EVENING AT 6pm  . clopidogrel (PLAVIX) 75 MG tablet Take 1 tablet (75 mg total) by mouth daily with breakfast.  . fenofibrate 54 MG tablet Take 1 tablet (54 mg total) by mouth daily.  . fluticasone (FLONASE) 50 MCG/ACT nasal spray Place 1 spray into both nostrils at bedtime.  . hydrochlorothiazide (HYDRODIURIL) 25 MG tablet TAKE ONE TABLET BY MOUTH ONCE DAILY  . ipratropium (ATROVENT) 0.03 % nasal spray Place 2 sprays into both nostrils at bedtime.  . metoprolol tartrate (LOPRESSOR) 25 MG tablet Take 0.5 tablets (12.5 mg total) by mouth every 8 (eight) hours as needed (ELEVATED HEART RATE).  . mometasone (NASONEX) 50 MCG/ACT nasal spray Place 2 sprays into the nose daily.  . nitroGLYCERIN (NITROSTAT) 0.4 MG SL tablet Place 1 tablet (0.4 mg total) under the tongue every 5 (five) minutes as needed for chest pain.  [name] OMEGA 3 1000 MG CAPS Take 1 capsule by mouth daily.   [name] omeprazole (PRILOSEC) 40 MG capsule Take 1 capsule by mouth once daily  . XARELTO 20 MG TABS tablet TAKE ONE TABLET BY MOUTH ONCE DAILY WITH SUPPER   No facility-administered encounter medications on file as of 02/10/2020.    Current Diagnosis/Assessment:  Goals Addressed            This Visit's Progress   .  Pharmacy Care Plan       CARE PLAN ENTRY  Current Barriers:  . Chronic Disease Management support, education, and care coordination needs related to Hypertension, Hyperlipidemia, Atrial Fibrillation, Coronary Artery Disease, and Gastroesophageal Reflux Disease   Hypertension . Pharmacist Clinical Goal(s): o Over the next 180 days, patient will work with  PharmD and providers to maintain BP goal <130/80 . Current regimen:   Hydrochlorothiazide 25mg , 1 tablet once daily  Amlodipine 10mg , 1 tablet once daily  . Interventions: . Discussed diet modifications. DASH diet:  following a diet emphasizing fruits and vegetables and low-fat dairy products along with whole grains, fish, poultry, and nuts. Reducing red meats and sugars.  . Patient self care activities - Over the next 180 days, patient will: o Check BP at home weekly, document, and provide at future appointments o Ensure daily salt intake < 2300 mg/day  Hyperlipidemia . Pharmacist Clinical Goal(s): o Over the next 180 days, patient will work with PharmD and providers to achieve LDL goal < 70 . Current regimen:   Atorvastatin 80mg , 1 tablet once daily at 6 pm  Fenofibrate 54mg , 1 tablet once daily  Omega-3 1000mg , 1 capsule once daily . Interventions: . We discussed how a diet high in plant sterols (fruits/vegetables/nuts/whole grains/legumes) may reduce your cholesterol.  Encouraged increasing fiber to a daily intake of 10-25g/day  . Patient self care activities - Over the next 180 days, patient will: o Continue current medication and lifestyle modifications (diet and exercise)  Paroxysmal AFIB . Pharmacist Clinical Goal(s) o Over the next 180 days, patient will continue current medication to prevent blood clots. . Current regimen:  o Xarelto 20mg , 1 tablet once daily  . Interventions: o We discussed:  monitoring for signs and symptoms for bleeding (coughing up blood, prolonged nose bleeds, black, tarry stools). . Patient self care activities - Over the next 180 days, patient will: o Continue current medications.   Coronary artery disease/ NSTEMI . Pharmacist Clinical Goal(s) o Over the next 180 days, patient will work with PharmD and providers and keeping taking current medications.  . Current regimen:   Clopidogrel 75mg , 1 tablet once daily   Nitroglycerin 0.4mg , 1  tablet under tongue as needed for chest pain  . Patient self care activities o Patient will continue current medications.  o Verify expiration date of nitroglycerin and keep up-to-date supply.   GERD . Pharmacist Clinical Goal(s) o Over the next 180 days, patient manage symptoms with medication and avoiding trigger foods/drinks.  . Current regimen:  o Omeprazole 40mg , 1 capsule once daily  . Interventions: o We discussed:  non-pharmacological interventions for acid reflux. Take measures to prevent acid reflux, such as avoiding spicy foods, avoiding caffeine, avoid laying down a few hours after eating, and raising the head of the bed . Patient self care activities o Patient will continue current medications.   Allergic rhinitis . Pharmacist Clinical Goal(s) o Over the next 180 days, patient manage symptoms with medication and avoid allergy triggers.  . Current regimen:  . Ipratropium bromide 0.03% 2 sprays in each nostril at bedtime . Afrin only as needed (works through the night) . Fluticasone 25mcg/act nasal spray, 1 spray into both nostrils at bedtime  . Interventions: o We discussed avoiding allergy triggers o We discussed limiting use of Afrin to 3 days at a time due to risk of rebound congestion and increased blood pressure . Patient self care activities o Patient will continue current medications.  o Patient will try Claritin  or Zyrtec to help with congestion  Medication management . Pharmacist Clinical Goal(s): o Over the next 180 days, patient will work with PharmD and providers to achieve optimal medication adherence . Interventions o Comprehensive medication review performed. o Utilize [facility] pharmacy for medication synchronization, packaging and delivery . Patient self care activities - Over the next 180 days, patient will: o Take medications as prescribed o Report any questions or concerns to PharmD and/or provider(s)  Please see past updates related to this goal  by clicking on the "Past Updates" button in the selected goal         Hypertension  Reports occasional dizziness/ lightheadedness.   BP today is:  <140/90  Office blood pressures are  BP Readings from Last 3 Encounters:  01/29/20 126/72  09/22/19 114/72  08/28/19 (!) 160/60   Patient has failed these meds in the past: none  Patient checks BP at home infrequently  Patient home BP readings are ranging:  11/2  140/62  Patient is controlled:   Hydrochlorothiazide 25mg , 1 tablet once daily  Amlodipine 10mg , 1 tablet once daily   We discussed diet and exercise extensively  -Exercise: goes to the gym every day and uses the treadmill some days and weights most days -Discussed checking blood pressure a few times a month.  Plan Continue current medications and control with diet and exercise.   Hyperlipidemia  LDL goal < 70  Lipid Panel     Component Value Date/Time   CHOL 171 05/28/2018 0318   TRIG 307 (H) 05/28/2018 0318   HDL 37 (L) 05/28/2018 0318   CHOLHDL 4.6 05/28/2018 0318   VLDL 61 (H) 05/28/2018 0318   LDLCALC 73 05/28/2018 0318   LDLDIRECT 97.0 05/14/2018 0755    The ASCVD Risk score (Goff DC Jr., et al., 2013) failed to calculate for the following reasons:   The patient has a prior MI or stroke diagnosis   Patient has failed these meds in past: none   Patient is currently controlled on the following medications:   Atorvastatin 80mg , 1 tablet once daily at 6 pm  Fenofibrate 54mg , 1 tablet once daily  Omega-3 1000mg , 1 capsule once daily   We discussed:  diet and exercise extensively  Plan Recommend lipid panel as patient is overdue. May need to consider the addition of Zetia for LDL lowering. Continue current medications  Paroxysmal AFIB   Patient has failed these meds in past: 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) Patient is currently controlled on no medications.  Anticoagulation:   Xarelto 20mg , 1 tablet once daily   We discussed:   monitoring for signs and symptoms for bleeding (coughing up blood, prolonged nose bleeds, black, tarry stools).  Plan Continue current medications  CAD/NSTEMI  Patient reports not needing nitroglycerin since stent procedure.   Cardiology note on 06/13/2018 stated patient should be on clopidogrel for at least a year.   Patient has failed these meds in past: none   Patient is currently controlled on the following medications:   Clopidogrel 75mg , 1 tablet once daily   Nitroglycerin 0.4mg , 1 tablet under tongue as needed for chest pain   We discussed:  checking nitroglycerin expiration dates and replacing as needed   Plan Continue current medications  Patient to follow up with cardiology office (Dr. [name]).   GERD   Patient reports still taking as needed.   Patient has failed these meds in past: none Patient is currently controlled on the following medications:   Omeprazole 40mg , 1 capsule once  daily   We discussed:  non-pharmacological interventions for acid reflux. Take measures to prevent acid reflux, such as avoiding spicy foods, avoiding caffeine, avoid laying down a few hours after eating, and raising the head of the bed.  Plan Continue current medications.   Allergies/congestion   Patient is currently uncontrolled on the following medications:  . Ipratropium bromide 0.03% 2 sprays in each nostril at bedtime . Afrin only as needed (works through the night) . Fluticasone 73mcg/act nasal spray, 1 spray into both nostrils at bedtime   We discussed:  Discussed limiting use of Afrin to 3 consecutive days due to rebound congestion and increased BP; patient reports ipratropium helps for a limited amount of time and not when laying down but has not tried Claritin or Zyrtec yet  Plan  Continue current medications  OTC/supplements   Patient does not want to stop vitamin C as he reports taking for immune health.   Patient is currently on the following:   Ascorbic acid  (Vitamin C) 1000mg , 1 tablet once daily (recommended to drink plenty water with it)   Plan  Continue current medications.   Vaccines   Reviewed and discussed patient's vaccination history.    Immunization History  Administered Date(s) Administered  . Influenza Split 02/22/2011  . PFIZER SARS-COV-2 Vaccination 11/05/2019, 12/05/2019  . Pneumococcal Conjugate-13 05/13/2018    Plan  Recommended patient receive influenza, Shingles, tetanus, and pneumovax vaccine in office/at pharmacy.   Medication Management   Pt uses [facility] for all medications Uses pill box? No - adherence packaging Pt endorses 100% compliance  We discussed: Discussed benefits of medication synchronization, packaging and delivery as well as enhanced pharmacist oversight with [facility].  Plan  Utilize [facility] pharmacy for medication synchronization, packaging and delivery    Follow up: 6 month phone visit  Jeni Salles, PharmD Clinical Pharmacist [facility] [phone]

## 2020-02-10 ENCOUNTER — Ambulatory Visit: Payer: PPO | Admitting: Pharmacist

## 2020-02-10 DIAGNOSIS — E785 Hyperlipidemia, unspecified: Secondary | ICD-10-CM

## 2020-02-10 DIAGNOSIS — I10 Essential (primary) hypertension: Secondary | ICD-10-CM

## 2020-02-10 NOTE — Patient Instructions (Addendum)
Hi Gabriel Aguilar,  It was lovely to get to meet you over the phone! Keep up the good work with staying active and eating healthy. As we discussed, I would recommended checking your blood pressure a couple times a month a few hours after any caffeine and blood pressure medications to make sure the medications are working properly. Also, the vaccines you should consider getting soon to stay up to date are flu, pneumonia (Pneumovax), shingles (Shingrix), and tetanus.  Feel free to give me a call if you have questions or need anything before our next touch base! Have fun in [place]!  Best, Maddie  Jeni Salles, PharmD Clinical Pharmacist [place] [facility] [place]    Visit Information  Goals Addressed            This Visit's Progress   . Pharmacy Care Plan       CARE PLAN ENTRY  Current Barriers:  . Chronic Disease Management support, education, and care coordination needs related to Hypertension, Hyperlipidemia, Atrial Fibrillation, Coronary Artery Disease, and Gastroesophageal Reflux Disease   Hypertension . Pharmacist Clinical Goal(s): o Over the next 180 days, patient will work with PharmD and providers to maintain BP goal <130/80 . Current regimen:   Hydrochlorothiazide 25mg , 1 tablet once daily  Amlodipine 10mg , 1 tablet once daily  . Interventions: . Discussed diet modifications. DASH diet:  following a diet emphasizing fruits and vegetables and low-fat dairy products along with whole grains, fish, poultry, and nuts. Reducing red meats and sugars.  . Patient self care activities - Over the next 180 days, patient will: o Check BP at home weekly, document, and provide at future appointments o Ensure daily salt intake < 2300 mg/day  Hyperlipidemia . Pharmacist Clinical Goal(s): o Over the next 180 days, patient will work with PharmD and providers to achieve LDL goal < 70 . Current regimen:   Atorvastatin 80mg , 1 tablet once daily at 6 pm  Fenofibrate  54mg , 1 tablet once daily  Omega-3 1000mg , 1 capsule once daily . Interventions: . We discussed how a diet high in plant sterols (fruits/vegetables/nuts/whole grains/legumes) may reduce your cholesterol.  Encouraged increasing fiber to a daily intake of 10-25g/day  . Patient self care activities - Over the next 180 days, patient will: o Continue current medication and lifestyle modifications (diet and exercise)  Paroxysmal AFIB . Pharmacist Clinical Goal(s) o Over the next 180 days, patient will continue current medication to prevent blood clots. . Current regimen:  o Xarelto 20mg , 1 tablet once daily  . Interventions: o We discussed:  monitoring for signs and symptoms for bleeding (coughing up blood, prolonged nose bleeds, black, tarry stools). . Patient self care activities - Over the next 180 days, patient will: o Continue current medications.   Coronary artery disease/ NSTEMI . Pharmacist Clinical Goal(s) o Over the next 180 days, patient will work with PharmD and providers and keeping taking current medications.  . Current regimen:   Clopidogrel 75mg , 1 tablet once daily   Nitroglycerin 0.4mg , 1 tablet under tongue as needed for chest pain  . Patient self care activities o Patient will continue current medications.  o Verify expiration date of nitroglycerin and keep up-to-date supply.   GERD . Pharmacist Clinical Goal(s) o Over the next 180 days, patient manage symptoms with medication and avoiding trigger foods/drinks.  . Current regimen:  o Omeprazole 40mg , 1 capsule once daily  . Interventions: o We discussed:  non-pharmacological interventions for acid reflux. Take measures to prevent acid reflux, such  as avoiding spicy foods, avoiding caffeine, avoid laying down a few hours after eating, and raising the head of the bed . Patient self care activities o Patient will continue current medications.   Allergic rhinitis . Pharmacist Clinical Goal(s) o Over the next 180  days, patient manage symptoms with medication and avoid allergy triggers.  . Current regimen:  . Ipratropium bromide 0.03% 2 sprays in each nostril at bedtime . Afrin only as needed (works through the night) . Fluticasone 59mcg/act nasal spray, 1 spray into both nostrils at bedtime  . Interventions: o We discussed avoiding allergy triggers o We discussed limiting use of Afrin to 3 days at a time due to risk of rebound congestion and increased blood pressure . Patient self care activities o Patient will continue current medications.  o Patient will try Claritin or Zyrtec to help with congestion  Medication management . Pharmacist Clinical Goal(s): o Over the next 180 days, patient will work with PharmD and providers to achieve optimal medication adherence . Interventions o Comprehensive medication review performed. o Utilize [facility] for medication synchronization, packaging and delivery . Patient self care activities - Over the next 180 days, patient will: o Take medications as prescribed o Report any questions or concerns to PharmD and/or provider(s)  Please see past updates related to this goal by clicking on the "Past Updates" button in the selected goal         The patient verbalized understanding of instructions provided today and declined a print copy of patient instruction materials.   Telephone follow up appointment with pharmacy team member scheduled for: 6 months    How to Take Your Blood Pressure Blood pressure is a measurement of how strongly your blood is pressing against the walls of your arteries. Arteries are blood vessels that carry blood from your heart throughout your body. Your health care provider takes your blood pressure at each office visit. You can also take your own blood pressure at home with a blood pressure machine. You may need to take your own blood pressure:  To confirm a diagnosis of high blood pressure (hypertension).  To monitor your  blood pressure over time.  To make sure your blood pressure medicine is working. Supplies needed: To take your blood pressure, you will need a blood pressure machine. You can buy a blood pressure machine, or blood pressure monitor, at most drugstores or online. There are several types of home blood pressure monitors. When choosing one, consider the following:  Choose a monitor that has an arm cuff.  Choose a cuff that wraps snugly around your upper arm. You should be able to fit only one finger between your arm and the cuff.  Do not choose a monitor that measures your blood pressure from your wrist or finger. Your health care provider can suggest a reliable monitor that will meet your needs. How to prepare To get the most accurate reading, avoid the following for 30 minutes before you check your blood pressure:  Drinking caffeine.  Drinking alcohol.  Eating.  Smoking.  Exercising. Five minutes before you check your blood pressure:  Empty your bladder.  Sit quietly without talking in a dining chair, rather than in a soft couch or armchair. How to take your blood pressure To check your blood pressure, follow the instructions in the manual that came with your blood pressure monitor. If you have a digital blood pressure monitor, the instructions may be as follows: 1. Sit up straight. 2. Place your feet  on the floor. Do not cross your ankles or legs. 3. Rest your left arm at the level of your heart on a table or desk or on the arm of a chair. 4. Pull up your shirt sleeve. 5. Wrap the blood pressure cuff around the upper part of your left arm, 1 inch (2.5 cm) above your elbow. It is best to wrap the cuff around bare skin. 6. Fit the cuff snugly around your arm. You should be able to place only one finger between the cuff and your arm. 7. Position the cord inside the groove of your elbow. 8. Press the power button. 9. Sit quietly while the cuff inflates and deflates. 10. Read the  digital reading on the monitor screen and write it down (record it). 11. Wait 2-3 minutes, then repeat the steps, starting at step 1. What does my blood pressure reading mean? A blood pressure reading consists of a higher number over a lower number. Ideally, your blood pressure should be below 120/80. The first ("top") number is called the systolic pressure. It is a measure of the pressure in your arteries as your heart beats. The second ("bottom") number is called the diastolic pressure. It is a measure of the pressure in your arteries as the heart relaxes. Blood pressure is classified into four stages. The following are the stages for adults who do not have a short-term serious illness or a chronic condition. Systolic pressure and diastolic pressure are measured in a unit called mm Hg. Normal  Systolic pressure: below 735.  Diastolic pressure: below 80. Elevated  Systolic pressure: 329-924.  Diastolic pressure: below 80. Hypertension stage 1  Systolic pressure: 268-341.  Diastolic pressure: 96-22. Hypertension stage 2  Systolic pressure: 297 or above.  Diastolic pressure: 90 or above. You can have prehypertension or hypertension even if only the systolic or only the diastolic number in your reading is higher than normal. Follow these instructions at home:  Check your blood pressure as often as recommended by your health care provider.  Take your monitor to the next appointment with your health care provider to make sure: ? That you are using it correctly. ? That it provides accurate readings.  Be sure you understand what your goal blood pressure numbers are.  Tell your health care provider if you are having any side effects from blood pressure medicine. Contact a health care provider if:  Your blood pressure is consistently high. Get help right away if:  Your systolic blood pressure is higher than 180.  Your diastolic blood pressure is higher than 110. This information  is not intended to replace advice given to you by your health care provider. Make sure you discuss any questions you have with your health care provider. Document Revised: 03/09/2017 Document Reviewed: 09/03/2015 Elsevier Patient Education  2020 [employer].

## 2020-02-10 NOTE — Chronic Care Management (AMB) (Signed)
Reviewed chart for medication changes ahead of medication coordination call.  No OVs, Consults, or hospital visits since last care coordination call/Pharmacist visit. (If appropriate, list visit date, provider name)  No medication changes indicated OR if recent visit, treatment plan here.  BP Readings from Last 3 Encounters:  01/29/20 126/72  09/22/19 114/72  08/28/19 (!) 160/60    Lab Results  Component Value Date   HGBA1C 5.7 06/06/2019     Patient obtains medications through Vials  90 Days   Last adherence delivery included:  Amlodipine 10mg ; one tab once daily  Atorvastatin 80mg ; one tab every evening  Clopidogrel 75mg ; one tab once a day  Fenofibrate 54mg ; one tab once a day  HCTZ 25mg ; one tab once a day  Xarelto 20mg ; one tab once a day  Mometasone Furoate Nasal Spray  Omeprazole 40mg ; one tab once a day  Ipratropium bromide nasal spray 0.03% 2 sprays in both nostrils at bedtime  Patient declinedMetoprolol 25mg andNitroglycerin Sub 0.4mg due to PRN use. Also has mometasone furoate nasal spray supply on hand.  Patient is due for next adherence delivery on: 04/30/2020. Called patient and reviewed medications and coordinated delivery.  This delivery to include: No medications needed at this time   Confirmed delivery date of 04/30/2020, advised patient that pharmacy will contact them the morning of delivery.  Jeni Salles, PharmD Clinical Pharmacist [place] at [place]

## 2020-02-11 NOTE — Telephone Encounter (Signed)
-----   Message from Viona Gilmore, [facility] sent at 02/11/2020  4:40 PM EDT ----- Regarding: Colonoscopy Hi!  I spoke with [name] yesterday and he requested for you to put in an order for a colonoscopy. It looks like his last one was in 2014. If you have to wait to see him, I totally understand. I did also put him on your schedule for December as he requested a general follow up and wanted to get some vaccines.  Let me know!  Thanks, Maddie

## 2020-02-23 DIAGNOSIS — G4733 Obstructive sleep apnea (adult) (pediatric): Secondary | ICD-10-CM | POA: Diagnosis not present

## 2020-02-24 ENCOUNTER — Telehealth: Payer: Self-pay | Admitting: Pharmacist

## 2020-02-25 DIAGNOSIS — Z85828 Personal history of other malignant neoplasm of skin: Secondary | ICD-10-CM | POA: Diagnosis not present

## 2020-02-25 DIAGNOSIS — B353 Tinea pedis: Secondary | ICD-10-CM | POA: Diagnosis not present

## 2020-02-25 DIAGNOSIS — L905 Scar conditions and fibrosis of skin: Secondary | ICD-10-CM | POA: Diagnosis not present

## 2020-02-25 DIAGNOSIS — L820 Inflamed seborrheic keratosis: Secondary | ICD-10-CM | POA: Diagnosis not present

## 2020-02-25 DIAGNOSIS — L570 Actinic keratosis: Secondary | ICD-10-CM | POA: Diagnosis not present

## 2020-02-25 DIAGNOSIS — L814 Other melanin hyperpigmentation: Secondary | ICD-10-CM | POA: Diagnosis not present

## 2020-02-25 DIAGNOSIS — D225 Melanocytic nevi of trunk: Secondary | ICD-10-CM | POA: Diagnosis not present

## 2020-02-25 DIAGNOSIS — D485 Neoplasm of uncertain behavior of skin: Secondary | ICD-10-CM | POA: Diagnosis not present

## 2020-02-25 DIAGNOSIS — L300 Nummular dermatitis: Secondary | ICD-10-CM | POA: Diagnosis not present

## 2020-02-25 DIAGNOSIS — L821 Other seborrheic keratosis: Secondary | ICD-10-CM | POA: Diagnosis not present

## 2020-03-16 NOTE — Progress Notes (Signed)
Subjective:   Lin Glazier is a 69 y.o. male who presents for Medicare Annual/Subsequent preventive examination.  Review of Systems    N/A  Cardiac Risk Factors include: advanced age (>8men, >35 women);male gender;hypertension     Objective:    Today's Vitals   03/17/20 1435  BP: 126/64  Pulse: 79  Temp: 98.5 F (36.9 C)  TempSrc: Oral  SpO2: 95%  Weight: 205 lb (93 kg)  Height: 5\' 8"  (1.727 m)   Body mass index is 31.17 kg/m.  Advanced Directives 03/17/2020 05/27/2018 05/13/2018 05/11/2015  Does Patient Have a Medical Advance Directive? No No No No  Would patient like information on creating a medical advance directive? No - Patient declined No - Patient declined Yes (MAU/Ambulatory/Procedural Areas - Information given) No - patient declined information    Current Medications (verified) Outpatient Encounter Medications as of 03/17/2020  Medication Sig  . amLODipine (NORVASC) 10 MG tablet Take 1 tablet (10 mg total) by mouth daily.  . Ascorbic Acid (VITAMIN C) 1000 MG tablet Take 1,000 mg by mouth daily.  [name] atorvastatin (LIPITOR) 80 MG tablet TAKE ONE TABLET BY MOUTH EVERY EVENING AT 6pm  . clopidogrel (PLAVIX) 75 MG tablet Take 1 tablet (75 mg total) by mouth daily with breakfast.  . fenofibrate 54 MG tablet Take 1 tablet (54 mg total) by mouth daily.  . fluticasone (FLONASE) 50 MCG/ACT nasal spray Place 1 spray into both nostrils at bedtime.  . hydrochlorothiazide (HYDRODIURIL) 25 MG tablet TAKE ONE TABLET BY MOUTH ONCE DAILY  . ipratropium (ATROVENT) 0.03 % nasal spray Place 2 sprays into both nostrils at bedtime.  . mometasone (NASONEX) 50 MCG/ACT nasal spray Place 2 sprays into the nose daily.  . OMEGA 3 1000 MG CAPS Take 1 capsule by mouth daily.   [name] omeprazole (PRILOSEC) 40 MG capsule Take 1 capsule by mouth once daily  . XARELTO 20 MG TABS tablet TAKE ONE TABLET BY MOUTH ONCE DAILY WITH SUPPER  . metoprolol tartrate (LOPRESSOR) 25 MG tablet Take 0.5 tablets (12.5  mg total) by mouth every 8 (eight) hours as needed (ELEVATED HEART RATE).  [name] nitroGLYCERIN (NITROSTAT) 0.4 MG SL tablet Place 1 tablet (0.4 mg total) under the tongue every 5 (five) minutes as needed for chest pain. (Patient not taking: Reported on 03/17/2020)   No facility-administered encounter medications on file as of 03/17/2020.    Allergies (verified) Patient has no known allergies.   History: Past Medical History:  Diagnosis Date  . Arthritis   . Coronary artery disease   . Erectile dysfunction   . GERD (gastroesophageal reflux disease)   . Hiatal hernia   . History of pericarditis   . HTN (hypertension)   . Hyperlipidemia LDL goal <70   . Hypogonadism male    has seen  Dr. Orland Mustard  . Palpitation    a. montior 2 weeks 2014--> no arrhythmia  . Paroxysmal atrial fibrillation (HCC)   . Sleep apnea, central    wears a CPAP at night    Past Surgical History:  Procedure Laterality Date  . BACK SURGERY  1994   L4-5  . CARDIAC CATHETERIZATION    . COLONOSCOPY  07-09-12   per Dr. Deatra Ina, adenomatous polyp, repeat in 5 yrs   . CORONARY STENT INTERVENTION N/A 05/29/2018   Procedure: CORONARY STENT INTERVENTION;  Surgeon: Wellington Hampshire, MD;  Location: [facility];  Service: Cardiovascular;  Laterality: N/A;  RCA  . EYE SURGERY     LASIK  .  LEFT HEART CATH AND CORONARY ANGIOGRAPHY N/A 05/29/2018   Procedure: LEFT HEART CATH AND CORONARY ANGIOGRAPHY;  Surgeon: Wellington Hampshire, MD;  Location: [facility];  Service: Cardiovascular;  Laterality: N/A;  . 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 SPINE SURGERY  1994   disc at L4-L5  . VASECTOMY     Family History  Problem Relation Age of Onset  . Hyperlipidemia Father 24       KIDNEY FAILURE  . Heart disease Father   . Hypertension Father   . Kidney disease Father   . Diabetes Father   . Heart disease Mother 23  . Other Sister        LIVER FAILURE  . CVA Brother   . Other  Sister        AT BIRTH  . Heart attack Sister   . CVA Brother   . Other Brother        SEVERAL CABG  . Colon cancer Neg Hx    Social History   Socioeconomic History  . Marital status: Married    Spouse name: Not on file  . Number of children: 2  . Years of education: Not on file  . Highest education level: Not on file  Occupational History  . 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 Smoking status: Former Smoker    Packs/day: 1.50    Start date: 04/10/1964    Quit date: 03/06/1983    Years since quitting: 37.0  . Smokeless tobacco: Never Used  Vaping Use  . Vaping Use: Never used  Substance and Sexual Activity  . Alcohol use: Yes    Alcohol/week: 1.0 standard drink    Types: 1 Glasses of wine per week    Comment: rare  . Drug use: No  . Sexual activity: Not on file  Other Topics Concern  . Not on file  Social History Narrative   05/13/2018: Lives with wife in multi-level home, considering down-sizing in next few years, has two grown children.   Not quite ready to retire.   Loves to travel, planning [place] trip with wife in April   Goes to [place], uses elliptical and [employer]   Social Determinants of Health   Financial Resource Strain: Low Risk   . Difficulty of Paying Living Expenses: Not hard at all  Food Insecurity: No Food Insecurity  . Worried About Charity fundraiser in the Last Year: Never true  . Ran Out of Food in the Last Year: Never true  Transportation Needs: No Transportation Needs  . Lack of Transportation (Medical): No  . Lack of Transportation (Non-Medical): No  Physical Activity: Sufficiently Active  . Days of Exercise per Week: 7 days  . Minutes of Exercise per Session: 40 min  Stress: No Stress Concern Present  . Feeling of Stress : Not at all  Social Connections: Moderately Integrated  . Frequency of Communication with Friends and Family: More than three times a week  . Frequency of Social Gatherings with  Friends and Family: More than three times a week  . Attends Religious Services: More than 4 times per year  . Active Member of Clubs or Organizations: No  . Attends Archivist Meetings: Never  . Marital Status: Married    Tobacco Counseling Counseling given: Not Answered   Clinical Intake:  Pre-visit preparation completed: Yes  Pain : No/denies  pain     Nutritional Risks: None Diabetes: No  How often do you need to have someone help you when you read instructions, pamphlets, or other written materials from your doctor or pharmacy?: 1 - Never What is the last grade level you completed in school?: College  Diabetic?No   Interpreter Needed?: No  Information entered by :: [facility] of Daily Living In your present state of health, do you have any difficulty performing the following activities: 03/17/2020  Hearing? N  Vision? N  Difficulty concentrating or making decisions? N  Walking or climbing stairs? N  Dressing or bathing? N  Doing errands, shopping? N  Preparing Food and eating ? N  Using the Toilet? N  In the past six months, have you accidently leaked urine? N  Do you have problems with loss of bowel control? N  Managing your Medications? N  Managing your Finances? N  Housekeeping or managing your Housekeeping? N  Some recent data might be hidden    Patient Care Team: Laurey Morale, MD as PCP - General (Family Medicine) [name], MD as PCP - Cardiology (Cardiology) Thompson Grayer, MD as PCP - Electrophysiology (Cardiology) Rigoberto Noel, MD as Consulting Physician (Pulmonary Disease) Magrinat, Virgie Dad, MD as Consulting Physician (Oncology) Viona Gilmore, [facility] as Pharmacist (Pharmacist)  Indicate any recent Medical Services you may have received from other than [facility] providers in the past year (date may be approximate).     Assessment:   This is a routine wellness examination for Kaydence.  Hearing/Vision screen  Hearing  Screening   125Hz  250Hz  500Hz  1000Hz  2000Hz  3000Hz  4000Hz  6000Hz  8000Hz   Right ear:           Left ear:           Vision Screening Comments: Patient gets eyes checked every year. Had recently got glasses prescription   Dietary issues and exercise activities discussed: Current Exercise Habits: Home exercise routine, Type of exercise: walking;strength training/weights, Time (Minutes): 45, Frequency (Times/Week): 7, Weekly Exercise (Minutes/Week): 315  Goals    . Patient Stated     Lose 20-30 pounds by next year    . Patient Stated     I will continue to exercise daily 45 minutes-60 minutes     . Pharmacy Care Plan     CARE PLAN ENTRY  Current Barriers:  . Chronic Disease Management support, education, and care coordination needs related to Hypertension, Hyperlipidemia, Atrial Fibrillation, Coronary Artery Disease, and Gastroesophageal Reflux Disease   Hypertension . Pharmacist Clinical Goal(s): o Over the next 180 days, patient will work with PharmD and providers to maintain BP goal <130/80 . Current regimen:   Hydrochlorothiazide 25mg , 1 tablet once daily  Amlodipine 10mg , 1 tablet once daily  . Interventions: . Discussed diet modifications. DASH diet:  following a diet emphasizing fruits and vegetables and low-fat dairy products along with whole grains, fish, poultry, and nuts. Reducing red meats and sugars.  . Patient self care activities - Over the next 180 days, patient will: o Check BP at home weekly, document, and provide at future appointments o Ensure daily salt intake < 2300 mg/day  Hyperlipidemia . Pharmacist Clinical Goal(s): o Over the next 180 days, patient will work with PharmD and providers to achieve LDL goal < 70 . Current regimen:   Atorvastatin 80mg , 1 tablet once daily at 6 pm  Fenofibrate 54mg , 1 tablet once daily  Omega-3 1000mg , 1 capsule once daily . Interventions: . We discussed  how a diet high in plant sterols (fruits/vegetables/nuts/whole  grains/legumes) may reduce your cholesterol.  Encouraged increasing fiber to a daily intake of 10-25g/day  . Patient self care activities - Over the next 180 days, patient will: o Continue current medication and lifestyle modifications (diet and exercise)  Paroxysmal AFIB . Pharmacist Clinical Goal(s) o Over the next 180 days, patient will continue current medication to prevent blood clots. . Current regimen:  o Xarelto 20mg , 1 tablet once daily  . Interventions: o We discussed:  monitoring for signs and symptoms for bleeding (coughing up blood, prolonged nose bleeds, black, tarry stools). . Patient self care activities - Over the next 180 days, patient will: o Continue current medications.   Coronary artery disease/ NSTEMI . Pharmacist Clinical Goal(s) o Over the next 180 days, patient will work with PharmD and providers and keeping taking current medications.  . Current regimen:   Clopidogrel 75mg , 1 tablet once daily   Nitroglycerin 0.4mg , 1 tablet under tongue as needed for chest pain  . Patient self care activities o Patient will continue current medications.  o Verify expiration date of nitroglycerin and keep up-to-date supply.   GERD . Pharmacist Clinical Goal(s) o Over the next 180 days, patient manage symptoms with medication and avoiding trigger foods/drinks.  . Current regimen:  o Omeprazole 40mg , 1 capsule once daily  . Interventions: o We discussed:  non-pharmacological interventions for acid reflux. Take measures to prevent acid reflux, such as avoiding spicy foods, avoiding caffeine, avoid laying down a few hours after eating, and raising the head of the bed . Patient self care activities o Patient will continue current medications.   Allergic rhinitis . Pharmacist Clinical Goal(s) o Over the next 180 days, patient manage symptoms with medication and avoid allergy triggers.  . Current regimen:  . Ipratropium bromide 0.03% 2 sprays in each nostril at  bedtime . Afrin only as needed (works through the night) . Fluticasone 40mcg/act nasal spray, 1 spray into both nostrils at bedtime  . Interventions: o We discussed avoiding allergy triggers o We discussed limiting use of Afrin to 3 days at a time due to risk of rebound congestion and increased blood pressure . Patient self care activities o Patient will continue current medications.  o Patient will try Claritin or Zyrtec to help with congestion  Medication management . Pharmacist Clinical Goal(s): o Over the next 180 days, patient will work with PharmD and providers to achieve optimal medication adherence . Interventions o Comprehensive medication review performed. o Utilize [facility] pharmacy for medication synchronization, packaging and delivery . Patient self care activities - Over the next 180 days, patient will: o Take medications as prescribed o Report any questions or concerns to PharmD and/or provider(s)  Please see past updates related to this goal by clicking on the "Past Updates" button in the selected goal        Depression Screen PHQ 2/9 Scores 03/17/2020 07/24/2019 05/13/2018 05/13/2018 01/27/2015  PHQ - 2 Score 0 0 0 0 0  PHQ- 9 Score 0 - 4 - -    Fall Risk Fall Risk  03/17/2020 05/13/2018 05/13/2018 01/27/2015  Falls in the past year? 0 0 0 No  Number falls in past yr: 0 - - -  Injury with Fall? 0 - - -  Risk for fall due to : No Fall Risks - - -  Follow up Falls evaluation completed;Falls prevention discussed - - -    FALL RISK PREVENTION PERTAINING TO THE HOME:  Any  stairs in or around the home? Yes  If so, are there any without handrails? No  Home free of loose throw rugs in walkways, pet beds, electrical cords, etc? Yes  Adequate lighting in your home to reduce risk of falls? Yes   ASSISTIVE DEVICES UTILIZED TO PREVENT FALLS:  Life alert? No  Use of a cane, walker or w/c? No  Grab bars in the bathroom? No  Shower chair or bench in shower? No  Elevated  toilet seat or a handicapped toilet? No   TIMED UP AND GO:  Was the test performed? Yes .  Length of time to ambulate 10 feet: 4 sec.   Gait steady and fast without use of assistive device  Cognitive Function:  Cognitive screening not indicated based on direct observation.       Immunizations Immunization History  Administered Date(s) Administered  . Influenza Split 02/22/2011  . PFIZER SARS-COV-2 Vaccination 11/05/2019, 12/05/2019  . Pneumococcal Conjugate-13 05/13/2018    TDAP status: Due, Education has been provided regarding the importance of this vaccine. Advised may receive this vaccine at local pharmacy or Health Dept. Aware to provide a copy of the vaccination record if obtained from local pharmacy or Health Dept. Verbalized acceptance and understanding.  Flu Vaccine status: Due, Education has been provided regarding the importance of this vaccine. Advised may receive this vaccine at local pharmacy or Health Dept. Aware to provide a copy of the vaccination record if obtained from local pharmacy or Health Dept. Verbalized acceptance and understanding.  Pneumococcal vaccine status: Due, Education has been provided regarding the importance of this vaccine. Advised may receive this vaccine at local pharmacy or Health Dept. Aware to provide a copy of the vaccination record if obtained from local pharmacy or Health Dept. Verbalized acceptance and understanding.    Covid-19 vaccine status: Completed vaccines  Qualifies for Shingles Vaccine? Yes   Zostavax completed No   Shingrix Completed?: No.    Education has been provided regarding the importance of this vaccine. Patient has been advised to call insurance company to determine out of pocket expense if they have not yet received this vaccine. Advised may also receive vaccine at local pharmacy or Health Dept. Verbalized acceptance and understanding.  Screening Tests Health Maintenance  Topic Date Due  . TETANUS/TDAP  Never done   . COLONOSCOPY  07/09/2017  . PNA vac Low Risk Adult (2 of 2 - PPSV23) 05/14/2019  . INFLUENZA VACCINE  11/09/2019  . COVID-19 Vaccine  Completed  . Hepatitis C Screening  Completed    Health Maintenance  Health Maintenance Due  Topic Date Due  . TETANUS/TDAP  Never done  . COLONOSCOPY  07/09/2017  . PNA vac Low Risk Adult (2 of 2 - PPSV23) 05/14/2019  . INFLUENZA VACCINE  11/09/2019    Colorectal cancer screening: Referral to GI placed 03/16/2020. Pt aware the office will call re: appt.  Lung Cancer Screening: (Low Dose CT Chest recommended if Age 45-80 years, 30 pack-year currently smoking OR have quit w/in 15years.) does not qualify.   Lung Cancer Screening Referral: N/A   Additional Screening:  Hepatitis C Screening: does qualify; Completed 06/30/2019   Vision Screening: Recommended annual ophthalmology exams for early detection of glaucoma and other disorders of the eye. Is the patient up to date with their annual eye exam?  Yes  Who is the provider or what is the name of the office in which the patient attends annual eye exams? [facility]  If pt is  not established with a provider, would they like to be referred to a provider to establish care? No .   Dental Screening: Recommended annual dental exams for proper oral hygiene  Community Resource Referral / Chronic Care Management: CRR required this visit?  No   CCM required this visit?  No      Plan:     I have personally reviewed and noted the following in the patient's chart:   . Medical and social history . Use of alcohol, tobacco or illicit drugs  . Current medications and supplements . Functional ability and status . Nutritional status . Physical activity . Advanced directives . List of other physicians . Hospitalizations, surgeries, and ER visits in previous 12 months . Vitals . Screenings to include cognitive, depression, and falls . Referrals and appointments  In addition, I have reviewed  and discussed with patient certain preventive protocols, quality metrics, and best practice recommendations. A written personalized care plan for preventive services as well as general preventive health recommendations were provided to patient.     Ofilia Neas, LPN   52/11/[id number]   Nurse Notes: None

## 2020-03-17 ENCOUNTER — Other Ambulatory Visit: Payer: Self-pay

## 2020-03-17 ENCOUNTER — Encounter: Payer: Self-pay | Admitting: Family Medicine

## 2020-03-17 ENCOUNTER — Ambulatory Visit (INDEPENDENT_AMBULATORY_CARE_PROVIDER_SITE_OTHER): Payer: PPO | Admitting: Family Medicine

## 2020-03-17 ENCOUNTER — Ambulatory Visit (INDEPENDENT_AMBULATORY_CARE_PROVIDER_SITE_OTHER): Payer: PPO

## 2020-03-17 VITALS — BP 126/64 | HR 79 | Temp 98.5°F | Ht 68.0 in | Wt 205.0 lb

## 2020-03-17 DIAGNOSIS — Z7185 Encounter for immunization safety counseling: Secondary | ICD-10-CM | POA: Diagnosis not present

## 2020-03-17 DIAGNOSIS — Z7184 Encounter for health counseling related to travel: Secondary | ICD-10-CM

## 2020-03-17 DIAGNOSIS — Z23 Encounter for immunization: Secondary | ICD-10-CM

## 2020-03-17 DIAGNOSIS — Z0000 Encounter for general adult medical examination without abnormal findings: Secondary | ICD-10-CM | POA: Diagnosis not present

## 2020-03-17 MED ORDER — CIPROFLOXACIN HCL 500 MG PO TABS: 500.0000 mg | ORAL_TABLET | Freq: Two times a day (BID) | ORAL | 0 refills | Status: DC

## 2020-03-17 NOTE — Patient Instructions (Signed)
[name] , Thank you for taking time to come for your Medicare Wellness Visit. I appreciate your ongoing commitment to your health goals. Please review the following plan we discussed and let me know if I can assist you in the future.   Screening recommendations/referrals: Colonoscopy: Currently due, please let us know if you would like [place] to get you set up Recommended yearly ophthalmology/optometry visit for glaucoma screening and checkup Recommended yearly dental visit for hygiene and checkup  Vaccinations: Influenza vaccine: Currently due, you may receive in our office or at your pharmacy Pneumococcal vaccine: Currently due for you second Pneumonia vaccine you may receive at your next appointment with your PCP  Tdap vaccine: Currently due, you may contact your insurance company to discuss cost or you may await and injury to receive  Shingles vaccine: Currently due for shingrix, if you wish to receive we recommend that you do so at your local pharmacy as it is less expensive     Advanced directives: Advance directive discussed with you today. Even though you declined this today please call our office should you change your mind and we can give you the proper paperwork for you to fill out.   Conditions/risks identified: None   Next appointment: 07/26/2020 @ 8:30 am with the Pharmacist at [facility] 65 Years and Older, Male Preventive care refers to lifestyle choices and visits with your health care provider that can promote health and wellness. What does preventive care include?  A yearly physical exam. This is also called an annual well check.  Dental exams once or twice a year.  Routine eye exams. Ask your health care provider how often you should have your eyes checked.  Personal lifestyle choices, including:  Daily care of your teeth and gums.  Regular physical activity.  Eating a healthy diet.  Avoiding tobacco and drug use.  Limiting  alcohol use.  Practicing safe sex.  Taking low doses of aspirin every day.  Taking vitamin and mineral supplements as recommended by your health care provider. What happens during an annual well check? The services and screenings done by your health care provider during your annual well check will depend on your age, overall health, lifestyle risk factors, and family history of disease. Counseling  Your health care provider may ask you questions about your:  Alcohol use.  Tobacco use.  Drug use.  Emotional well-being.  Home and relationship well-being.  Sexual activity.  Eating habits.  History of falls.  Memory and ability to understand (cognition).  Work and work Statistician. Screening  You may have the following tests or measurements:  Height, weight, and BMI.  Blood pressure.  Lipid and cholesterol levels. These may be checked every 5 years, or more frequently if you are over 71 years old.  Skin check.  Lung cancer screening. You may have this screening every year starting at age 71 if you have a 30-pack-year history of smoking and currently smoke or have quit within the past 15 years.  Fecal occult blood test (FOBT) of the stool. You may have this test every year starting at age 71.  Flexible sigmoidoscopy or colonoscopy. You may have a sigmoidoscopy every 5 years or a colonoscopy every 10 years starting at age 71.  Prostate cancer screening. Recommendations will vary depending on your family history and other risks.  Hepatitis C blood test.  Hepatitis B blood test.  Sexually transmitted disease (STD) testing.  Diabetes screening. This is done by checking  your blood sugar (glucose) after you have not eaten for a while (fasting). You may have this done every 1-3 years.  Abdominal aortic aneurysm (AAA) screening. You may need this if you are a current or former smoker.  Osteoporosis. You may be screened starting at age 71 if you are at high risk. Talk  with your health care provider about your test results, treatment options, and if necessary, the need for more tests. Vaccines  Your health care provider may recommend certain vaccines, such as:  Influenza vaccine. This is recommended every year.  Tetanus, diphtheria, and acellular pertussis (Tdap, Td) vaccine. You may need a Td booster every 10 years.  Zoster vaccine. You may need this after age 71.  Pneumococcal 13-valent conjugate (PCV13) vaccine. One dose is recommended after age 71.  Pneumococcal polysaccharide (PPSV23) vaccine. One dose is recommended after age 71. Talk to your health care provider about which screenings and vaccines you need and how often you need them. This information is not intended to replace advice given to you by your health care provider. Make sure you discuss any questions you have with your health care provider. Document Released: 04/23/2015 Document Revised: 12/15/2015 Document Reviewed: 01/26/2015 Elsevier Interactive Patient Education  2017 [employer] Prevention in the Home Falls can cause injuries. They can happen to people of all ages. There are many things you can do to make your home safe and to help prevent falls. What can I do on the outside of my home?  Regularly fix the edges of walkways and driveways and fix any cracks.  Remove anything that might make you trip as you walk through a door, such as a raised step or threshold.  Trim any bushes or trees on the path to your home.  Use bright outdoor lighting.  Clear any walking paths of anything that might make someone trip, such as rocks or tools.  Regularly check to see if handrails are loose or broken. Make sure that both sides of any steps have handrails.  Any raised decks and porches should have guardrails on the edges.  Have any leaves, snow, or ice cleared regularly.  Use sand or salt on walking paths during winter.  Clean up any spills in your garage right away. This  includes oil or grease spills. What can I do in the bathroom?  Use night lights.  Install grab bars by the toilet and in the tub and shower. Do not use towel bars as grab bars.  Use non-skid mats or decals in the tub or shower.  If you need to sit down in the shower, use a plastic, non-slip stool.  Keep the floor dry. Clean up any water that spills on the floor as soon as it happens.  Remove soap buildup in the tub or shower regularly.  Attach bath mats securely with double-sided non-slip rug tape.  Do not have throw rugs and other things on the floor that can make you trip. What can I do in the bedroom?  Use night lights.  Make sure that you have a light by your bed that is easy to reach.  Do not use any sheets or blankets that are too big for your bed. They should not hang down onto the floor.  Have a firm chair that has side arms. You can use this for support while you get dressed.  Do not have throw rugs and other things on the floor that can make you trip. What can I do  in the kitchen?  Clean up any spills right away.  Avoid walking on wet floors.  Keep items that you use a lot in easy-to-reach places.  If you need to reach something above you, use a strong step stool that has a grab bar.  Keep electrical cords out of the way.  Do not use floor polish or wax that makes floors slippery. If you must use wax, use non-skid floor wax.  Do not have throw rugs and other things on the floor that can make you trip. What can I do with my stairs?  Do not leave any items on the stairs.  Make sure that there are handrails on both sides of the stairs and use them. Fix handrails that are broken or loose. Make sure that handrails are as long as the stairways.  Check any carpeting to make sure that it is firmly attached to the stairs. Fix any carpet that is loose or worn.  Avoid having throw rugs at the top or bottom of the stairs. If you do have throw rugs, attach them to the  floor with carpet tape.  Make sure that you have a light switch at the top of the stairs and the bottom of the stairs. If you do not have them, ask someone to add them for you. What else can I do to help prevent falls?  Wear shoes that:  Do not have high heels.  Have rubber bottoms.  Are comfortable and fit you well.  Are closed at the toe. Do not wear sandals.  If you use a stepladder:  Make sure that it is fully opened. Do not climb a closed stepladder.  Make sure that both sides of the stepladder are locked into place.  Ask someone to hold it for you, if possible.  Clearly mark and make sure that you can see:  Any grab bars or handrails.  First and last steps.  Where the edge of each step is.  Use tools that help you move around (mobility aids) if they are needed. These include:  Canes.  Walkers.  Scooters.  Crutches.  Turn on the lights when you go into a dark area. Replace any light bulbs as soon as they burn out.  Set up your furniture so you have a clear path. Avoid moving your furniture around.  If any of your floors are uneven, fix them.  If there are any pets around you, be aware of where they are.  Review your medicines with your doctor. Some medicines can make you feel dizzy. This can increase your chance of falling. Ask your doctor what other things that you can do to help prevent falls. This information is not intended to replace advice given to you by your health care provider. Make sure you discuss any questions you have with your health care provider. Document Released: 01/21/2009 Document Revised: 09/02/2015 Document Reviewed: 05/01/2014 Elsevier Interactive Patient Education  2017 [employer].

## 2020-03-17 NOTE — Progress Notes (Signed)
   Subjective:    Patient ID: Gabriel Aguilar, male    DOB: [birth year], 69 y.o.   MRN: [id number]  HPI Here for travel advice. He will be leaving on a trip to [place] to visit family in 2 weeks. He asks about any immunizations he may need, and he says he often gets a diarrhea infection when he goes to [place]. He feels good today.    Review of Systems  Constitutional: Negative.   Respiratory: Negative.   Cardiovascular: Negative.        Objective:   Physical Exam Constitutional:      Appearance: Normal appearance.  Cardiovascular:     Rate and Rhythm: Normal rate and regular rhythm.     Pulses: Normal pulses.     Heart sounds: Normal heart sounds.  Pulmonary:     Effort: Pulmonary effort is normal.     Breath sounds: Normal breath sounds.  Neurological:     Mental Status: He is alert.           Assessment & Plan:  Immunization update. He is given a flu shot today. He has had the first 2 Covid vaccines, and I encouraged him to the booster next week. We will also give him a supply of Cipro to take in case he gets a diarrhea illness.  Alysia Penna, MD

## 2020-03-17 NOTE — Progress Notes (Unsigned)
inf

## 2020-03-23 ENCOUNTER — Telehealth: Payer: Self-pay

## 2020-03-23 NOTE — Telephone Encounter (Signed)
Left message for patient to attempt to schedule for 3:40pm today. Left message that when he calls back the spot may no longer be available. Patient is due for his 9mo check up.

## 2020-03-24 ENCOUNTER — Telehealth: Payer: Self-pay | Admitting: Pharmacist

## 2020-03-24 DIAGNOSIS — G4733 Obstructive sleep apnea (adult) (pediatric): Secondary | ICD-10-CM | POA: Diagnosis not present

## 2020-03-24 NOTE — Chronic Care Management (AMB) (Signed)
Chronic Care Management Pharmacy Assistant   Name: Gabriel Aguilar  MRN: [id number] DOB: 02/18/1951  Reason for Encounter: Medication Review  PCP : Laurey Morale, MD  Allergies:  No Known Allergies  Medications: Outpatient Encounter Medications as of 03/24/2020  Medication Sig  . amLODipine (NORVASC) 10 MG tablet Take 1 tablet (10 mg total) by mouth daily.  . Ascorbic Acid (VITAMIN C) 1000 MG tablet Take 1,000 mg by mouth daily.  [name] atorvastatin (LIPITOR) 80 MG tablet TAKE ONE TABLET BY MOUTH EVERY EVENING AT 6pm  . ciprofloxacin (CIPRO) 500 MG tablet Take 1 tablet (500 mg total) by mouth 2 (two) times daily.  . clopidogrel (PLAVIX) 75 MG tablet Take 1 tablet (75 mg total) by mouth daily with breakfast.  . fenofibrate 54 MG tablet Take 1 tablet (54 mg total) by mouth daily.  . fluticasone (FLONASE) 50 MCG/ACT nasal spray Place 1 spray into both nostrils at bedtime.  . hydrochlorothiazide (HYDRODIURIL) 25 MG tablet TAKE ONE TABLET BY MOUTH ONCE DAILY  . ipratropium (ATROVENT) 0.03 % nasal spray Place 2 sprays into both nostrils at bedtime.  . metoprolol tartrate (LOPRESSOR) 25 MG tablet Take 0.5 tablets (12.5 mg total) by mouth every 8 (eight) hours as needed (ELEVATED HEART RATE).  . mometasone (NASONEX) 50 MCG/ACT nasal spray Place 2 sprays into the nose daily.  . nitroGLYCERIN (NITROSTAT) 0.4 MG SL tablet Place 1 tablet (0.4 mg total) under the tongue every 5 (five) minutes as needed for chest pain.  [name] OMEGA 3 1000 MG CAPS Take 1 capsule by mouth daily.   [name] omeprazole (PRILOSEC) 40 MG capsule Take 1 capsule by mouth once daily  . XARELTO 20 MG TABS tablet TAKE ONE TABLET BY MOUTH ONCE DAILY WITH SUPPER   No facility-administered encounter medications on file as of 03/24/2020.    Current Diagnosis: Patient Active Problem List   Diagnosis Date Noted  . Perennial non-allergic rhinitis 01/29/2020  . 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 Medication management  09/16/2018  . Coronary artery disease involving native coronary artery of native heart without angina pectoris 09/16/2018  . Dyslipidemia 09/16/2018  . Essential hypertension 09/16/2018  . Educated about COVID-19 virus infection 09/16/2018  . Ankle edema, bilateral 07/17/2018  . Status post coronary artery stent placement   . NSTEMI (non-ST elevated myocardial infarction) ([place])   . Elevated troponin 05/28/2018  . Prostatitis 07/05/2015  . BPH (benign prostatic hyperplasia) 07/01/2015  . Dysuria 07/01/2015  . PAF (paroxysmal atrial fibrillation) ([place])   . Acute pericarditis   . Second degree AV block   . Chest pain 05/11/2015  . GERD (gastroesophageal reflux disease) 05/11/2015  . Persistent lymphocytosis 05/11/2015  . Single kidney 05/11/2015  . Hyperlipidemia 05/11/2015  . Other and unspecified hyperlipidemia 06/02/2013  . OSA (obstructive sleep apnea) 03/05/2013    Goals Addressed   None    Reviewed chart for medication changes ahead of medication coordination call. Review OVs, Consults, or hospital visits since last care coordination call/Pharmacist visit.  . 03-17-20 Office Visit- Laurey Morale, MD Family Medicine No medication changes indicated OR if recent visit, treatment plan here. . Ciprofloxacin (CIPRO) 500 mg:  Take 1 tablet (500 mg total) by mouth 2 (two) times daily ( 10 day supply)  BP Readings from Last 3 Encounters:  03/17/20 126/64  03/17/20 126/64  01/29/20 126/72    Lab Results  Component Value Date   HGBA1C 5.7 06/06/2019     Patient obtains medications through Vials  90 Days   Last adherence delivery included:   Amlodipine 10 mg; one tab once daily  Atorvastatin 80 mg; one tab every evening  Clopidogrel 75 mg; one tab once a day  Fenofibrate 54 mg; one tab once a day  HCTZ 25 mg; one tab once a day  Xarelto 20 mg; one tab once a day  Mometasone Furoate Nasal Spray  Omeprazole 40 mg; one tab once a day  Ipratropium bromide nasal  spray 0.03% 2 sprays in both nostrils at bedtime  I spoke with the patient and review medications. There are no changes in medications currently. The patient is taking the following medications:  Amlodipine 10 mg; one tab once daily  Atorvastatin 80 mg; one tab every evening  Clopidogrel 75 mg; one tab once a day  Fenofibrate 54 mg; one tab once a day  HCTZ 25 mg; one tab once a day  Xarelto 20 mg; one tab once a day  Mometasone Furoate Nasal Spray  Omeprazole 40 mg; one tab once a day  Ipratropium bromide nasal spray 0.03% 2 sprays in both nostrils at bedtime  He declined the following medication last month due to PRN use: [name] Mometasone Furoate Nasal Spray . Metoprolol 25 mg PRN . Nitroglycerin Sub 0.4 mg PRN  He currently does not need refills  Confirmed delivery date of 05-01-2020, advised patient that pharmacy will contact them the morning of delivery.  Follow-Up:  Coordination of Enhanced Pharmacy Services and Pharmacist Review   Maia Breslow, [place] Assistant [phone]

## 2020-03-29 NOTE — Progress Notes (Signed)
A user error has taken place: encounter opened in error, closed for administrative reasons.

## 2020-03-31 DIAGNOSIS — Z03818 Encounter for observation for suspected exposure to other biological agents ruled out: Secondary | ICD-10-CM | POA: Diagnosis not present

## 2020-03-31 DIAGNOSIS — Z20822 Contact with and (suspected) exposure to covid-19: Secondary | ICD-10-CM | POA: Diagnosis not present

## 2020-04-20 ENCOUNTER — Other Ambulatory Visit: Payer: Self-pay | Admitting: Cardiology

## 2020-04-21 ENCOUNTER — Telehealth: Payer: Self-pay | Admitting: Pharmacist

## 2020-04-21 NOTE — Chronic Care Management (AMB) (Signed)
Chronic Care Management Pharmacy Assistant   Name: Zacharey Jensen  MRN: [id number] DOB: 06-07-1950  Reason for Encounter: Medication Review  PCP : Laurey Morale, MD  Allergies:  No Known Allergies  Medications: Outpatient Encounter Medications as of 04/21/2020  Medication Sig  . amLODipine (NORVASC) 10 MG tablet TAKE ONE TABLET BY MOUTH ONCE DAILY  . Ascorbic Acid (VITAMIN C) 1000 MG tablet Take 1,000 mg by mouth daily.  [name] atorvastatin (LIPITOR) 80 MG tablet TAKE ONE TABLET BY MOUTH EVERY EVENING AT 6pm  . ciprofloxacin (CIPRO) 500 MG tablet Take 1 tablet (500 mg total) by mouth 2 (two) times daily.  . clopidogrel (PLAVIX) 75 MG tablet TAKE ONE TABLET BY MOUTH EVERY MORNING WITH BREAKFAST  . fenofibrate 54 MG tablet Take 1 tablet (54 mg total) by mouth daily.  . fluticasone (FLONASE) 50 MCG/ACT nasal spray Place 1 spray into both nostrils at bedtime.  . hydrochlorothiazide (HYDRODIURIL) 25 MG tablet TAKE ONE TABLET BY MOUTH ONCE DAILY  . ipratropium (ATROVENT) 0.03 % nasal spray Place 2 sprays into both nostrils at bedtime.  . metoprolol tartrate (LOPRESSOR) 25 MG tablet Take 0.5 tablets (12.5 mg total) by mouth every 8 (eight) hours as needed (ELEVATED HEART RATE).  . mometasone (NASONEX) 50 MCG/ACT nasal spray Place 2 sprays into the nose daily.  . nitroGLYCERIN (NITROSTAT) 0.4 MG SL tablet Place 1 tablet (0.4 mg total) under the tongue every 5 (five) minutes as needed for chest pain.  [name] OMEGA 3 1000 MG CAPS Take 1 capsule by mouth daily.   [name] omeprazole (PRILOSEC) 40 MG capsule Take 1 capsule by mouth once daily  . XARELTO 20 MG TABS tablet TAKE ONE TABLET BY MOUTH ONCE DAILY WITH SUPPER   No facility-administered encounter medications on file as of 04/21/2020.    Current Diagnosis: Patient Active Problem List   Diagnosis Date Noted  . Perennial non-allergic rhinitis 01/29/2020  . 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 Medication management 09/16/2018  .  Coronary artery disease involving native coronary artery of native heart without angina pectoris 09/16/2018  . Dyslipidemia 09/16/2018  . Essential hypertension 09/16/2018  . Educated about COVID-19 virus infection 09/16/2018  . Ankle edema, bilateral 07/17/2018  . Status post coronary artery stent placement   . NSTEMI (non-ST elevated myocardial infarction) ([place])   . Elevated troponin 05/28/2018  . Prostatitis 07/05/2015  . BPH (benign prostatic hyperplasia) 07/01/2015  . Dysuria 07/01/2015  . PAF (paroxysmal atrial fibrillation) ([place])   . Acute pericarditis   . Second degree AV block   . Chest pain 05/11/2015  . GERD (gastroesophageal reflux disease) 05/11/2015  . Persistent lymphocytosis 05/11/2015  . Single kidney 05/11/2015  . Hyperlipidemia 05/11/2015  . Other and unspecified hyperlipidemia 06/02/2013  . OSA (obstructive sleep apnea) 03/05/2013    Goals Addressed   None    Reviewed chart for medication changes ahead of medication coordination call. No OVs, Consults, or hospital visits since last care coordination call/Pharmacist visit.  No medication changes indicated.  BP Readings from Last 3 Encounters:  03/17/20 126/64  03/17/20 126/64  01/29/20 126/72    Lab Results  Component Value Date   HGBA1C 5.7 06/06/2019     Patient obtains medications through Vials  90 Days   Last adherence delivery included:  [name] Amlodipine 10 mg; one tab once daily . Atorvastatin 80 mg; one tab every evening . Clopidogrel 75 mg; one tab once a day . Fenofibrate 54 mg; one tab once a  day . HCTZ 25 mg; one tab once a day . Xarelto 20 mg; one tab once a day . Omeprazole 40 mg; one tab once a day . Mometasone Furoate Nasal Spray . Metoprolol 25 mg PRN . Nitroglycerin Sub 0.4 mg PRN  Patient declined the following  last month due to PRN use/additional supply on hand. . Amlodipine 10 mg; one tab once daily . Atorvastatin 80 mg; one tab every evening . Clopidogrel 75 mg; one tab  once a day . Fenofibrate 54 mg; one tab once a day . HCTZ 25 mg; one tab once a day . Xarelto 20 mg; one tab once a day . Omeprazole 40 mg; one tab once a day . Mometasone Furoate Nasal Spray . Metoprolol 25 mg PRN . Nitroglycerin Sub 0.4 mg PRN  Patient is due for next adherence delivery on: 04/29/2020. Called patient and reviewed medications and coordinated delivery. This delivery to include: . Amlodipine 10 mg; one tab once daily . Atorvastatin 80 mg; one tab every evening . Clopidogrel 75 mg; one tab once a day . Fenofibrate 54 mg; one tab once a day . HCTZ 25 mg; one tab once a day . Omeprazole 40 mg; one tab once a day . Ipratropium (ATROVENT) 0.03 % nasal spray: at bedtime . Nitroglycerin Sub 0.4 mg PRN  Patient declined the following medications  due to supply on hand . Xarelto 20 mg; one tab once a day  He currently does not need refills Confirmed delivery date of 04/29/2020, advised patient that pharmacy will contact them the morning of delivery.  Follow-Up:  Coordination of Enhanced Pharmacy Services and Pharmacist Review   Maia Breslow, [place] Assistant [phone]

## 2020-04-24 DIAGNOSIS — G4733 Obstructive sleep apnea (adult) (pediatric): Secondary | ICD-10-CM | POA: Diagnosis not present

## 2020-04-26 ENCOUNTER — Other Ambulatory Visit: Payer: Self-pay | Admitting: Cardiology

## 2020-05-19 ENCOUNTER — Telehealth: Payer: Self-pay | Admitting: Pharmacist

## 2020-05-19 NOTE — Chronic Care Management (AMB) (Signed)
Chronic Care Management Pharmacy Assistant   Name: Gabriel Aguilar  MRN: [id number] DOB: 1951/02/27  Reason for Encounter: Medication Review  PCP : Laurey Morale, MD  Allergies:  No Known Allergies  Medications: Outpatient Encounter Medications as of 05/19/2020  Medication Sig  . amLODipine (NORVASC) 10 MG tablet TAKE ONE TABLET BY MOUTH ONCE DAILY  . Ascorbic Acid (VITAMIN C) 1000 MG tablet Take 1,000 mg by mouth daily.  [name] atorvastatin (LIPITOR) 80 MG tablet TAKE ONE TABLET BY MOUTH EVERY EVENING AT 6pm  . ciprofloxacin (CIPRO) 500 MG tablet Take 1 tablet (500 mg total) by mouth 2 (two) times daily.  . clopidogrel (PLAVIX) 75 MG tablet TAKE ONE TABLET BY MOUTH EVERY MORNING WITH BREAKFAST  . fenofibrate 54 MG tablet Take 1 tablet (54 mg total) by mouth daily.  . fluticasone (FLONASE) 50 MCG/ACT nasal spray Place 1 spray into both nostrils at bedtime.  . hydrochlorothiazide (HYDRODIURIL) 25 MG tablet TAKE ONE TABLET BY MOUTH ONCE DAILY  . ipratropium (ATROVENT) 0.03 % nasal spray Place 2 sprays into both nostrils at bedtime.  . metoprolol tartrate (LOPRESSOR) 25 MG tablet Take 0.5 tablets (12.5 mg total) by mouth every 8 (eight) hours as needed (ELEVATED HEART RATE).  . mometasone (NASONEX) 50 MCG/ACT nasal spray Place 2 sprays into the nose daily.  . nitroGLYCERIN (NITROSTAT) 0.4 MG SL tablet Place 1 tablet (0.4 mg total) under the tongue every 5 (five) minutes as needed for chest pain.  [name] OMEGA 3 1000 MG CAPS Take 1 capsule by mouth daily.   [name] omeprazole (PRILOSEC) 40 MG capsule TAKE ONE CAPSULE BY MOUTH ONCE DAILY  . XARELTO 20 MG TABS tablet TAKE ONE TABLET BY MOUTH ONCE DAILY WITH SUPPER   No facility-administered encounter medications on file as of 05/19/2020.    Current Diagnosis: Patient Active Problem List   Diagnosis Date Noted  . Perennial non-allergic rhinitis 01/29/2020  . 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 Medication management 09/16/2018  .  Coronary artery disease involving native coronary artery of native heart without angina pectoris 09/16/2018  . Dyslipidemia 09/16/2018  . Essential hypertension 09/16/2018  . Educated about COVID-19 virus infection 09/16/2018  . Ankle edema, bilateral 07/17/2018  . Status post coronary artery stent placement   . NSTEMI (non-ST elevated myocardial infarction) ([place])   . Elevated troponin 05/28/2018  . Prostatitis 07/05/2015  . BPH (benign prostatic hyperplasia) 07/01/2015  . Dysuria 07/01/2015  . PAF (paroxysmal atrial fibrillation) ([place])   . Acute pericarditis   . Second degree AV block   . Chest pain 05/11/2015  . GERD (gastroesophageal reflux disease) 05/11/2015  . Persistent lymphocytosis 05/11/2015  . Single kidney 05/11/2015  . Hyperlipidemia 05/11/2015  . Other and unspecified hyperlipidemia 06/02/2013  . OSA (obstructive sleep apnea) 03/05/2013    Goals Addressed   None    Reviewed chart for medication changes ahead of medication coordination call. No OVs, Consults, or hospital visits since last care coordination call/Pharmacist visit.  No medication changes indicated.  BP Readings from Last 3 Encounters:  03/17/20 126/64  03/17/20 126/64  01/29/20 126/72    Lab Results  Component Value Date   HGBA1C 5.7 06/06/2019     Patient obtains medications through Vials  90 Days  Last adherence delivery included:  [name] Amlodipine 10 mg; one tab once daily . Atorvastatin 80 mg; one tab every evening . Clopidogrel 75 mg; one tab once a day . Fenofibrate 54 mg; one tab once a day .  HCTZ 25 mg; one tab once a day . Omeprazole 40 mg; one tab once a day . Ipratropium (ATROVENT) 0.03 % nasal spray: at bedtime . Nitroglycerin Sub 0.4 mg PRN  Patient declined the following medication last month due to additional supply on hand. . Xarelto 20 mg; one tab once a day (patient has additional supply on hand)  I spoke with the patient and review medications. There are no changes in  medications currently. Patient declined these medications this month due to PRN use and additional supply on hand.The patient is taking the following medications: . Amlodipine 10mg ; one tab once daily . Atorvastatin 80mg ; one tab every evening . Clopidogrel 75mg ; one tab once a day . Fenofibrate 54mg ; one tab once a day . HCTZ 25mg ; one tab once a day . Omeprazole 40mg ; one tab once a day . Ipratropium (ATROVENT) 0.03 % nasal spray: at bedtime . Nitroglycerin Sub 0.4mg  PRN . Xarelto 20mg ; one tab once a day   He currently does not need refills. Confirmed delivery date of 07/26/2020, advised patient that pharmacy will contact them the morning of delivery. Follow-Up:  Coordination of Enhanced Pharmacy Services and Pharmacist Review   Maia Breslow, [place] Assistant [phone]

## 2020-05-25 ENCOUNTER — Ambulatory Visit: Payer: PPO | Admitting: Cardiology

## 2020-05-25 DIAGNOSIS — G4733 Obstructive sleep apnea (adult) (pediatric): Secondary | ICD-10-CM | POA: Diagnosis not present

## 2020-06-07 ENCOUNTER — Telehealth: Payer: Self-pay | Admitting: Cardiology

## 2020-06-07 NOTE — Telephone Encounter (Signed)
See telephone note.

## 2020-06-07 NOTE — Telephone Encounter (Signed)
FYI: Pt sent message to scheduling pool to see Dr. [name], he stated:   "Saturday evening 06/05/2020 I realized I must be in AFIB, I couldn't sleep Saturday night Sunday morning. At some point Sunday night Monday morning I returned to normal sinus rhythm."    I scheduled him for 06/11/20 at 11:40am with Dr. [name]

## 2020-06-07 NOTE — Telephone Encounter (Signed)
Spoke with pt, he was seen by EMS and confirmed he was in atrial fib. He had symptoms of racing heart, fatigue, SOB and he was unable to sleep. He drank too much caffeine and wine over the weekend and feels that is what triggered the episode. He feels better today and does report some issues with his CPAP, he was encouraged to contact his CPAP person to help with those concerns. He is aware of the appointm,ent in Friday and will call back with problems prior to that appointment.

## 2020-06-07 NOTE — Telephone Encounter (Signed)
Patient c/o Palpitations:  High priority if patient c/o lightheadedness, shortness of breath, or chest pain  1) How long have you had palpitations/irregular HR/ Afib? Are you having the symptoms now? Saturday evening, no  2) Are you currently experiencing lightheadedness, SOB or CP? No symptoms now, did have some SOB and lightheadedness comes and goes  3) Do you have a history of afib (atrial fibrillation) or irregular heart rhythm? yes  4) Have you checked your BP or HR? (document readings if available): 115/67 this morning  5) Are you experiencing any other symptoms? No   Patient states he went into afib Saturday evening and all day Sunday. He states he could not sleep all night, but is not having any symptoms now. He states he took metoprolol twice.

## 2020-06-09 DIAGNOSIS — G4733 Obstructive sleep apnea (adult) (pediatric): Secondary | ICD-10-CM | POA: Diagnosis not present

## 2020-06-10 NOTE — Progress Notes (Signed)
Cardiology Office Note   Date:  06/11/2020   ID:  Gabriel Aguilar, DOB [birth year], MRN [id number]  PCP:  Laurey Morale, MD  Cardiologist:   [name], MD   Chief Complaint  Patient presents with  . Palpitations      History of Present Illness: Gabriel Aguilar is a 70 y.o. male who presents for follow up of CAD and PAF.   He called on the 28th with an episode of PAF after having too much caffeine.  He said that he was under stress when this occurred.  He had traveled down the [place].  He had a few glasses of wine and wait too much caffeine.  He went into fibrillation.  It was never fast but he did feel it.  He felt weak.  He felt fatigued and short of breath.  At one point in time his friends made him call 911 and they came and checked him out.  He said there were no significant abnormalities but he was in atrial fibrillation.  This is documented on his [facility].  He said it probably lasted for about 2-1/2 days and finally resolved on its own.  He did not want to go to the emergency roo  He said this is the first episode since June.  He is not otherwise had any since then.  He has been limiting his caffeine and his wife has not been letting him drink wine.  He does not feel any of the chest discomfort he had previously with his coronary disease.  He has had no neck or arm discomfort.  He said no shortness of breath, PND or orthopnea.  Past Medical History:  Diagnosis Date  . Arthritis   . Coronary artery disease   . Erectile dysfunction   . GERD (gastroesophageal reflux disease)   . Hiatal hernia   . History of pericarditis   . HTN (hypertension)   . Hyperlipidemia LDL goal <70   . Hypogonadism male    has seen  Dr. Orland Mustard  . Palpitation    a. montior 2 weeks 2014--> no arrhythmia  . Paroxysmal atrial fibrillation ([facility])   . Sleep apnea, central    wears a CPAP at night     Past Surgical History:  Procedure Laterality Date  . BACK SURGERY  1994   L4-5  . CARDIAC  CATHETERIZATION    . COLONOSCOPY  07-09-12   per Dr. Deatra Ina, adenomatous polyp, repeat in 5 yrs   . CORONARY STENT INTERVENTION N/A 05/29/2018   Procedure: CORONARY STENT INTERVENTION;  Surgeon: Wellington Hampshire, MD;  Location: [facility];  Service: Cardiovascular;  Laterality: N/A;  RCA  . EYE SURGERY     LASIK  . LEFT HEART CATH AND CORONARY ANGIOGRAPHY N/A 05/29/2018   Procedure: LEFT HEART CATH AND CORONARY ANGIOGRAPHY;  Surgeon: Wellington Hampshire, MD;  Location: [facility];  Service: Cardiovascular;  Laterality: N/A;  . 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 SPINE SURGERY  1994   disc at L4-L5  . VASECTOMY       Current Outpatient Medications  Medication Sig Dispense Refill  . amLODipine (NORVASC) 10 MG tablet TAKE ONE TABLET BY MOUTH ONCE DAILY 90 tablet 1  . Ascorbic Acid (VITAMIN C) 1000 MG tablet Take 1,000 mg by mouth daily.    [name] atorvastatin (LIPITOR) 80 MG tablet TAKE ONE TABLET BY MOUTH EVERY EVENING AT 6pm 90 tablet  3  . ciprofloxacin (CIPRO) 500 MG tablet Take 1 tablet (500 mg total) by mouth 2 (two) times daily. 20 tablet 0  . clopidogrel (PLAVIX) 75 MG tablet TAKE ONE TABLET BY MOUTH EVERY MORNING WITH BREAKFAST 90 tablet 1  . fenofibrate 54 MG tablet Take 1 tablet (54 mg total) by mouth daily. 90 tablet 3  . fluticasone (FLONASE) 50 MCG/ACT nasal spray Place 1 spray into both nostrils at bedtime.    . hydrochlorothiazide (HYDRODIURIL) 25 MG tablet TAKE ONE TABLET BY MOUTH ONCE DAILY 90 tablet 3  . ipratropium (ATROVENT) 0.03 % nasal spray Place 2 sprays into both nostrils at bedtime. 30 mL 12  . metoprolol tartrate (LOPRESSOR) 25 MG tablet Take 0.5 tablets (12.5 mg total) by mouth every 8 (eight) hours as needed (ELEVATED HEART RATE). 45 tablet 3  . mometasone (NASONEX) 50 MCG/ACT nasal spray Place 2 sprays into the nose daily. 17 g 11  . nitroGLYCERIN (NITROSTAT) 0.4 MG SL tablet Place 1 tablet (0.4 mg total) under the  tongue every 5 (five) minutes as needed for chest pain. 25 tablet 1  . OMEGA 3 1000 MG CAPS Take 1 capsule by mouth daily.     [name] omeprazole (PRILOSEC) 40 MG capsule TAKE ONE CAPSULE BY MOUTH ONCE DAILY 90 capsule 1  . XARELTO 20 MG TABS tablet TAKE ONE TABLET BY MOUTH ONCE DAILY WITH SUPPER 90 tablet 1   No current facility-administered medications for this visit.    Allergies:   Patient has no known allergies.    ROS:  Please see the history of present illness.   Otherwise, review of systems are positive for none.   All other systems are reviewed and negative.    PHYSICAL EXAM: VS:  BP (!) 148/72   Pulse 72   Ht 5\' 8"  (1.727 m)   Wt 210 lb 12.8 oz (95.6 kg)   SpO2 94%   BMI 32.05 kg/m  , BMI Body mass index is 32.05 kg/m. GENERAL:  Well appearing NECK:  No jugular venous distention, waveform within normal limits, carotid upstroke brisk and symmetric, no bruits, no thyromegaly LUNGS:  Clear to auscultation bilaterally CHEST:  Unremarkable HEART:  PMI not displaced or sustained,S1 and S2 within normal limits, no S3, no S4, no clicks, no rubs, no murmurs ABD:  Flat, positive bowel sounds normal in frequency in pitch, no bruits, no rebound, no guarding, no midline pulsatile mass, no hepatomegaly, no splenomegaly EXT:  2 plus pulses throughout, no edema, no cyanosis no clubbing    EKG:  EKG is  ordered today. The ekg ordered today demonstrates sinus rhythm, rate 72, axis within normal limits, intervals within normal limits, no acute ST-T wave changes.   Recent Labs: 06/30/2019: Hemoglobin 13.5; Platelets 390    Lipid Panel    Component Value Date/Time   CHOL 171 05/28/2018 0318   TRIG 307 (H) 05/28/2018 0318   HDL 37 (L) 05/28/2018 0318   CHOLHDL 4.6 05/28/2018 0318   VLDL 61 (H) 05/28/2018 0318   LDLCALC 73 05/28/2018 0318   LDLDIRECT 97.0 05/14/2018 0755      Wt Readings from Last 3 Encounters:  06/11/20 210 lb 12.8 oz (95.6 kg)  03/17/20 205 lb (93 kg)  03/17/20  205 lb (93 kg)      Other studies Reviewed: Additional studies/ records that were reviewed today include: [place]. Review of the above records demonstrates:  Please see elsewhere in the note.     ASSESSMENT AND PLAN:  CAD:  The patient has no new sypmtoms.  No further cardiovascular testing is indicated.  We will continue with aggressive risk reduction and meds as listed.  HTN: His blood pressure is well controlled.  No change in therapy.   SLEEP APNEA: He uses a CPAP no change in therapy.   PAF:  Mr. Oland Arquette has a CHA2DS2 - VASc score of 3.  We again had a long discussion about this.  He would like to avoid antiarrhythmic therapy.  I did go back and look at a previous consultation with Dr.Allred in 2018 and I agree that we are still at the same spot where this is not particularly symptomatic.  If in the future has increasing frequency I could refer him back to Dr. Rayann Heman to discuss antiarrhythmics versus ablation.  For now we think there are triggers he can avoid.    Current medicines are reviewed at length with the patient today.  None  The following changes have been made:   None  Labs/ tests ordered today include: None  Orders Placed This Encounter  Procedures  . EKG 12-Lead     Disposition:   FU with me in 12 months or sooner if needed   Signed, [name], MD  06/11/2020 1:03 PM    [place]

## 2020-06-11 ENCOUNTER — Other Ambulatory Visit: Payer: Self-pay

## 2020-06-11 ENCOUNTER — Ambulatory Visit: Payer: PPO | Admitting: Cardiology

## 2020-06-11 ENCOUNTER — Encounter: Payer: Self-pay | Admitting: Cardiology

## 2020-06-11 VITALS — BP 148/72 | HR 72 | Ht 68.0 in | Wt 210.8 lb

## 2020-06-11 DIAGNOSIS — I10 Essential (primary) hypertension: Secondary | ICD-10-CM | POA: Diagnosis not present

## 2020-06-11 DIAGNOSIS — I480 Paroxysmal atrial fibrillation: Secondary | ICD-10-CM

## 2020-06-11 NOTE — Patient Instructions (Signed)
Medication Instructions:  No changes *If you need a refill on your cardiac medications before your next appointment, please call your pharmacy*  Follow-Up: At [facility], you and your health needs are our priority.  As part of our continuing mission to provide you with exceptional heart care, we have created designated Provider Care Teams.  These Care Teams include your primary Cardiologist (physician) and Advanced Practice Providers (APPs -  Physician Assistants and Nurse Practitioners) who all work together to provide you with the care you need, when you need it.  Your next appointment:   12 month(s) You will receive a reminder letter in the mail two months in advance. If you don't receive a letter, please call our office to schedule the follow-up appointment.  The format for your next appointment:   In Person  Provider:   James Hochrein, MD   

## 2020-06-15 ENCOUNTER — Ambulatory Visit: Payer: PPO | Admitting: Cardiology

## 2020-06-21 ENCOUNTER — Telehealth: Payer: Self-pay | Admitting: Pharmacist

## 2020-06-21 NOTE — Chronic Care Management (AMB) (Signed)
Chronic Care Management Pharmacy Assistant   Name: Gabriel Aguilar  MRN: [id number] DOB: 04-07-1951  Reason for Encounter: Medication Review  Recent office visits:  None  Recent consult visits:   03.04.2022 [name], MD Cardiology  Hospital visits:  None in previous 6 months  Medications: Outpatient Encounter Medications as of 06/21/2020  Medication Sig   amLODipine (NORVASC) 10 MG tablet TAKE ONE TABLET BY MOUTH ONCE DAILY   Ascorbic Acid (VITAMIN C) 1000 MG tablet Take 1,000 mg by mouth daily.   atorvastatin (LIPITOR) 80 MG tablet TAKE ONE TABLET BY MOUTH EVERY EVENING AT 6pm   ciprofloxacin (CIPRO) 500 MG tablet Take 1 tablet (500 mg total) by mouth 2 (two) times daily.   clopidogrel (PLAVIX) 75 MG tablet TAKE ONE TABLET BY MOUTH EVERY MORNING WITH BREAKFAST   fenofibrate 54 MG tablet Take 1 tablet (54 mg total) by mouth daily.   fluticasone (FLONASE) 50 MCG/ACT nasal spray Place 1 spray into both nostrils at bedtime.   hydrochlorothiazide (HYDRODIURIL) 25 MG tablet TAKE ONE TABLET BY MOUTH ONCE DAILY   ipratropium (ATROVENT) 0.03 % nasal spray Place 2 sprays into both nostrils at bedtime.   metoprolol tartrate (LOPRESSOR) 25 MG tablet Take 0.5 tablets (12.5 mg total) by mouth every 8 (eight) hours as needed (ELEVATED HEART RATE).   mometasone (NASONEX) 50 MCG/ACT nasal spray Place 2 sprays into the nose daily.   nitroGLYCERIN (NITROSTAT) 0.4 MG SL tablet Place 1 tablet (0.4 mg total) under the tongue every 5 (five) minutes as needed for chest pain.   OMEGA 3 1000 MG CAPS Take 1 capsule by mouth daily.    omeprazole (PRILOSEC) 40 MG capsule TAKE ONE CAPSULE BY MOUTH ONCE DAILY   XARELTO 20 MG TABS tablet TAKE ONE TABLET BY MOUTH ONCE DAILY WITH SUPPER   No facility-administered encounter medications on file as of 06/21/2020.   Reviewed chart for medication changes ahead of medication coordination call. No medication changes indicated.  BP Readings  from Last 3 Encounters:  06/11/20 (!) 148/72  03/17/20 126/64  03/17/20 126/64    Lab Results  Component Value Date   HGBA1C 5.7 06/06/2019   Patient obtains medications through Vials  90 Days  Last adherence delivery included:   Amlodipine 10 mg; one tab once daily  Atorvastatin 80 mg; one tab every evening  Clopidogrel 75 mg; one tab once a day  Fenofibrate 54 mg; one tab once a day  HCTZ 25 mg; one tab once a day  Omeprazole 40 mg; one tab once a day  Ipratropium (ATROVENT) 0.03 % nasal spray: at bedtime  Nitroglycerin Sub 0.4 mg PRN  Xarelto 20 mg; one tab once a day   I spoke with the patient and review medications. There are no changes in medications currently. Patient declined these medication this month due to PRN use/additional supply on hand.The patient is taking the following medications: Last filled on 01.17.2022 for 90 days  Amlodipine 10 mg; one tab once daily  Atorvastatin 80 mg; one tab every evening  Clopidogrel 75 mg; one tab once a day  Fenofibrate 54 mg; one tab once a day  HCTZ 25 mg; one tab once a day  Omeprazole 40 mg; one tab once a day  Ipratropium (ATROVENT) 0.03 % nasal spray: at bedtime  Nitroglycerin Sub 0.4 mg PRN  Xarelto 20 mg; one tab once a day   He currently does not need refills  Star Rating Drugs:  Dispensed Quantity Pharmacy  Amlodipine 10  mg 01.17.2022 [facility]  Atorvastatin 80 mg 01.17.2022 [facility]   Gabriel Aguilar, [place] Assistant [phone]

## 2020-06-22 DIAGNOSIS — G4733 Obstructive sleep apnea (adult) (pediatric): Secondary | ICD-10-CM | POA: Diagnosis not present

## 2020-07-02 ENCOUNTER — Telehealth: Payer: Self-pay

## 2020-07-02 ENCOUNTER — Ambulatory Visit: Payer: PPO | Admitting: Gastroenterology

## 2020-07-02 ENCOUNTER — Encounter: Payer: Self-pay | Admitting: Gastroenterology

## 2020-07-02 ENCOUNTER — Other Ambulatory Visit: Payer: Self-pay

## 2020-07-02 VITALS — BP 140/60 | HR 78 | Ht 68.0 in | Wt 208.0 lb

## 2020-07-02 DIAGNOSIS — Z7901 Long term (current) use of anticoagulants: Secondary | ICD-10-CM | POA: Diagnosis not present

## 2020-07-02 DIAGNOSIS — Z7902 Long term (current) use of antithrombotics/antiplatelets: Secondary | ICD-10-CM | POA: Diagnosis not present

## 2020-07-02 DIAGNOSIS — Z86010 Personal history of colonic polyps: Secondary | ICD-10-CM | POA: Diagnosis not present

## 2020-07-02 MED ORDER — PLENVU 140 G PO SOLR: ORAL | 0 refills | Status: DC

## 2020-07-02 NOTE — Progress Notes (Signed)
07/02/2020 Barbara Keng [id number] [birth year]   HISTORY OF PRESENT ILLNESS: This is a pleasant 70 year old male who is previously a patient of Dr. Kelby Fam.  His care will be assumed Dr. Rush Landmark.  He had a colonoscopy in April 2014 at which time he was found to have two polyps that were removed and were tubular adenomas on pathology.  He was supposed to have a repeat in 5 years.  He is here today to schedule.  He denies an issues with moving his bowels.  No rectal bleeding.  He reports that sometimes difficult to keep his perianal area clean and he attributes that to hemorrhoids.  He describes randomly on rare occasion getting a gas pressure in the center of his chest.  This actually occurred around 2 AM in the middle of the night last night.  It is relieved by drinking water.  He admits that he ate an orange late before bed last night.  He takes omeprazole 40 mg daily and says that that controls his acid reflux symptoms well otherwise.    Past Medical History:  Diagnosis Date  . Arthritis   . Coronary artery disease   . Erectile dysfunction   . GERD (gastroesophageal reflux disease)   . Hiatal hernia   . History of pericarditis   . HTN (hypertension)   . Hyperlipidemia LDL goal <70   . Hypogonadism male    has seen  Dr. Orland Mustard  . Palpitation    a. montior 2 weeks 2014--> no arrhythmia  . Paroxysmal atrial fibrillation ([facility])   . Sleep apnea, central    wears a CPAP at night    Past Surgical History:  Procedure Laterality Date  . BACK SURGERY  1994   L4-5  . CARDIAC CATHETERIZATION    . COLONOSCOPY  07-09-12   per Dr. Deatra Ina, adenomatous polyp, repeat in 5 yrs   . CORONARY STENT INTERVENTION N/A 05/29/2018   Procedure: CORONARY STENT INTERVENTION;  Surgeon: Wellington Hampshire, MD;  Location: [facility];  Service: Cardiovascular;  Laterality: N/A;  RCA  . EYE SURGERY     LASIK  . LEFT HEART CATH AND CORONARY ANGIOGRAPHY N/A 05/29/2018   Procedure: LEFT HEART  CATH AND CORONARY ANGIOGRAPHY;  Surgeon: Wellington Hampshire, MD;  Location: [facility];  Service: Cardiovascular;  Laterality: N/A;  . 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 SPINE SURGERY  1994   disc at L4-L5  . VASECTOMY      reports that he quit smoking about 37 years ago. He started smoking about 56 years ago. He smoked 1.50 packs per day. He has never used smokeless tobacco. He reports current alcohol use of about 1.0 standard drink of alcohol per week. He reports that he does not use drugs. family history includes CVA in his brother and brother; Diabetes in his father; Heart attack in his sister; Heart disease in his father; Heart disease (age of onset: 79) in his mother; Hyperlipidemia (age of onset: 25) in his father; Hypertension in his father; Kidney disease in his father; Other in his brother, sister, and sister. No Known Allergies    Outpatient Encounter Medications as of 07/02/2020  Medication Sig  . amLODipine (NORVASC) 10 MG tablet TAKE ONE TABLET BY MOUTH ONCE DAILY  . Ascorbic Acid (VITAMIN C) 1000 MG tablet Take 1,000 mg by mouth daily.  [name] atorvastatin (LIPITOR) 80 MG tablet TAKE ONE TABLET BY MOUTH  EVERY EVENING AT 6pm  . clopidogrel (PLAVIX) 75 MG tablet TAKE ONE TABLET BY MOUTH EVERY MORNING WITH BREAKFAST  . fenofibrate 54 MG tablet Take 1 tablet (54 mg total) by mouth daily.  . fluticasone (FLONASE) 50 MCG/ACT nasal spray Place 1 spray into both nostrils at bedtime.  . hydrochlorothiazide (HYDRODIURIL) 25 MG tablet TAKE ONE TABLET BY MOUTH ONCE DAILY  . ipratropium (ATROVENT) 0.03 % nasal spray Place 2 sprays into both nostrils at bedtime.  . mometasone (NASONEX) 50 MCG/ACT nasal spray Place 2 sprays into the nose daily.  . nitroGLYCERIN (NITROSTAT) 0.4 MG SL tablet Place 1 tablet (0.4 mg total) under the tongue every 5 (five) minutes as needed for chest pain.  [name] OMEGA 3 1000 MG CAPS Take 1 capsule by mouth daily.   [name] omeprazole  (PRILOSEC) 40 MG capsule TAKE ONE CAPSULE BY MOUTH ONCE DAILY  . XARELTO 20 MG TABS tablet TAKE ONE TABLET BY MOUTH ONCE DAILY WITH SUPPER  . metoprolol tartrate (LOPRESSOR) 25 MG tablet Take 0.5 tablets (12.5 mg total) by mouth every 8 (eight) hours as needed (ELEVATED HEART RATE).  . [DISCONTINUED] ciprofloxacin (CIPRO) 500 MG tablet Take 1 tablet (500 mg total) by mouth 2 (two) times daily.   No facility-administered encounter medications on file as of 07/02/2020.     REVIEW OF SYSTEMS  : All other systems reviewed and negative except where noted in the History of Present Illness.   PHYSICAL EXAM: BP 140/60   Pulse 78   Ht 5\' 8"  (1.727 m)   Wt 208 lb (94.3 kg)   BMI 31.63 kg/m  General: Well developed white male in no acute distress Head: Normocephalic and atraumatic Eyes:  Sclerae anicteric, conjunctiva pink. Ears: Normal auditory acuity Lungs: Clear throughout to auscultation; no W/R/R. Heart: Regular rate and rhythm; no M/R/G. Abdomen: Soft, non-distended.  BS present.  Non-tender. Rectal:  Will be done at the time of colonoscopy. Musculoskeletal: Symmetrical with no gross deformities  Skin: No lesions on visible extremities Extremities: No edema  Neurological: Alert oriented x 4, grossly non-focal Psychological:  Alert and cooperative. Normal mood and affect  ASSESSMENT AND PLAN: *Personal history of colon polyps: Adenomatous colon polyps in 2014.  Was supposed to have a repeat in 2019.  We will schedule Dr. Rush Landmark. *Chronic anticoagulation with Xarelto for history of Afib *Antiplatelet use with Plavix for history of CAD  **Both of his blood thinners are prescribed by his cardiologist, Dr. [name].  We will touch base with him regarding holding Xarelto for 24 hours on Plavix for 5 days prior to his procedures.  Patient is aware of the rare, but potential risks of being off of these medications.   CC:  Laurey Morale, MD

## 2020-07-02 NOTE — Telephone Encounter (Signed)
Patient with diagnosis of atrial fibrillation on Xarelto for anticoagulation.    Procedure: endoscopy procedure Date of procedure: 08/26/20   CHA2DS2-VASc Score =    This indicates a  % annual risk of stroke. The patient's score is based upon: CHF History: No HTN History: Yes Diabetes History: No Age Score: 1 Gender Score: 0       CrCl 82.2 Platelet count 390  Per office protocol, patient can hold Xarelto for 1 days prior to procedure.   Patient will not need bridging with Lovenox (enoxaparin) around procedure.  I

## 2020-07-02 NOTE — Telephone Encounter (Signed)
Dr. [name] please review if okay to hold Plavix for 5 days prior to GI procedure.  Gabriel Aguilar was recently seen on 06/11/2020 at which time he was doing well without any chest pain.  He has a history of CAD and PAF.  Last cardiac catheterization was performed on 05/29/2018 at which time he was noted to have a 95% mid left circumflex lesion treated with Synergy 2.5 x 20 mm DES, 99% mid RCA lesion treated with Synergy 2.75 x 20 mm DES, he had a 40% mid LAD, 20% proximal LAD, 20% proximal RCA residual that was managed medically.  EF was 55 to 65% the time.  PCI was >1 year ago.  Dr. [name], please forward your response to [facility] PREOP

## 2020-07-02 NOTE — Telephone Encounter (Signed)
[facility] Pre-operative Risk Assessment     Request for surgical clearance:     Endoscopy Procedure  What type of surgery is being performed?     Colonoscopy  When is this surgery scheduled?     08/26/20  What type of clearance is required ?   Pharmacy  Are there any medications that need to be held prior to surgery and how long? Plavix 5 days and Xarelto 24 hours before procedure  Practice name and name of physician performing surgery?      [facility]  What is your office phone and fax number?      Phone- [phone]  [phone]  Anesthesia type (None, local, MAC, general) ?       MAC

## 2020-07-02 NOTE — Telephone Encounter (Signed)
I have informed the patient to hold Xarelto for 1 day and to hold Plavix for 5 days prior to the procedure.

## 2020-07-02 NOTE — Patient Instructions (Signed)
If you are age 72 or older, your body mass index should be between 23-30. Your Body mass index is 31.63 kg/m. If this is out of the aforementioned range listed, please consider follow up with your Primary Care Provider.  If you are age 80 or younger, your body mass index should be between 19-25. Your Body mass index is 31.63 kg/m. If this is out of the aformentioned range listed, please consider follow up with your Primary Care Provider.   You have been scheduled for a colonoscopy. Please follow written instructions given to you at your visit today.  Please pick up your prep supplies at the pharmacy within the next 1-3 days. If you use inhalers (even only as needed), please bring them with you on the day of your procedure.  Thank you for choosing me and [facility].  Alonza Bogus, PA-C

## 2020-07-02 NOTE — Telephone Encounter (Signed)
OK to hold Plavix for the procedure and then resume.

## 2020-07-02 NOTE — Telephone Encounter (Signed)
Clinical pharmacist to review Xarelto 

## 2020-07-06 NOTE — Progress Notes (Signed)
Attending 34 Attestation   I have taken an interval history, reviewed the chart and examined the patient.   I agree with the Advanced Practitioner's note, impression, and recommendations with updates and my documentation above.   Justice Britain, MD [facility] Advanced Endoscopy Office # [phone]

## 2020-07-11 NOTE — Progress Notes (Addendum)
Gabriel Aguilar  [phone] [phone]    ID: Gabriel Aguilar DOB: 07/01/1950  MR#: [id number]  UXN#:[id number]  Patient Care Team: Laurey Morale, MD as PCP - General (Family Medicine) [name], MD as PCP - Cardiology (Cardiology) Thompson Grayer, MD as PCP - Electrophysiology (Cardiology) Rigoberto Noel, MD as Consulting Physician (Pulmonary Disease) Radley Barto, Virgie Dad, MD as Consulting Physician (Oncology) Viona Gilmore, [facility] as Pharmacist (Pharmacist) OTHER MD:   CHIEF COMPLAINT: T-cell chronic lymphoid leukemia  CURRENT TREATMENT: observaton   INTERVAL HISTORY: Gabriel Aguilar returns today for follow-up of his T-cell lymphocytosis. He continues under observation.  His absolute lymphocyte count has been stable: Results for NICHALOS, BRENTON (MRN [id number]) as of 07/14/2019 09:02  Ref. Range 02/16/2011 09:44 05/14/2012 10:08 04/04/2013 15:56 05/26/2013 09:09 03/19/2015 08:05 05/05/2015 08:57 05/13/2015 02:30 07/01/2015 12:10 07/21/2015 15:53 05/14/2018 07:55 06/03/2018 16:50 06/20/2018 07:41 01/03/2019 10:52 06/06/2019 10:28 06/30/2019 12:00  Lymphocyte # Latest Ref Range: 0.7 - 4.0 K/uL 4.8 (H) 5.4 (H) 3.3 5.1 (H) 5.6 (H) 5.7 (H) 4.9 (H) 4.3 (H) 5.6 (H) 4.6 (H) 5.8 (H) 3.6 5.5 (H) 4.3 (H) 4.5 (H)   Of note, he is followed by dermatology for a history of non-melanoma skin cancer. He underwent several shave biopsies since his last visit, all of which were benign.  He is scheduled for colonoscopy on 08/26/2020 under Dr. Rush Landmark.   REVIEW OF SYSTEMS: Gabriel Aguilar is doing terrific.  He walks between 2 and 6 miles with his wife for exercise and is planning to get back to the gym.  He is planning a trip to [place] (his wife is Gabriel Aguilar) he has had no drenching sweats, no weight loss, no adenopathy, and no symptoms of disease progression.   COVID 19 VACCINATION STATUS: [place] x2, most recently 11/2019   HISTORY OF CURRENT ILLNESS: From the original intake note:  Gabriel Aguilar  was referred by Dr Sarajane Jews for evaluation and treatment of leukocytosis.   We have CBCs dating back to 02/16/2011, when his total white cell count was 10.6, with a hemoglobin of 14.3, MCV 93.4, and platelets 340,000.  Over the next several years all but 1 reading has been over 10.0, with one reading of 23,000 occurring when the patient had pericarditis, but other readings as high as 15.3 in the absence of any obvious infection.  Differentials have shown a consistently elevated lymphocyte count, being 4.8 at 02/16/2011 and 5.8 most recently, with no reading below 3.0 in between.  There has been no anemia or thrombocytopenia.  The patient's subsequent history is as detailed below.   PAST MEDICAL HISTORY: Past Medical History:  Diagnosis Date  . Arthritis   . Coronary artery disease   . Erectile dysfunction   . GERD (gastroesophageal reflux disease)   . Hiatal hernia   . History of pericarditis   . HTN (hypertension)   . Hyperlipidemia LDL goal <70   . Hypogonadism male    has seen  Dr. Orland Mustard  . Palpitation    a. montior 2 weeks 2014--> no arrhythmia  . Paroxysmal atrial fibrillation (HCC)   . Sleep apnea, central    wears a CPAP at night     PAST SURGICAL HISTORY: Past Surgical History:  Procedure Laterality Date  . BACK SURGERY  1994   L4-5  . CARDIAC CATHETERIZATION    . COLONOSCOPY  07-09-12   per Dr. Deatra Ina, adenomatous polyp, repeat in 5 yrs   . CORONARY STENT INTERVENTION N/A 05/29/2018   Procedure: CORONARY STENT  INTERVENTION;  Surgeon: Wellington Hampshire, MD;  Location: [facility];  Service: Cardiovascular;  Laterality: N/A;  RCA  . EYE SURGERY     LASIK  . LEFT HEART CATH AND CORONARY ANGIOGRAPHY N/A 05/29/2018   Procedure: LEFT HEART CATH AND CORONARY ANGIOGRAPHY;  Surgeon: Wellington Hampshire, MD;  Location: [facility];  Service: Cardiovascular;  Laterality: N/A;  . 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 SPINE SURGERY   1994   disc at L4-L5  . VASECTOMY      FAMILY HISTORY: Family History  Problem Relation Age of Onset  . Hyperlipidemia Aguilar 15       KIDNEY FAILURE  . Heart disease Aguilar   . Hypertension Aguilar   . Kidney disease Aguilar   . Diabetes Aguilar   . Heart disease Aguilar 36  . Other Sister        LIVER FAILURE  . CVA Brother   . Other Sister        AT BIRTH  . Heart attack Sister   . CVA Brother   . Other Brother        SEVERAL CABG  . Colon cancer Neg Hx    Gabriel Aguilar died from issues with a polycystic kidney at age 51. Gabriel Aguilar died from a myocardial infarction at age 61. The patient has 5 brothers and 2 sisters. Patient denies anyone in his family having breast, ovarian, prostate, or pancreatic cancer. Gabriel Aguilar's brother, Gabriel Aguilar, was diagnosed with pancreatic cancer in his 46's.  Another brother "has a high white count".   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 His wife, Gabriel Aguilar, works for [employer]. Zohan has two blood children and one step-child. Gabriel Aguilar's daughter, Gabriel Aguilar, is a Marine scientist in [place], [place]. Gabriel Aguilar's son is an Optometrist in [place]. Eryx's Bretta Bang, an an Optometrist for [employer] in [place], [place]. Antionio has 5 grandchildren. He attends the [employer].   ADVANCED DIRECTIVES: Emori's wife, Gabriel Aguilar, is automatically his healthcare power of attorney.    HEALTH MAINTENANCE: Social History   Tobacco Use  . Smoking status: Former Smoker    Packs/day: 1.50    Start date: 04/10/1964    Quit date: 03/06/1983    Years since quitting: 37.3  . Smokeless tobacco: Never Used  Vaping Use  . Vaping Use: Never used  Substance Use Topics  . Alcohol use: Yes    Alcohol/week: 1.0 standard drink    Types: 1 Glasses of wine per week    Comment: rare  . Drug use: No    Colonoscopy: overdue  PSA:   Bone density:   No Known Allergies  Current Outpatient Medications  Medication Sig Dispense Refill  .  amLODipine (NORVASC) 10 MG tablet TAKE ONE TABLET BY MOUTH ONCE DAILY 90 tablet 1  . Ascorbic Acid (VITAMIN C) 1000 MG tablet Take 1,000 mg by mouth daily.    [name] atorvastatin (LIPITOR) 80 MG tablet TAKE ONE TABLET BY MOUTH EVERY EVENING AT 6pm 90 tablet 3  . clopidogrel (PLAVIX) 75 MG tablet TAKE ONE TABLET BY MOUTH EVERY MORNING WITH BREAKFAST 90 tablet 1  . fenofibrate 54 MG tablet Take 1 tablet (54 mg total) by mouth daily. 90 tablet 3  . fluticasone (FLONASE) 50 MCG/ACT nasal spray Place 1 spray into both nostrils at bedtime.    . hydrochlorothiazide (HYDRODIURIL) 25 MG tablet TAKE ONE TABLET BY MOUTH ONCE DAILY 90 tablet  3  . ipratropium (ATROVENT) 0.03 % nasal spray Place 2 sprays into both nostrils at bedtime. 30 mL 12  . metoprolol tartrate (LOPRESSOR) 25 MG tablet Take 0.5 tablets (12.5 mg total) by mouth every 8 (eight) hours as needed (ELEVATED HEART RATE). 45 tablet 3  . mometasone (NASONEX) 50 MCG/ACT nasal spray Place 2 sprays into the nose daily. 17 g 11  . nitroGLYCERIN (NITROSTAT) 0.4 MG SL tablet Place 1 tablet (0.4 mg total) under the tongue every 5 (five) minutes as needed for chest pain. 25 tablet 1  . OMEGA 3 1000 MG CAPS Take 1 capsule by mouth daily.     [name] omeprazole (PRILOSEC) 40 MG capsule TAKE ONE CAPSULE BY MOUTH ONCE DAILY 90 capsule 1  . PEG-KCl-NaCl-NaSulf-Na Asc-C (PLENVU) 140 g SOLR Use as directed 1 each 0  . XARELTO 20 MG TABS tablet TAKE ONE TABLET BY MOUTH ONCE DAILY WITH SUPPER 90 tablet 1   No current facility-administered medications for this visit.    OBJECTIVE: white man who appears younger than stated age 38:   07/12/20 0944  BP: (!) 148/63  Pulse: 73  Resp: 18  Temp: 98.1 F (36.7 C)  SpO2: 98%   Wt Readings from Last 3 Encounters:  07/12/20 208 lb 1.6 oz (94.4 kg)  07/02/20 208 lb (94.3 kg)  06/11/20 210 lb 12.8 oz (95.6 kg)   Body mass index is 31.64 kg/m.    ECOG FS:1 - Symptomatic but completely ambulatory  Sclerae unicteric,  EOMs intact Wearing a mask No cervical or supraclavicular adenopathy, no axillary adenopathy Lungs no rales or rhonchi Heart regular rate and rhythm Abd soft, nontender, positive bowel sounds MSK no focal spinal tenderness, no upper extremity lymphedema Neuro: nonfocal, well oriented, appropriate affect   LAB RESULTS:  CMP     Component Value Date/Time   NA 137 06/06/2019 1028   K 3.5 06/06/2019 1028   CL 98 06/06/2019 1028   CO2 33 (H) 06/06/2019 1028   GLUCOSE 95 06/06/2019 1028   BUN 18 06/06/2019 1028   CREATININE 1.10 06/06/2019 1028   CREATININE 1.18 02/10/2016 1313   CALCIUM 10.0 06/06/2019 1028   PROT 6.5 05/14/2018 0755   ALBUMIN 4.4 05/14/2018 0755   AST 22 05/14/2018 0755   ALT 33 05/14/2018 0755   ALKPHOS 60 05/14/2018 0755   BILITOT 0.6 05/14/2018 0755   GFRNONAA >60 05/30/2018 0147   GFRAA >60 05/30/2018 0147    No results found for: TOTALPROTELP, ALBUMINELP, A1GS, A2GS, BETS, BETA2SER, GAMS, MSPIKE, SPEI  No results found for: KPAFRELGTCHN, LAMBDASER, KAPLAMBRATIO  Lab Results  Component Value Date   WBC 11.9 (H) 07/12/2020   NEUTROABS 6.5 07/12/2020   HGB 13.6 07/12/2020   HCT 39.3 07/12/2020   MCV 89.3 07/12/2020   PLT 376 07/12/2020   No results found for: LABCA2  No components found for: YSAYTK160  No results for input(s): INR in the last 168 hours.  No results found for: LABCA2  No results found for: FUX323  No results found for: FTD322  No results found for: GUR427  No results found for: CA2729  No components found for: HGQUANT  No results found for: CEA1 / No results found for: CEA1   No results found for: AFPTUMOR  No results found for: CHROMOGRNA  No results found for: PSA1  No results found for: HGBA, HGBA2QUANT, HGBFQUANT, HGBSQUAN (Hemoglobinopathy evaluation)   No results found for: LDH  No results found for: IRON, TIBC, IRONPCTSAT (Iron and TIBC)  Lab Results  Component Value Date   FERRITIN 121 06/20/2018     Urinalysis    Component Value Date/Time   COLORURINE YELLOW 07/01/2015 1210   APPEARANCEUR CLEAR 07/01/2015 1210   LABSPEC <=1.005 (A) 07/01/2015 1210   PHURINE 6.0 07/01/2015 1210   GLUCOSEU NEGATIVE 07/01/2015 1210   HGBUR SMALL (A) 07/01/2015 1210   BILIRUBINUR neg 05/14/2018 1059   KETONESUR NEGATIVE 07/01/2015 1210   PROTEINUR Negative 05/14/2018 1059   UROBILINOGEN 0.2 05/14/2018 1059   UROBILINOGEN 0.2 07/01/2015 1210   NITRITE neg 05/14/2018 1059   NITRITE NEGATIVE 07/01/2015 1210   LEUKOCYTESUR Negative 05/14/2018 1059     STUDIES:  No results found.   ELIGIBLE FOR AVAILABLE RESEARCH PROTOCOL: no   ASSESSMENT: 70 y.o. , [place] man with a mild persistent lymphocytosis in the absence of "B" symptoms, anemia, or thrombocytopenia.  (1) Flow cytometry 06/19/2018 showed no monoclonal B-cell population and no phenotypically aberrant T-cell population; there was a reversed CD4/CD8 ratio  (a) ratio still reversed (CD4/CD8 = 0.64) on retesting 01/03/2019  (b) repeat flow cytometry 06/30/2019 again shows reverse CD4/CD8 ratio and no monoclonal B-cell population  (2) T-cell gamma gene rearrangement by PCR 06/30/2019 confirms a clonal T-cell gene rearrangement, consistent with T-cell chronic lymphoid leukemia  (3) treatment: no indications for treatment as of March 2021  (a) hepatitis B surface antigen and core antibody and hepatitis C antibody negative 06/30/2019   PLAN: Jonluke is now 10 years into follow-up of his chronic T-cell lymphocytic leukemia.  There has been absolutely no evidence of progression and no indication for treatment.  As far as his T-cell leukemia is concerned all he needs is a yearly CBC with differential.  If the total lymphocyte count starts trending up then I think we can see him again.  Otherwise I am comfortable releasing him to his primary care physician at this point.  I would like to demonstrate that he made antibodies to the SARS 2  coronavirus.  We  check those today if enough blood was drawn.  Otherwise I will ask him to return next week for a blood draw.  If he did not make good antibodies or if his IgG level is very low (which I do not expect) we can consider Evus[place] and we did discuss that today  I will be glad to see Cesar again at any point in the future if and when the need arises but as of now are making no further routine appointments for him here.  Total encounter time 25 minutes.   Nicholaus Steinke, Virgie Dad, MD  07/12/20 12:04 PM Medical Oncology and Hematology [facility] [address] [place], [place] [address] Tel. [phone]    Fax. [phone]   Addendum: Pacey has normal immunoglobulin levels.  I did not document IgG antibodies against the Covid virus.  I checked with infectious disease (Dr. De Burrs) and he tells me these test really are not reliable enough for [place] to act on them.  If Tiffany had low antibody levels in general then the test would be more likely correct but given that he has normal levels the text is likely to be false negative.  He did suggest that I go ahead and get his booster and I passed all that information to him in a letter as well as a phone call.   I, Wilburn Mylar, am acting as scribe for Dr. Sarajane Jews C. Andriy Sherk.  Lindie Spruce MD, have reviewed the above documentation for accuracy and completeness,  and I agree with the above.   *Total Encounter Time as defined by the Centers for Medicare and Medicaid Services includes, in addition to the face-to-face time of a patient visit (documented in the note above) non-face-to-face time: obtaining and reviewing outside history, ordering and reviewing medications, tests or procedures, care coordination (communications with other health care professionals or caregivers) and documentation in the medical record.

## 2020-07-12 ENCOUNTER — Inpatient Hospital Stay: Payer: PPO | Attending: Oncology | Admitting: Oncology

## 2020-07-12 ENCOUNTER — Other Ambulatory Visit: Payer: Self-pay

## 2020-07-12 ENCOUNTER — Inpatient Hospital Stay: Payer: PPO

## 2020-07-12 VITALS — BP 148/63 | HR 73 | Temp 98.1°F | Resp 18 | Ht 68.0 in | Wt 208.1 lb

## 2020-07-12 DIAGNOSIS — Z800 Family history of malignant neoplasm of digestive organs: Secondary | ICD-10-CM | POA: Diagnosis not present

## 2020-07-12 DIAGNOSIS — C91Z0 Other lymphoid leukemia not having achieved remission: Secondary | ICD-10-CM

## 2020-07-12 DIAGNOSIS — I480 Paroxysmal atrial fibrillation: Secondary | ICD-10-CM | POA: Diagnosis not present

## 2020-07-12 DIAGNOSIS — Z7901 Long term (current) use of anticoagulants: Secondary | ICD-10-CM | POA: Insufficient documentation

## 2020-07-12 DIAGNOSIS — B1910 Unspecified viral hepatitis B without hepatic coma: Secondary | ICD-10-CM | POA: Insufficient documentation

## 2020-07-12 DIAGNOSIS — Z8349 Family history of other endocrine, nutritional and metabolic diseases: Secondary | ICD-10-CM | POA: Diagnosis not present

## 2020-07-12 DIAGNOSIS — D72820 Lymphocytosis (symptomatic): Secondary | ICD-10-CM | POA: Diagnosis not present

## 2020-07-12 DIAGNOSIS — I319 Disease of pericardium, unspecified: Secondary | ICD-10-CM | POA: Diagnosis not present

## 2020-07-12 DIAGNOSIS — Z905 Acquired absence of kidney: Secondary | ICD-10-CM | POA: Insufficient documentation

## 2020-07-12 DIAGNOSIS — Z8249 Family history of ischemic heart disease and other diseases of the circulatory system: Secondary | ICD-10-CM | POA: Insufficient documentation

## 2020-07-12 DIAGNOSIS — Z833 Family history of diabetes mellitus: Secondary | ICD-10-CM | POA: Diagnosis not present

## 2020-07-12 DIAGNOSIS — Z79899 Other long term (current) drug therapy: Secondary | ICD-10-CM | POA: Diagnosis not present

## 2020-07-12 DIAGNOSIS — Z87891 Personal history of nicotine dependence: Secondary | ICD-10-CM | POA: Insufficient documentation

## 2020-07-12 DIAGNOSIS — Z8379 Family history of other diseases of the digestive system: Secondary | ICD-10-CM | POA: Diagnosis not present

## 2020-07-12 DIAGNOSIS — I441 Atrioventricular block, second degree: Secondary | ICD-10-CM

## 2020-07-12 DIAGNOSIS — Z841 Family history of disorders of kidney and ureter: Secondary | ICD-10-CM | POA: Diagnosis not present

## 2020-07-12 DIAGNOSIS — C9150 Adult T-cell lymphoma/leukemia (HTLV-1-associated) not having achieved remission: Secondary | ICD-10-CM | POA: Insufficient documentation

## 2020-07-12 DIAGNOSIS — I2510 Atherosclerotic heart disease of native coronary artery without angina pectoris: Secondary | ICD-10-CM | POA: Diagnosis not present

## 2020-07-12 DIAGNOSIS — I10 Essential (primary) hypertension: Secondary | ICD-10-CM | POA: Insufficient documentation

## 2020-07-12 LAB — CBC WITH DIFFERENTIAL/PLATELET
Abs Immature Granulocytes: 0.04 10*3/uL (ref 0.00–0.07)
Basophils Absolute: 0 10*3/uL (ref 0.0–0.1)
Basophils Relative: 0 %
Eosinophils Absolute: 0.3 10*3/uL (ref 0.0–0.5)
Eosinophils Relative: 3 %
HCT: 39.3 % (ref 39.0–52.0)
Hemoglobin: 13.6 g/dL (ref 13.0–17.0)
Immature Granulocytes: 0 %
Lymphocytes Relative: 35 %
Lymphs Abs: 4.1 10*3/uL — ABNORMAL HIGH (ref 0.7–4.0)
MCH: 30.9 pg (ref 26.0–34.0)
MCHC: 34.6 g/dL (ref 30.0–36.0)
MCV: 89.3 fL (ref 80.0–100.0)
Monocytes Absolute: 0.9 10*3/uL (ref 0.1–1.0)
Monocytes Relative: 7 %
Neutro Abs: 6.5 10*3/uL (ref 1.7–7.7)
Neutrophils Relative %: 55 %
Platelets: 376 10*3/uL (ref 150–400)
RBC: 4.4 MIL/uL (ref 4.22–5.81)
RDW: 13.1 % (ref 11.5–15.5)
WBC: 11.9 10*3/uL — ABNORMAL HIGH (ref 4.0–10.5)
nRBC: 0 % (ref 0.0–0.2)

## 2020-07-12 LAB — SAR COV2 SEROLOGY (COVID19)AB(IGG),IA: SARS-CoV-2 Ab, IgG: NONREACTIVE

## 2020-07-12 LAB — SAVE SMEAR(SSMR), FOR PROVIDER SLIDE REVIEW

## 2020-07-13 ENCOUNTER — Encounter: Payer: Self-pay | Admitting: Oncology

## 2020-07-13 LAB — IGG, IGA, IGM
IgA: 85 mg/dL (ref 61–437)
IgG (Immunoglobin G), Serum: 979 mg/dL (ref 603–1613)
IgM (Immunoglobulin M), Srm: 28 mg/dL (ref 15–143)

## 2020-07-23 ENCOUNTER — Telehealth: Payer: Self-pay | Admitting: Adult Health

## 2020-07-23 DIAGNOSIS — G4733 Obstructive sleep apnea (adult) (pediatric): Secondary | ICD-10-CM | POA: Diagnosis not present

## 2020-07-23 NOTE — Chronic Care Management (AMB) (Signed)
I left the patient a message about his upcoming appointment on 07/26/2020 @ 8:30 am with the clinical pharmacist. He was asked to please have all medication on hand to review with the pharmacist.   Neita Goodnight) Mare Ferrari, [place] [phone]

## 2020-07-26 ENCOUNTER — Ambulatory Visit (INDEPENDENT_AMBULATORY_CARE_PROVIDER_SITE_OTHER): Payer: PPO | Admitting: Pharmacist

## 2020-07-26 DIAGNOSIS — I480 Paroxysmal atrial fibrillation: Secondary | ICD-10-CM

## 2020-07-26 DIAGNOSIS — E785 Hyperlipidemia, unspecified: Secondary | ICD-10-CM

## 2020-07-26 DIAGNOSIS — I10 Essential (primary) hypertension: Secondary | ICD-10-CM | POA: Diagnosis not present

## 2020-07-26 NOTE — Progress Notes (Signed)
Chronic Care Management Pharmacy Note  07/26/2020 Name:  Gabriel Aguilar MRN:  [id number] DOB:  [birth year]  Subjective: Gabriel Aguilar is an 70 y.o. year old male who is a primary patient of Laurey Morale, MD.  The CCM team was consulted for assistance with disease management and care coordination needs.    Engaged with patient by telephone for follow up visit in response to provider referral for pharmacy case management and/or care coordination services.   Consent to Services:  The patient was given information about Chronic Care Management services, agreed to services, and gave verbal consent prior to initiation of services.  Please see initial visit note for detailed documentation.   Patient Care Team: Laurey Morale, MD as PCP - General (Family Medicine) [name], MD as PCP - Cardiology (Cardiology) Thompson Grayer, MD as PCP - Electrophysiology (Cardiology) Rigoberto Noel, MD as Consulting Physician (Pulmonary Disease) Magrinat, Virgie Dad, MD as Consulting Physician (Oncology) Viona Gilmore, [facility] as Pharmacist (Pharmacist)  Recent office visits: 03/17/20 Alysia Penna, MD: Patient presented for immunization counseling. Patient advised to get COVID booster, influenza vaccine administered and cipro prescribed for trip.  03/17/20 Ofilia Neas, LPN: Patient presented for AWV.  Recent consult visits: 07/12/20 Lurline Del, MD (oncology): Patient presented for follow up for T-cell lymphocytosis.   07/02/20 Alonza Bogus, MD (gastro): Patient presented for colonoscopy prep.  06/11/20 [name], MD (cardiology): Patient presented for palpitations. Patient drank wine and too much caffeine and went into Afib. No changes made.  02/25/20 Gloris Manchester PA (dermatology): Patient presented for follow up. Unable to access notes.  01/29/20 Kara Mead, MD (pulmonology): Patient presented for OSA follow up. Prescribed ipratropium bromide and trial of Claritin/Zyrtec for nasal congestion  to decrease Afrin use. Continue CPAP use for 4-6 hours per night.   Hospital visits: None in previous 6 months  Objective:  Lab Results  Component Value Date   CREATININE 1.10 06/06/2019   BUN 18 06/06/2019   GFR 66.08 06/06/2019   GFRNONAA >60 05/30/2018   GFRAA >60 05/30/2018   NA 137 06/06/2019   K 3.5 06/06/2019   CALCIUM 10.0 06/06/2019   CO2 33 (H) 06/06/2019   GLUCOSE 95 06/06/2019    Lab Results  Component Value Date/Time   HGBA1C 5.7 06/06/2019 10:28 AM   HGBA1C 5.8 (H) 05/28/2018 03:18 AM   GFR 66.08 06/06/2019 10:28 AM   GFR 50.59 (L) 06/03/2018 04:50 PM    Last diabetic Eye exam: No results found for: HMDIABEYEEXA  Last diabetic Foot exam: No results found for: HMDIABFOOTEX   Lab Results  Component Value Date   CHOL 171 05/28/2018   HDL 37 (L) 05/28/2018   LDLCALC 73 05/28/2018   LDLDIRECT 97.0 05/14/2018   TRIG 307 (H) 05/28/2018   CHOLHDL 4.6 05/28/2018    Hepatic Function Latest Ref Rng & Units 05/14/2018 05/11/2015 03/19/2015  Total Protein 6.0 - 8.3 g/dL 6.5 6.7 6.4  Albumin 3.5 - 5.2 g/dL 4.4 4.2 4.2  AST 0 - 37 U/L 22 32 30  ALT 0 - 53 U/L 33 36 38  Alk Phosphatase 39 - 117 U/L 60 63 56  Total Bilirubin 0.2 - 1.2 mg/dL 0.6 0.7 0.6  Bilirubin, Direct 0.0 - 0.3 mg/dL 0.1 - 0.1    Lab Results  Component Value Date/Time   TSH 0.91 06/06/2019 10:28 AM   TSH 0.89 05/14/2018 07:55 AM    CBC Latest Ref Rng & Units 07/12/2020 06/30/2019 06/06/2019  WBC 4.0 - 10.5  K/uL 11.9(H) 10.9(H) 12.2(H)  Hemoglobin 13.0 - 17.0 g/dL 13.6 13.5 13.6  Hematocrit 39.0 - 52.0 % 39.3 39.5 39.5  Platelets 150 - 400 K/uL 376 390 409.0(H)    No results found for: VD25OH  Clinical ASCVD: Yes  The ASCVD Risk score Mikey Bussing DC Jr., et al., 2013) failed to calculate for the following reasons:   The patient has a prior MI or stroke diagnosis    Depression screen [facility] 2/9 03/17/2020 07/24/2019 05/13/2018  Decreased Interest 0 0 0  Down, Depressed, Hopeless 0 0 0  PHQ - 2  Score 0 0 0  Altered sleeping 0 - 3  Tired, decreased energy 0 - 1  Change in appetite 0 - 0  Feeling bad or failure about yourself  0 - 0  Trouble concentrating 0 - 0  Moving slowly or fidgety/restless 0 - 0  Suicidal thoughts 0 - 0  PHQ-9 Score 0 - 4  Difficult doing work/chores Not difficult at all - -     CHA2DS2/VAS Stroke Risk Points  Current as of 4 hours ago     3 >= 2 Points: High Risk  1 - 1.99 Points: Medium Risk  0 Points: Low Risk    Last Change: N/A      Details    This score determines the patient's risk of having a stroke if the  patient has atrial fibrillation.       Points Metrics  0 Has Congestive Heart Failure:  No    Current as of 4 hours ago  1 Has Vascular Disease:  Yes    Current as of 4 hours ago  1 Has Hypertension:  Yes    Current as of 4 hours ago  1 Age:  14    Current as of 4 hours ago  0 Has Diabetes:  No    Current as of 4 hours ago  0 Had Stroke:  No  Had TIA:  No  Had Thromboembolism:  No    Current as of 4 hours ago  0 Male:  No    Current as of 4 hours ago       Social History   Tobacco Use  Smoking Status Former Smoker  . Packs/day: 1.50  . Start date: 04/10/1964  . Quit date: 03/06/1983  . Years since quitting: 37.4  Smokeless Tobacco Never Used   BP Readings from Last 3 Encounters:  07/12/20 (!) 148/63  07/02/20 140/60  06/11/20 (!) 148/72   Pulse Readings from Last 3 Encounters:  07/12/20 73  07/02/20 78  06/11/20 72   Wt Readings from Last 3 Encounters:  07/12/20 208 lb 1.6 oz (94.4 kg)  07/02/20 208 lb (94.3 kg)  06/11/20 210 lb 12.8 oz (95.6 kg)   BMI Readings from Last 3 Encounters:  07/12/20 31.64 kg/m  07/02/20 31.63 kg/m  06/11/20 32.05 kg/m    Assessment/Interventions: Review of patient past medical history, allergies, medications, health status, including review of consultants reports, laboratory and other test data, was performed as part of comprehensive evaluation and provision of chronic  care management services.   SDOH:  (Social Determinants of Health) assessments and interventions performed: No  SDOH Screenings   Alcohol Screen: Low Risk   . Last Alcohol Screening Score (AUDIT): 1  Depression (PHQ2-9): Low Risk   . PHQ-2 Score: 0  Financial Resource Strain: Low Risk   . Difficulty of Paying Living Expenses: Not hard at all  Food Insecurity: No Food Insecurity  . Worried  About Running Out of Food in the Last Year: Never true  . Ran Out of Food in the Last Year: Never true  Housing: Low Risk   . Last Housing Risk Score: 0  Physical Activity: Sufficiently Active  . Days of Exercise per Week: 7 days  . Minutes of Exercise per Session: 40 min  Social Connections: Moderately Integrated  . Frequency of Communication with Friends and Family: More than three times a week  . Frequency of Social Gatherings with Friends and Family: More than three times a week  . Attends Religious Services: More than 4 times per year  . Active Member of Clubs or Organizations: No  . Attends Archivist Meetings: Never  . Marital Status: Married  Stress: No Stress Concern Present  . Feeling of Stress : Not at all  Tobacco Use: Medium Risk  . Smoking Tobacco Use: Former Smoker  . Smokeless Tobacco Use: Never Used  Transportation Needs: No Transportation Needs  . Lack of Transportation (Medical): No  . Lack of Transportation (Non-Medical): No    CCM Care Plan  No Known Allergies  Medications Reviewed Today    Reviewed by Loralie Champagne, PA-C (Physician Assistant) on 07/02/20 at [place] List Status: <None>  Medication Order Taking? Sig Documenting Provider Last Dose Status Informant  amLODipine (NORVASC) 10 MG tablet [id number] Yes TAKE ONE TABLET BY MOUTH ONCE DAILY [name], MD Taking Active   Ascorbic Acid (VITAMIN C) 1000 MG tablet [id number] Yes Take 1,000 mg by mouth daily. [provider] Taking Active Self  atorvastatin (LIPITOR) 80 MG tablet  [id number] Yes TAKE ONE TABLET BY MOUTH EVERY EVENING AT Hortencia Conradi, MD Taking Active   clopidogrel (PLAVIX) 75 MG tablet [id number] Yes TAKE ONE TABLET BY MOUTH EVERY MORNING WITH Nettie Elm, MD Taking Active   fenofibrate 54 MG tablet [id number] Yes Take 1 tablet (54 mg total) by mouth daily. [name], MD Taking Active   fluticasone [facility]) 50 MCG/ACT nasal spray [id number] Yes Place 1 spray into both nostrils at bedtime. [provider] Taking Active Self  hydrochlorothiazide (HYDRODIURIL) 25 MG tablet [id number] Yes TAKE ONE TABLET BY MOUTH ONCE DAILY [name], MD Taking Active   ipratropium (ATROVENT) 0.03 % nasal spray [id number] Yes Place 2 sprays into both nostrils at bedtime. Rigoberto Noel, MD Taking Active   metoprolol tartrate (LOPRESSOR) 25 MG tablet [id number]  Take 0.5 tablets (12.5 mg total) by mouth every 8 (eight) hours as needed (ELEVATED HEART RATE). [name], MD  Expired 12/21/19 2359   mometasone (NASONEX) 50 MCG/ACT nasal spray [id number] Yes Place 2 sprays into the nose daily. Rigoberto Noel, MD Taking Active   nitroGLYCERIN (NITROSTAT) 0.4 MG SL tablet [id number] Yes Place 1 tablet (0.4 mg total) under the tongue every 5 (five) minutes as needed for chest pain. [name], MD Taking Active   OMEGA 3 1000 MG CAPS [id number] Yes Take 1 capsule by mouth daily.  [provider] Taking Active Self  omeprazole (PRILOSEC) 40 MG capsule [id number] Yes TAKE ONE CAPSULE BY MOUTH ONCE DAILY [name], MD Taking Active   XARELTO 20 MG TABS tablet [id number] Yes TAKE ONE TABLET BY MOUTH ONCE DAILY WITH SUPPER [name], MD Taking Active           Patient Active Problem List   Diagnosis Date Noted  . History of colon polyps 07/02/2020  . Chronic anticoagulation 07/02/2020  . Perennial non-allergic rhinitis 01/29/2020  . 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 Antiplatelet or antithrombotic long-term  use 09/16/2018  . Coronary artery disease involving native coronary artery of native heart without angina pectoris 09/16/2018  . Dyslipidemia 09/16/2018  . Essential hypertension 09/16/2018  . Educated about COVID-19 virus infection 09/16/2018  . Ankle edema, bilateral 07/17/2018  . Status post coronary artery stent placement   . NSTEMI (non-ST elevated myocardial infarction) ([place])   . Elevated troponin 05/28/2018  . Prostatitis 07/05/2015  . BPH (benign prostatic hyperplasia) 07/01/2015  . Dysuria 07/01/2015  . PAF (paroxysmal atrial fibrillation) ([place])   . Acute pericarditis   . Second degree AV block   . Chest pain 05/11/2015  . GERD (gastroesophageal reflux disease) 05/11/2015  . Persistent lymphocytosis 05/11/2015  . Single kidney 05/11/2015  . Hyperlipidemia 05/11/2015  . Other and unspecified hyperlipidemia 06/02/2013  . OSA (obstructive sleep apnea) 03/05/2013    Immunization History  Administered Date(s) Administered  . Fluad Quad(high Dose 65+) 03/17/2020  . Influenza Split 02/22/2011  . PFIZER(Purple Top)SARS-COV-2 Vaccination 11/05/2019, 12/05/2019  . Pneumococcal Conjugate-13 05/13/2018    Conditions to be addressed/monitored:  Hypertension, Hyperlipidemia, Atrial Fibrillation, Coronary Artery Disease and GERD  Care Plan : CCM Pharmacy Care Plan  Updates made by Viona Gilmore, [place] since 07/26/2020 12:00 AM    Problem: Problem:  HTN, HLD, Paroxysmal Afib, CAD/NSTEMI, GERD     Long-Range Goal: Patient-Specific Goal   Start Date: 07/26/2020  Expected End Date: 07/26/2021  This Visit's Progress: On track  Priority: High  Note:   Current Barriers:  . Unable to independently monitor therapeutic efficacy . Unable to achieve control of cholesterol   Pharmacist Clinical Goal(s):  [name] Patient will achieve adherence to monitoring guidelines and medication adherence to achieve therapeutic efficacy . achieve control of cholesterol as evidenced by next lipid panel   through collaboration with PharmD and provider.   Interventions: . 1:1 collaboration with Laurey Morale, MD regarding development and update of comprehensive plan of care as evidenced by provider attestation and co-signature . Inter-disciplinary care team collaboration (see longitudinal plan of care) . Comprehensive medication review performed; medication list updated in electronic medical record  Hypertension (BP goal <130/80) -Uncontrolled -Current treatment:  Hydrochlorothiazide 6m, 1 tablet once daily   Amlodipine 136m 1 tablet once daily -Medications previously tried: none  -Current home readings: does not check regularly only when he thinks about it -Current dietary habits: tries to limit salt intake -Current exercise habits: goes to the gym every day and uses the treadmill some days and weights most days -Denies hypotensive/hypertensive symptoms -Educated on Exercise goal of 150 minutes per week; Importance of home blood pressure monitoring; Proper BP monitoring technique; -Counseled to monitor BP at home weekly, document, and provide log at future appointments -Counseled on diet and exercise extensively Recommended to continue current medication  Hyperlipidemia: (LDL goal < 70) -Uncontrolled -Current treatment:  Atorvastatin 8088m1 tablet once daily at 6 pm  Fenofibrate 59m28m tablet once daily  Omega-3 1000mg59mcapsule once daily -Medications previously tried: none  -Current dietary patterns: did not discuss -Current exercise habits: goes to the gym every day and uses the treadmill some days and weights most days -Educated on Cholesterol goals;  Importance of limiting foods high in cholesterol; Exercise goal of 150 minutes per week; -Counseled on diet and exercise extensively Recommended to continue current medication Recommended follow up with PCP to recheck cholesterol  Atrial Fibrillation (Goal: prevent stroke and major  bleeding) -Controlled -CHADSVASC: 3 -Current  treatment: . Rate control: none . Anticoagulation: Xarelto 668m, 1 tablet once daily -Medications previously tried: 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) -Home BP and HR readings: does not check regularly  -Counseled on bleeding risk associated with Xarelto and importance of self-monitoring for signs/symptoms of bleeding; avoidance of NSAIDs due to increased bleeding risk with anticoagulants; -Recommended to continue current medication  CAD/history of MI (Goal: prevent future heart attacks and strokes) -Controlled -Current treatment   Clopidogrel 768m 1 tablet once daily   Nitroglycerin 0.68m22m1 tablet under tongue as needed for chest pain -Medications previously tried: none  -Recommended to continue current medication  GERD (Goal: minimize symptoms of acid reflux/heartburn) -Controlled -Current treatment  . Omeprazole 42m76m capsule once daily -Medications previously tried: none  -Counseled on non-pharmacological interventions for acid reflux. Take measures to prevent acid reflux, such as avoiding spicy foods, avoiding caffeine, avoid laying down a few hours after eating, and raising the head of the bed.  Allergic rhinitis (Goal: minimize symptoms of allergies) -Uncontrolled -Current treatment   Afrin only as needed (works through the night)  Fluticasone 50mc36mt nasal spray, 1 spray into both nostrils at bedtime - not taking  Claritin 10 mg 1 tablet at night  -Medications previously tried: ipratropium (ineffective)  -Counseled on limiting use of Afrin to 3 consecutive days due to rebound congestion and increased BP Recommended trial of Flonase to prevent overuse of Afrin and can consider switching to alternative antihistamine such as Zyrtec or Allegra if Claritin is not helping   Health Maintenance -Vaccine gaps: Pneumovax or Prevnar 20, shingles, tetanus -Current therapy:  . Ascorbic acid (Vitamin C) 1000mg,41mablet once  daily (recommended to drink plenty water with it)  -Educated on Cost vs benefit of each product must be carefully weighed by individual consumer -Patient is satisfied with current therapy and denies issues -Recommended to continue current medication  Patient Goals/Self-Care Activities . Patient will:  - take medications as prescribed check blood pressure weekly, document, and provide at future appointments target a minimum of 150 minutes of moderate intensity exercise weekly  Follow Up Plan: Telephone follow up appointment with care management team member scheduled for: 6 months        Medication Assistance: None required.  Patient affirms current coverage meets needs.  Patient's preferred pharmacy is:  [facility] [place] [address] [place] [address]: [phone] #[id number] [place] [address] [phone] [address] [address] [place] [phone] box? Yes Pt endorses 100% compliance  We discussed: Benefits of medication synchronization, packaging and delivery as well as enhanced pharmacist oversight with [facility]. Patient decided to: Utilize [facility] pharmacy for medication synchronization, packaging and delivery  Care Plan and Follow Up Patient Decision:  Patient agrees to Care Plan and Follow-up.  Plan: Telephone follow up appointment with care management team member scheduled for:  6 months  MadelinJeni SallesD BCACP CWilsoncist [phone]

## 2020-07-26 NOTE — Patient Instructions (Addendum)
Hi Gabriel Aguilar,  It was great getting to speak with you again! As we discussed, please limit the use of Afrin as this could be making your congestion even worse. Please give the Flonase a try for a few nights to see if that helps. If not, consider switching the Claritin to Zyrtec or Allegra for additional relief.  Please also set a reminder to check your blood pressure at least once a week as it has been elevated in the office recently and we want to make sure your medications are working properly.   Please reach out to me if you have any questions or need anything before our follow up!  Best, Maddie  Jeni Salles, PharmD, [facility]  Visit Information  Goals Addressed   None    Patient Care Plan: CCM Pharmacy Care Plan    Problem Identified: Problem:  HTN, HLD, Paroxysmal Afib, CAD/NSTEMI, GERD     Long-Range Goal: Patient-Specific Goal   Start Date: 07/26/2020  Expected End Date: 07/26/2021  This Visit's Progress: On track  Priority: High  Note:   Current Barriers:  . Unable to independently monitor therapeutic efficacy . Unable to achieve control of cholesterol   Pharmacist Clinical Goal(s):  [name] Patient will achieve adherence to monitoring guidelines and medication adherence to achieve therapeutic efficacy . achieve control of cholesterol as evidenced by next lipid panel  through collaboration with PharmD and provider.   Interventions: . 1:1 collaboration with Laurey Morale, MD regarding development and update of comprehensive plan of care as evidenced by provider attestation and co-signature . Inter-disciplinary care team collaboration (see longitudinal plan of care) . Comprehensive medication review performed; medication list updated in electronic medical record  Hypertension (BP goal <130/80) -Uncontrolled -Current treatment:  Hydrochlorothiazide 25mg , 1 tablet once daily   Amlodipine 10mg , 1 tablet once  daily -Medications previously tried: none  -Current home readings: does not check regularly only when he thinks about it -Current dietary habits: tries to limit salt intake -Current exercise habits: goes to the gym every day and uses the treadmill some days and weights most days -Denies hypotensive/hypertensive symptoms -Educated on Exercise goal of 150 minutes per week; Importance of home blood pressure monitoring; Proper BP monitoring technique; -Counseled to monitor BP at home weekly, document, and provide log at future appointments -Counseled on diet and exercise extensively Recommended to continue current medication  Hyperlipidemia: (LDL goal < 70) -Uncontrolled -Current treatment:  Atorvastatin 80mg , 1 tablet once daily at 6 pm  Fenofibrate 54mg , 1 tablet once daily  Omega-3 1000mg , 1 capsule once daily -Medications previously tried: none  -Current dietary patterns: did not discuss -Current exercise habits: goes to the gym every day and uses the treadmill some days and weights most days -Educated on Cholesterol goals;  Importance of limiting foods high in cholesterol; Exercise goal of 150 minutes per week; -Counseled on diet and exercise extensively Recommended to continue current medication Recommended follow up with PCP to recheck cholesterol  Atrial Fibrillation (Goal: prevent stroke and major bleeding) -Controlled -CHADSVASC: 3 -Current treatment: . Rate control: none . 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) -Home BP and HR readings: does not check regularly  -Counseled on bleeding risk associated with Xarelto and importance of self-monitoring for signs/symptoms of bleeding; avoidance of NSAIDs due to increased bleeding risk with anticoagulants; -Recommended to continue current medication  CAD/history of MI (Goal: prevent future heart attacks and strokes) -Controlled -Current  treatment    Clopidogrel 75mg , 1 tablet once daily   Nitroglycerin 0.4mg , 1 tablet under tongue as needed for chest pain -Medications previously tried: none  -Recommended to continue current medication  GERD (Goal: minimize symptoms of acid reflux/heartburn) -Controlled -Current treatment  . Omeprazole 40mg , 1 capsule once daily -Medications previously tried: none  -Counseled on non-pharmacological interventions for acid reflux. Take measures to prevent acid reflux, such as avoiding spicy foods, avoiding caffeine, avoid laying down a few hours after eating, and raising the head of the bed.  Allergic rhinitis (Goal: minimize symptoms of allergies) -Uncontrolled -Current treatment   Afrin only as needed (works through the night)  Fluticasone 36mcg/act nasal spray, 1 spray into both nostrils at bedtime - not taking  Claritin 10 mg 1 tablet at night  -Medications previously tried: ipratropium (ineffective)  -Counseled on limiting use of Afrin to 3 consecutive days due to rebound congestion and increased BP Recommended trial of Flonase to prevent overuse of Afrin and can consider switching to alternative antihistamine such as Zyrtec or Allegra if Claritin is not helping   Health Maintenance -Vaccine gaps: Pneumovax or Prevnar 20, shingles, tetanus -Current therapy:  . Ascorbic acid (Vitamin C) 1000mg , 1 tablet once daily (recommended to drink plenty water with it)  -Educated on Cost vs benefit of each product must be carefully weighed by individual consumer -Patient is satisfied with current therapy and denies issues -Recommended to continue current medication  Patient Goals/Self-Care Activities . Patient will:  - take medications as prescribed check blood pressure weekly, document, and provide at future appointments target a minimum of 150 minutes of moderate intensity exercise weekly  Follow Up Plan: Telephone follow up appointment with care management team member scheduled for: 6  months       Patient verbalizes understanding of instructions provided today and agrees to view in [place].  Telephone follow up appointment with pharmacy team member scheduled for:6 months  Viona Gilmore, [facility]  How to Take Your Blood Pressure Blood pressure measures how strongly your blood is pressing against the walls of your arteries. Arteries are blood vessels that carry blood from your heart throughout your body. You can take your blood pressure at home with a machine. You may need to check your blood pressure at home:  To check if you have high blood pressure (hypertension).  To check your blood pressure over time.  To make sure your blood pressure medicine is working. Supplies needed:  Blood pressure machine, or monitor.  Dining room chair to sit in.  Table or desk.  Small notebook.  Pencil or pen. How to prepare Avoid these things for 30 minutes before checking your blood pressure:  Having drinks with caffeine in them, such as coffee or tea.  Drinking alcohol.  Eating.  Smoking.  Exercising. Do these things five minutes before checking your blood pressure:  Go to the bathroom and pee (urinate).  Sit in a dining chair. Do not sit in a soft couch or an armchair.  Be quiet. Do not talk. How to take your blood pressure Follow the instructions that came with your machine. If you have a digital blood pressure monitor, these may be the instructions: 1. Sit up straight. 2. Place your feet on the floor. Do not cross your ankles or legs. 3. Rest your left arm at the level of your heart. You may rest it on a table, desk, or chair. 4. Pull up your shirt sleeve. 5. Wrap the blood pressure cuff around  the upper part of your left arm. The cuff should be 1 inch (2.5 cm) above your elbow. It is best to wrap the cuff around bare skin. 6. Fit the cuff snugly around your arm. You should be able to place only one finger between the cuff and your arm. 7. Place the cord so  that it rests in the bend of your elbow. 8. Press the power button. 9. Sit quietly while the cuff fills with air and loses air. 10. Write down the numbers on the screen. 11. Wait 2-3 minutes and then repeat steps 1-10.   What do the numbers mean? Two numbers make up your blood pressure. The first number is called systolic pressure. The second is called diastolic pressure. An example of a blood pressure reading is "120 over 80" (or 120/80). If you are an adult and do not have a medical condition, use this guide to find out if your blood pressure is normal: Normal  First number: below 120.  Second number: below 80. Elevated  First number: 120-129.  Second number: below 80. Hypertension stage 1  First number: 130-139.  Second number: 80-89. Hypertension stage 2  First number: 140 or above.  Second number: 28 or above. Your blood pressure is above normal even if only the top or bottom number is above normal. Follow these instructions at home:  Check your blood pressure as often as your doctor tells you to.  Check your blood pressure at the same time every day.  Take your monitor to your next doctor's appointment. Your doctor will: ? Make sure you are using it correctly. ? Make sure it is working right.  Make sure you understand what your blood pressure numbers should be.  Tell your doctor if your medicine is causing side effects.  Keep all follow-up visits as told by your doctor. This is important. General tips:  You will need a blood pressure machine, or monitor. Your doctor can suggest a monitor. You can buy one at a drugstore or online. When choosing one: ? Choose one with an arm cuff. ? Choose one that wraps around your upper arm. Only one finger should fit between your arm and the cuff. ? Do not choose one that measures your blood pressure from your wrist or finger. Where to find more information American Heart Association: www.heart.org Contact a doctor  if:  Your blood pressure keeps being high. Get help right away if:  Your first blood pressure number is higher than 180.  Your second blood pressure number is higher than 120. Summary  Check your blood pressure at the same time every day.  Avoid caffeine, alcohol, smoking, and exercise for 30 minutes before checking your blood pressure.  Make sure you understand what your blood pressure numbers should be. This information is not intended to replace advice given to you by your health care provider. Make sure you discuss any questions you have with your health care provider. Document Revised: 03/21/2019 Document Reviewed: 03/21/2019 Elsevier Patient Education  2021 [employer].

## 2020-07-29 ENCOUNTER — Other Ambulatory Visit: Payer: Self-pay | Admitting: Cardiology

## 2020-08-02 ENCOUNTER — Other Ambulatory Visit: Payer: Self-pay | Admitting: Family Medicine

## 2020-08-02 NOTE — Telephone Encounter (Signed)
Patient called needing a refill on an antibiotic that Dr. Sarajane Jews sent in for him at the end of December when he went on a trip to [place] and he was wondering if he can send that Rx in again since he's going back to [place].  [facility] - [place], [place] - [address] Phone:  [phone]  Fax:  [phone]

## 2020-08-02 NOTE — Telephone Encounter (Signed)
Pt state that he is leaving for [place] on Thursday 08/05/2020 and would like another dose of Cipro sent in to his pharmacy so his pharmacy can have time to deliver Rx before Thursday. Pt has appointment on Wed but is not able to wait until then due to his pharmacy shipments. Please advise

## 2020-08-04 ENCOUNTER — Encounter: Payer: Self-pay | Admitting: Family Medicine

## 2020-08-04 ENCOUNTER — Other Ambulatory Visit: Payer: Self-pay

## 2020-08-04 ENCOUNTER — Ambulatory Visit (INDEPENDENT_AMBULATORY_CARE_PROVIDER_SITE_OTHER): Payer: PPO | Admitting: Family Medicine

## 2020-08-04 VITALS — BP 140/78 | HR 65 | Temp 98.1°F | Ht 68.0 in | Wt 202.8 lb

## 2020-08-04 DIAGNOSIS — Z23 Encounter for immunization: Secondary | ICD-10-CM

## 2020-08-04 DIAGNOSIS — Z0000 Encounter for general adult medical examination without abnormal findings: Secondary | ICD-10-CM

## 2020-08-04 DIAGNOSIS — Z20822 Contact with and (suspected) exposure to covid-19: Secondary | ICD-10-CM | POA: Diagnosis not present

## 2020-08-04 LAB — CBC WITH DIFFERENTIAL/PLATELET
Basophils Absolute: 0.1 10*3/uL (ref 0.0–0.1)
Basophils Relative: 0.5 % (ref 0.0–3.0)
Eosinophils Absolute: 0.3 10*3/uL (ref 0.0–0.7)
Eosinophils Relative: 2.8 % (ref 0.0–5.0)
HCT: 38.9 % — ABNORMAL LOW (ref 39.0–52.0)
Hemoglobin: 13.7 g/dL (ref 13.0–17.0)
Lymphocytes Relative: 39.1 % (ref 12.0–46.0)
Lymphs Abs: 4.4 10*3/uL — ABNORMAL HIGH (ref 0.7–4.0)
MCHC: 35.2 g/dL (ref 30.0–36.0)
MCV: 91 fl (ref 78.0–100.0)
Monocytes Absolute: 1 10*3/uL (ref 0.1–1.0)
Monocytes Relative: 9 % (ref 3.0–12.0)
Neutro Abs: 5.4 10*3/uL (ref 1.4–7.7)
Neutrophils Relative %: 48.6 % (ref 43.0–77.0)
Platelets: 398 10*3/uL (ref 150.0–400.0)
RBC: 4.28 Mil/uL (ref 4.22–5.81)
RDW: 13.6 % (ref 11.5–15.5)
WBC: 11.2 10*3/uL — ABNORMAL HIGH (ref 4.0–10.5)

## 2020-08-04 LAB — LIPID PANEL
Cholesterol: 161 mg/dL (ref 0–200)
HDL: 48.1 mg/dL (ref >39.00)
LDL Cholesterol: 90 mg/dL (ref 0–99)
NonHDL: 112.79
Total CHOL/HDL Ratio: 3
Triglycerides: 113 mg/dL (ref 0.0–149.0)
VLDL: 22.6 mg/dL (ref 0.0–40.0)

## 2020-08-04 LAB — HEPATIC FUNCTION PANEL
ALT: 43 U/L (ref 0–53)
AST: 42 U/L — ABNORMAL HIGH (ref 0–37)
Albumin: 4.5 g/dL (ref 3.5–5.2)
Alkaline Phosphatase: 56 U/L (ref 39–117)
Bilirubin, Direct: 0.1 mg/dL (ref 0.0–0.3)
Total Bilirubin: 0.7 mg/dL (ref 0.2–1.2)
Total Protein: 7.1 g/dL (ref 6.0–8.3)

## 2020-08-04 LAB — BASIC METABOLIC PANEL
BUN: 25 mg/dL — ABNORMAL HIGH (ref 6–23)
CO2: 30 mEq/L (ref 19–32)
Calcium: 9.9 mg/dL (ref 8.4–10.5)
Chloride: 96 mEq/L (ref 96–112)
Creatinine, Ser: 1.13 mg/dL (ref 0.40–1.50)
GFR: 65.3 mL/min (ref >60.00)
Glucose, Bld: 96 mg/dL (ref 70–99)
Potassium: 3.5 mEq/L (ref 3.5–5.1)
Sodium: 136 mEq/L (ref 135–145)

## 2020-08-04 LAB — T3, FREE: T3, Free: 3.4 pg/mL (ref 2.3–4.2)

## 2020-08-04 LAB — T4, FREE: Free T4: 0.77 ng/dL (ref 0.60–1.60)

## 2020-08-04 LAB — HEMOGLOBIN A1C: Hgb A1c MFr Bld: 5.9 % (ref 4.6–6.5)

## 2020-08-04 LAB — TSH: TSH: 0.69 u[IU]/mL (ref 0.35–4.50)

## 2020-08-04 LAB — PSA: PSA: 2.79 ng/mL (ref 0.10–4.00)

## 2020-08-04 NOTE — Addendum Note (Signed)
Addended by: Elmer Picker on: 08/04/2020 09:14 AM   Modules accepted: Orders

## 2020-08-04 NOTE — Addendum Note (Signed)
Addended by: Wyvonne Lenz on: 08/04/2020 09:32 AM   Modules accepted: Orders

## 2020-08-04 NOTE — Progress Notes (Signed)
   Subjective:    Patient ID: Gabriel Aguilar, male    DOB: 10-Apr-1951, 70 y.o.   MRN: [id number]  HPI Here for a well exam. He feels fine. He is scheduled for a colonoscopy with Dr. Rush Landmark on May 19.    Review of Systems  Constitutional: Negative.   HENT: Negative.   Eyes: Negative.   Respiratory: Negative.   Cardiovascular: Negative.   Gastrointestinal: Negative.   Genitourinary: Negative.   Musculoskeletal: Negative.   Skin: Negative.   Neurological: Negative.   Psychiatric/Behavioral: Negative.        Objective:   Physical Exam Constitutional:      General: He is not in acute distress.    Appearance: Normal appearance. He is well-developed. He is not diaphoretic.  HENT:     Head: Normocephalic and atraumatic.     Right Ear: External ear normal.     Left Ear: External ear normal.     Nose: Nose normal.     Mouth/Throat:     Pharynx: No oropharyngeal exudate.  Eyes:     General: No scleral icterus.       Right eye: No discharge.        Left eye: No discharge.     Conjunctiva/sclera: Conjunctivae normal.     Pupils: Pupils are equal, round, and reactive to light.  Neck:     Thyroid: No thyromegaly.     Vascular: No JVD.     Trachea: No tracheal deviation.  Cardiovascular:     Rate and Rhythm: Normal rate and regular rhythm.     Heart sounds: Normal heart sounds. No murmur heard. No friction rub. No gallop.   Pulmonary:     Effort: Pulmonary effort is normal. No respiratory distress.     Breath sounds: Normal breath sounds. No wheezing or rales.  Chest:     Chest wall: No tenderness.  Abdominal:     General: Bowel sounds are normal. There is no distension.     Palpations: Abdomen is soft. There is no mass.     Tenderness: There is no abdominal tenderness. There is no guarding or rebound.  Genitourinary:    Penis: Normal. No tenderness.      Testes: Normal.     Prostate: Normal.     Rectum: Normal. Guaiac result negative.  Musculoskeletal:         General: No tenderness. Normal range of motion.     Cervical back: Neck supple.  Lymphadenopathy:     Cervical: No cervical adenopathy.  Skin:    General: Skin is warm and dry.     Coloration: Skin is not pale.     Findings: No erythema or rash.  Neurological:     Mental Status: He is alert and oriented to person, place, and time.     Cranial Nerves: No cranial nerve deficit.     Motor: No abnormal muscle tone.     Coordination: Coordination normal.     Deep Tendon Reflexes: Reflexes are normal and symmetric. Reflexes normal.  Psychiatric:        Behavior: Behavior normal.        Thought Content: Thought content normal.        Judgment: Judgment normal.           Assessment & Plan:  Well exam. We discussed diet and exercise. Get fasting labs.  Alysia Penna, MD

## 2020-08-23 ENCOUNTER — Telehealth: Payer: Self-pay | Admitting: Gastroenterology

## 2020-08-23 NOTE — Telephone Encounter (Signed)
Returned patients call and asked him to come pick up a sample of [place] from the office.Patient stated he would pick up prep today on his lunch break.

## 2020-08-26 ENCOUNTER — Ambulatory Visit (AMBULATORY_SURGERY_CENTER): Payer: PPO | Admitting: Gastroenterology

## 2020-08-26 ENCOUNTER — Other Ambulatory Visit: Payer: Self-pay

## 2020-08-26 ENCOUNTER — Encounter: Payer: Self-pay | Admitting: Gastroenterology

## 2020-08-26 VITALS — BP 116/58 | HR 58 | Temp 98.6°F | Resp 13 | Ht 68.0 in | Wt 208.0 lb

## 2020-08-26 DIAGNOSIS — Z86010 Personal history of colonic polyps: Secondary | ICD-10-CM

## 2020-08-26 DIAGNOSIS — D124 Benign neoplasm of descending colon: Secondary | ICD-10-CM

## 2020-08-26 DIAGNOSIS — I4891 Unspecified atrial fibrillation: Secondary | ICD-10-CM | POA: Diagnosis not present

## 2020-08-26 DIAGNOSIS — D127 Benign neoplasm of rectosigmoid junction: Secondary | ICD-10-CM | POA: Diagnosis not present

## 2020-08-26 DIAGNOSIS — D120 Benign neoplasm of cecum: Secondary | ICD-10-CM

## 2020-08-26 DIAGNOSIS — I2510 Atherosclerotic heart disease of native coronary artery without angina pectoris: Secondary | ICD-10-CM | POA: Diagnosis not present

## 2020-08-26 DIAGNOSIS — D122 Benign neoplasm of ascending colon: Secondary | ICD-10-CM | POA: Diagnosis not present

## 2020-08-26 DIAGNOSIS — D123 Benign neoplasm of transverse colon: Secondary | ICD-10-CM

## 2020-08-26 DIAGNOSIS — D128 Benign neoplasm of rectum: Secondary | ICD-10-CM | POA: Diagnosis not present

## 2020-08-26 DIAGNOSIS — G4733 Obstructive sleep apnea (adult) (pediatric): Secondary | ICD-10-CM | POA: Diagnosis not present

## 2020-08-26 HISTORY — PX: COLONOSCOPY: SHX174

## 2020-08-26 MED ORDER — HYDROCORTISONE ACETATE 25 MG RE SUPP: 25.0000 mg | Freq: Two times a day (BID) | RECTAL | 1 refills | Status: DC

## 2020-08-26 MED ORDER — HYDROCORTISONE ACETATE 25 MG RE SUPP: 25.0000 mg | Freq: Every evening | RECTAL | 1 refills | Status: DC

## 2020-08-26 MED ORDER — SODIUM CHLORIDE 0.9 % IV SOLN: 500.0000 mL | Freq: Once | INTRAVENOUS | Status: DC

## 2020-08-26 NOTE — Patient Instructions (Signed)
Handouts given on polyps and hemorrhoids  See physicians note on precedure report for instructions on restarting blood thinners  YOU HAD AN ENDOSCOPIC PROCEDURE TODAY AT [place]:   Refer to the procedure report that was given to you for any specific questions about what was found during the examination.  If the procedure report does not answer your questions, please call your gastroenterologist to clarify.  If you requested that your care partner not be given the details of your procedure findings, then the procedure report has been included in a sealed envelope for you to review at your convenience later.  YOU SHOULD EXPECT: Some feelings of bloating in the abdomen. Passage of more gas than usual.  Walking can help get rid of the air that was put into your GI tract during the procedure and reduce the bloating. If you had a lower endoscopy (such as a colonoscopy or flexible sigmoidoscopy) you may notice spotting of blood in your stool or on the toilet paper. If you underwent a bowel prep for your procedure, you may not have a normal bowel movement for a few days.  Please Note:  You might notice some irritation and congestion in your nose or some drainage.  This is from the oxygen used during your procedure.  There is no need for concern and it should clear up in a day or so.  SYMPTOMS TO REPORT IMMEDIATELY:   Following lower endoscopy (colonoscopy or flexible sigmoidoscopy):  Excessive amounts of blood in the stool  Significant tenderness or worsening of abdominal pains  Swelling of the abdomen that is new, acute  Fever of 100F or higher   Following upper endoscopy (EGD)  Vomiting of blood or coffee ground material  New chest pain or pain under the shoulder blades  Painful or persistently difficult swallowing  New shortness of breath  Fever of 100F or higher  Black, tarry-looking stools  For urgent or emergent issues, a gastroenterologist can be reached at any hour  by calling [phone]. Do not use MyChart messaging for urgent concerns.    DIET:  We do recommend a small meal at first, but then you may proceed to your regular diet.  Drink plenty of fluids but you should avoid alcoholic beverages for 24 hours.  ACTIVITY:  You should plan to take it easy for the rest of today and you should NOT DRIVE or use heavy machinery until tomorrow (because of the sedation medicines used during the test).    FOLLOW UP: Our staff will call the number listed on your records 48-72 hours following your procedure to check on you and address any questions or concerns that you may have regarding the information given to you following your procedure. If we do not reach you, we will leave a message.  We will attempt to reach you two times.  During this call, we will ask if you have developed any symptoms of COVID 19. If you develop any symptoms (ie: fever, flu-like symptoms, shortness of breath, cough etc.) before then, please call [phone].  If you test positive for Covid 19 in the 2 weeks post procedure, please call and report this information to [place].    If any biopsies were taken you will be contacted by phone or by letter within the next 1-3 weeks.  Please call us at [phone] if you have not heard about the biopsies in 3 weeks.    SIGNATURES/CONFIDENTIALITY: You and/or your care partner have signed paperwork which will  be entered into your electronic medical record.  These signatures attest to the fact that that the information above on your After Visit Summary has been reviewed and is understood.  Full responsibility of the confidentiality of this discharge information lies with you and/or your care-partner. 

## 2020-08-26 NOTE — Progress Notes (Signed)
Report given to PACU, vss 

## 2020-08-26 NOTE — Progress Notes (Signed)
Called to room to assist during endoscopic procedure.  Patient ID and intended procedure confirmed with present staff. Received instructions for my participation in the procedure from the performing physician.  

## 2020-08-26 NOTE — Progress Notes (Signed)
VS-NS 

## 2020-08-26 NOTE — Op Note (Signed)
[facility] Patient Name: Gabriel Aguilar Procedure Date: 08/26/2020 3:39 PM MRN: [id number] Endoscopist: Gabriel Mansouraty , MD Age: 69 Referring MD:  Date of Birth: 01/30/1951 Gender: Male Account #: [id number] Procedure:                Colonoscopy Indications:              Surveillance: Personal history of adenomatous                            polyps on last colonoscopy > 5 years ago Medicines:                Monitored Anesthesia Care Procedure:                Pre-Anesthesia Assessment:                           - Prior to the procedure, a History and Physical                            was performed, and patient medications and                            allergies were reviewed. The patient's tolerance of                            previous anesthesia was also reviewed. The risks                            and benefits of the procedure and the sedation                            options and risks were discussed with the patient.                            All questions were answered, and informed consent                            was obtained. Prior Anticoagulants: The patient                            last took Plavix (clopidogrel) 5 days and Xarelto                            (rivaroxaban) 3 days prior to the procedure. ASA                            Grade Assessment: III - A patient with severe                            systemic disease. After reviewing the risks and                            benefits, the patient was deemed in satisfactory                              condition to undergo the procedure.                           After obtaining informed consent, the colonoscope                            was passed under direct vision. Throughout the                            procedure, the patient's blood pressure, pulse, and                            oxygen saturations were monitored continuously. The                            Olympus CF-HQ190 [id number])  Colonoscope was                            introduced through the anus and advanced to the 5                            cm into the ileum. The colonoscopy was performed                            without difficulty. The patient tolerated the                            procedure. The quality of the bowel preparation was                            adequate. The terminal ileum, ileocecal valve,                            appendiceal orifice, and rectum were photographed. Scope In: 4:00:32 PM Scope Out: 4:25:45 PM Scope Withdrawal Time: 0 hours 20 minutes 48 seconds  Total Procedure Duration: 0 hours 25 minutes 13 seconds  Findings:                 The digital rectal exam findings include                            hemorrhoids. Pertinent negatives include no                            palpable rectal lesions.                           A moderate amount of semi-liquid stool was found in                            the entire colon, interfering with visualization.                            Lavage of the area was performed using copious  amounts, resulting in clearance with adequate                            visualization.                           The terminal ileum and ileocecal valve appeared                            normal.                           Seven sessile polyps were found in the rectum (1),                            recto-sigmoid colon (1), descending colon (1),                            transverse colon (1), ascending colon (2) and cecum                            (1). The polyps were 2 to 7 mm in size. These                            polyps were removed with a cold snare. Resection                            and retrieval were complete.                           Normal mucosa was found in the entire colon                            otherwise.                           Non-bleeding non-thrombosed external and internal                             hemorrhoids were found during retroflexion, during                            perianal exam and during digital exam. The                            hemorrhoids were Grade II (internal hemorrhoids                            that prolapse but reduce spontaneously). Complications:            No immediate complications. Estimated Blood Loss:     Estimated blood loss was minimal. Impression:               - Hemorrhoids found on digital rectal exam.                           -   Stool in the entire examined colon - lavaged with                            adequate visualization.                           - The examined portion of the ileum was normal.                           - Seven 2 to 7 mm polyps in the rectum, at the                            recto-sigmoid colon, in the descending colon, in                            the transverse colon, in the ascending colon and in                            the cecum, removed with a cold snare. Resected and                            retrieved.                           - Normal mucosa in the entire examined colon                            otherwise.                           - Non-bleeding non-thrombosed external and internal                            hemorrhoids. Recommendation:           - The patient will be observed post-procedure,                            until all discharge criteria are met.                           - Discharge patient to home.                           - Patient has a contact number available for                            emergencies. The signs and symptoms of potential                            delayed complications were discussed with the                            patient. Return to normal activities tomorrow.  Written discharge instructions were provided to the                            patient.                           - High fiber diet.                           - Use FiberCon 1-2  tablets PO daily.                           - Restart Xarelto in 48 hours (5/21).                           - Restart Plavix in 72 hours (5/22).                           - Continue present medications.                           - Await pathology results.                           - Repeat colonoscopy in 3 years for surveillance                            based on pathology results.                           - The findings and recommendations were discussed                            with the patient.                           - The findings and recommendations were discussed                            with the patient's family. Justice Britain, MD 08/26/2020 4:32:06 PM

## 2020-08-30 ENCOUNTER — Telehealth: Payer: Self-pay

## 2020-08-30 NOTE — Telephone Encounter (Signed)
  Follow up Call-  Call back number 08/26/2020  Post procedure Call Back phone  # [phone]  Permission to leave phone message Yes  Some recent data might be hidden     Patient questions:  Do you have a fever, pain , or abdominal swelling? No. Pain Score  0 *  Have you tolerated food without any problems? Yes.    Have you been able to return to your normal activities? Yes.    Do you have any questions about your discharge instructions: Diet   No. Medications  No. Follow up visit  No.  Do you have questions or concerns about your Care? No.  Actions: * If pain score is 4 or above: No action needed, pain <4.  1. Have you developed a fever since your procedure? no  2.   Have you had an respiratory symptoms (SOB or cough) since your procedure? no  3.   Have you tested positive for COVID 19 since your procedure no  4.   Have you had any family members/close contacts diagnosed with the COVID 19 since your procedure?  no   If yes to any of these questions please route to Joylene John, RN and Joella Prince, RN

## 2020-09-08 ENCOUNTER — Encounter: Payer: Self-pay | Admitting: Gastroenterology

## 2020-09-13 ENCOUNTER — Telehealth: Payer: Self-pay | Admitting: Pulmonary Disease

## 2020-09-13 NOTE — Telephone Encounter (Signed)
Called and spoke with Colletta Maryland who was calling from [facility] to see if tte order that was submitted is for a new cpap, as pt has had machine since 2017. Just trying to clarify. Went over with Colletta Maryland the last CPAP order from Dr Elsworth Soho was from 01/29/20 stating to "Change auto CPAP settings to 5 to 12 cm." Stephanie expressed understanding. Nothing further needed at this time.

## 2020-10-08 DIAGNOSIS — L570 Actinic keratosis: Secondary | ICD-10-CM | POA: Diagnosis not present

## 2020-10-08 DIAGNOSIS — L905 Scar conditions and fibrosis of skin: Secondary | ICD-10-CM | POA: Diagnosis not present

## 2020-10-08 DIAGNOSIS — L400 Psoriasis vulgaris: Secondary | ICD-10-CM | POA: Diagnosis not present

## 2020-10-08 DIAGNOSIS — L814 Other melanin hyperpigmentation: Secondary | ICD-10-CM | POA: Diagnosis not present

## 2020-10-08 DIAGNOSIS — D2239 Melanocytic nevi of other parts of face: Secondary | ICD-10-CM | POA: Diagnosis not present

## 2020-10-08 DIAGNOSIS — Z85828 Personal history of other malignant neoplasm of skin: Secondary | ICD-10-CM | POA: Diagnosis not present

## 2020-10-08 DIAGNOSIS — D1801 Hemangioma of skin and subcutaneous tissue: Secondary | ICD-10-CM | POA: Diagnosis not present

## 2020-10-08 DIAGNOSIS — L281 Prurigo nodularis: Secondary | ICD-10-CM | POA: Diagnosis not present

## 2020-10-08 DIAGNOSIS — D485 Neoplasm of uncertain behavior of skin: Secondary | ICD-10-CM | POA: Diagnosis not present

## 2020-10-08 DIAGNOSIS — D2271 Melanocytic nevi of right lower limb, including hip: Secondary | ICD-10-CM | POA: Diagnosis not present

## 2020-10-08 DIAGNOSIS — L738 Other specified follicular disorders: Secondary | ICD-10-CM | POA: Diagnosis not present

## 2020-10-08 DIAGNOSIS — L821 Other seborrheic keratosis: Secondary | ICD-10-CM | POA: Diagnosis not present

## 2020-10-08 DIAGNOSIS — C44329 Squamous cell carcinoma of skin of other parts of face: Secondary | ICD-10-CM | POA: Diagnosis not present

## 2020-10-13 DIAGNOSIS — Z20822 Contact with and (suspected) exposure to covid-19: Secondary | ICD-10-CM | POA: Diagnosis not present

## 2020-10-18 ENCOUNTER — Telehealth: Payer: Self-pay | Admitting: Pharmacist

## 2020-10-18 NOTE — Chronic Care Management (AMB) (Signed)
Chronic Care Management Pharmacy Assistant   Name: Gabriel Aguilar  MRN: [id number] DOB: Jul 12, 1950   Reason for Encounter: Medication Review  Recent office visits:  None  Recent consult visits:  05.19.2022 Mansouraty, Telford Nab. gastroenterology procedure visit patient was present for colonoscopy. 04.27.2022 Laurey Morale, MD patient was present for annual physical.  Hospital visits:  None in previous 6 months  Medications: Outpatient Encounter Medications as of 10/18/2020  Medication Sig Note   amLODipine (NORVASC) 10 MG tablet TAKE ONE TABLET BY MOUTH ONCE DAILY    Ascorbic Acid (VITAMIN C) 1000 MG tablet Take 1,000 mg by mouth daily.    atorvastatin (LIPITOR) 80 MG tablet TAKE ONE TABLET BY MOUTH EVERY EVENING AT 6pm    ciprofloxacin (CIPRO) 500 MG tablet TAKE ONE TABLET BY MOUTH TWICE DAILY    clopidogrel (PLAVIX) 75 MG tablet TAKE ONE TABLET BY MOUTH EVERY MORNING WITH BREAKFAST 08/26/2020: Last dose 08/21/20 for colonoscopy   fenofibrate 54 MG tablet TAKE ONE TABLET BY MOUTH ONCE DAILY    fluticasone (FLONASE) 50 MCG/ACT nasal spray Place 1 spray into both nostrils at bedtime.    hydrochlorothiazide (HYDRODIURIL) 25 MG tablet TAKE ONE TABLET BY MOUTH ONCE DAILY    hydrocortisone (ANUSOL-HC) 25 MG suppository Place 1 suppository (25 mg total) rectally every 12 (twelve) hours. Use 1 suppository nightly for 7 nights and then 1 every other night for the remainder of the suppositories    metoprolol tartrate (LOPRESSOR) 25 MG tablet Take 0.5 tablets (12.5 mg total) by mouth every 8 (eight) hours as needed (ELEVATED HEART RATE).    nitroGLYCERIN (NITROSTAT) 0.4 MG SL tablet Place 1 tablet (0.4 mg total) under the tongue every 5 (five) minutes as needed for chest pain.    OMEGA 3 1000 MG CAPS Take 1 capsule by mouth daily.     omeprazole (PRILOSEC) 40 MG capsule TAKE ONE CAPSULE BY MOUTH ONCE DAILY    XARELTO 20 MG TABS tablet TAKE ONE TABLET BY MOUTH ONCE DAILY WITH SUPPER  08/26/2020: Last dose 08/23/20 for colonoscopy   No facility-administered encounter medications on file as of 10/18/2020.   Reviewed chart for medication changes ahead of medication coordination call.  BP Readings from Last 3 Encounters:  08/26/20 (!) 116/58  08/04/20 140/78  07/12/20 (!) 148/63    Lab Results  Component Value Date   HGBA1C 5.9 08/04/2020    Patient obtains medications through Vials  90 Days  Last adherence delivery included:  Amlodipine 10 mg; one tab once daily Atorvastatin 80 mg; one tab every evening Clopidogrel 75 mg; one tab once a day Fenofibrate 54 mg; one tab once a day HCTZ 25 mg; one tab once a day Omeprazole 40 mg; one tab once a day Ipratropium (ATROVENT) 0.03 % nasal spray: at bedtime Nitroglycerin Sub 0.4 mg PRN   Patient declined the following medication last month due to additional supply on hand. Xarelto 20 mg; one tab once a day  Patient is due for next adherence delivery on: 07.22.2022 . Called patient and reviewed medications and coordinated delivery. This delivery to include: Omeprazole 40 mg; one tab once a day  Patient declined the following medications due to ( patient states he will call when needed) Amlodipine 10 mg; one tab once daily Atorvastatin 80 mg; one tab every evening Clopidogrel 75 mg; one tab once a day Fenofibrate 54 mg; one tab once a day HCTZ 25 mg; one tab once a day Ipratropium (ATROVENT) 0.03 % nasal spray: at  bedtime Nitroglycerin Sub 0.4 mg PRN Xarelto 20 mg; one tab once a day  Patient needs refills for none of his medication. Confirmed delivery date of 07.22.222, advised patient that pharmacy will contact them the morning of delivery.  Care Gaps: Zoster Vaccines Covid-19 4th booster  Star Rating Drugs:   Dispensed Quantity Pharmacy  Atorvastatin 80 mg [phone] [address]   Amilia Revonda Standard, [place] Pharmacist Assistant [phone]

## 2020-10-24 DIAGNOSIS — R350 Frequency of micturition: Secondary | ICD-10-CM | POA: Diagnosis not present

## 2020-10-24 DIAGNOSIS — U071 COVID-19: Secondary | ICD-10-CM | POA: Diagnosis not present

## 2020-10-24 DIAGNOSIS — R059 Cough, unspecified: Secondary | ICD-10-CM | POA: Diagnosis not present

## 2020-10-25 ENCOUNTER — Telehealth (INDEPENDENT_AMBULATORY_CARE_PROVIDER_SITE_OTHER): Payer: PPO | Admitting: Family Medicine

## 2020-10-25 ENCOUNTER — Encounter: Payer: Self-pay | Admitting: Family Medicine

## 2020-10-25 VITALS — BP 139/57 | HR 77 | Temp 97.9°F | Ht 68.0 in | Wt 208.0 lb

## 2020-10-25 DIAGNOSIS — U071 COVID-19: Secondary | ICD-10-CM | POA: Diagnosis not present

## 2020-10-25 MED ORDER — NIRMATRELVIR/RITONAVIR (PAXLOVID)TABLET: 3.0000 | ORAL_TABLET | Freq: Two times a day (BID) | ORAL | 0 refills | Status: AC

## 2020-10-25 NOTE — Progress Notes (Signed)
Subjective:    Patient ID: Gabriel Aguilar, male    DOB: 1950/08/23, 70 y.o.   MRN: [id number]  HPI Virtual Visit via Video Note  I connected with the patient on 10/25/20 at  3:45 PM EDT by a video enabled telemedicine application and verified that I am speaking with the correct person using two identifiers.  Location patient: home Location provider:work or home office Persons participating in the virtual visit: patient, provider  I discussed the limitations of evaluation and management by telemedicine and the availability of in person appointments. The patient expressed understanding and agreed to proceed.   HPI: Here for a Covid-19 infection. On 10-14-20 he developed a dry cough and body aches. The cough became worse and he developed a fever to 101 degrees. No SOB or ST. No NVD. On 10-23-20 he tested positive for the Covid virus at home. Then on 10-24-20 he went to a [facility] urgent care and was started on 10 days of Doxycycline. He is using Delsym and drinking fluids. He asked to be start on an antiviral medication while there, and they told him he needed to see his PCP for that. Today he still feels the same with fevers and a dry cough. He has been quarantining at home.    ROS: See pertinent positives and negatives per HPI.  Past Medical History:  Diagnosis Date   Arthritis    Coronary artery disease    Erectile dysfunction    GERD (gastroesophageal reflux disease)    Hiatal hernia    History of pericarditis    HTN (hypertension)    Hyperlipidemia LDL goal <70    Hypogonadism male    has seen  Dr. Orland Mustard   Palpitation    a. montior 2 weeks 2014--> no arrhythmia   Paroxysmal atrial fibrillation ([place])    Sleep apnea, central    wears a CPAP at night     Past Surgical History:  Procedure Laterality Date   BACK SURGERY  1994   L4-5   CARDIAC CATHETERIZATION     COLONOSCOPY  07-09-12   per Dr. 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    [place]   disc at L4-L5   VASECTOMY      Family History  Problem Relation Age of Onset   Hyperlipidemia Father 20       KIDNEY FAILURE   Heart disease Father    Hypertension Father    Kidney disease Father    Diabetes Father    Heart disease Mother 5   Other Sister        LIVER FAILURE   CVA Brother    Other Sister        AT BIRTH   Heart attack Sister    CVA Brother    Other Brother        SEVERAL CABG   Colon cancer Neg Hx      Current Outpatient Medications:  amLODipine (NORVASC) 10 MG tablet, TAKE ONE TABLET BY MOUTH ONCE DAILY, Disp: 90 tablet, Rfl: 1   Ascorbic Acid (VITAMIN C) 1000 MG tablet, Take 1,000 mg by mouth daily., Disp: , Rfl:    atorvastatin (LIPITOR) 80 MG tablet, TAKE ONE TABLET BY MOUTH EVERY EVENING AT 6pm, Disp: 90 tablet, Rfl: 3   ciprofloxacin (CIPRO) 500 MG tablet, TAKE ONE TABLET BY MOUTH TWICE DAILY, Disp: 20 tablet, Rfl: 0   clopidogrel (PLAVIX) 75 MG tablet, TAKE ONE TABLET BY MOUTH EVERY MORNING WITH BREAKFAST, Disp: 90 tablet, Rfl: 1   doxycycline (VIBRAMYCIN) 100 MG capsule, Take by mouth., Disp: , Rfl:    fenofibrate 54 MG tablet, TAKE ONE TABLET BY MOUTH ONCE DAILY, Disp: 90 tablet, Rfl: 3   fluticasone (FLONASE) 50 MCG/ACT nasal spray, Place 1 spray into both nostrils at bedtime., Disp: , Rfl:    hydrochlorothiazide (HYDRODIURIL) 25 MG tablet, TAKE ONE TABLET BY MOUTH ONCE DAILY, Disp: 90 tablet, Rfl: 3   hydrocortisone (ANUSOL-HC) 25 MG suppository,  Place 1 suppository (25 mg total) rectally every 12 (twelve) hours. Use 1 suppository nightly for 7 nights and then 1 every other night for the remainder of the suppositories, Disp: 12 suppository, Rfl: 1   nirmatrelvir/ritonavir EUA (PAXLOVID) TABS, Take 3 tablets by mouth 2 (two) times daily for 5 days. (Take nirmatrelvir 150 mg two tablets twice daily for 5 days and ritonavir 100 mg one tablet twice daily for 5 days) Patient GFR is 65, Disp: 30 tablet, Rfl: 0   nitroGLYCERIN (NITROSTAT) 0.4 MG SL tablet, Place 1 tablet (0.4 mg total) under the tongue every 5 (five) minutes as needed for chest pain., Disp: 25 tablet, Rfl: 1   OMEGA 3 1000 MG CAPS, Take 1 capsule by mouth daily. , Disp: , Rfl:    omeprazole (PRILOSEC) 40 MG capsule, TAKE ONE CAPSULE BY MOUTH ONCE DAILY, Disp: 90 capsule, Rfl: 1   XARELTO 20 MG TABS tablet, TAKE ONE TABLET BY MOUTH ONCE DAILY WITH SUPPER, Disp: 90 tablet, Rfl: 1   metoprolol tartrate (LOPRESSOR) 25 MG tablet, Take 0.5 tablets (12.5 mg total) by mouth every 8 (eight) hours as needed (ELEVATED HEART RATE)., Disp: 45 tablet, Rfl: 3  EXAM:  VITALS per patient if applicable:  GENERAL: alert, oriented, appears well and in no acute distress  HEENT: atraumatic, conjunttiva clear, no obvious abnormalities on inspection of external nose and ears  NECK: normal movements of the head and neck  LUNGS: on inspection no signs of respiratory distress, breathing rate appears normal, no obvious gross SOB, gasping or wheezing  CV: no obvious cyanosis  MS: moves all visible extremities without noticeable abnormality  PSYCH/NEURO: pleasant and cooperative, no obvious depression or anxiety, speech and thought processing grossly intact  ASSESSMENT AND PLAN: Covid-19 infection. We will treat him with 5 days of Paxlovid. He will take this along with the Doxycycline. Recheck as needed.  Alysia Penna, MD  Discussed the following assessment and plan:  No diagnosis found.     I  discussed the assessment and treatment plan with the patient. The patient was provided an opportunity to ask questions and all were answered. The patient agreed with the plan and demonstrated an understanding of the instructions.   The patient was advised to call back or seek an in-person evaluation if the symptoms worsen or if the condition fails to improve as anticipated.      Review of Systems     Objective:   Physical Exam  Assessment & Plan:

## 2020-10-29 ENCOUNTER — Other Ambulatory Visit: Payer: Self-pay | Admitting: Cardiology

## 2020-11-05 DIAGNOSIS — G4733 Obstructive sleep apnea (adult) (pediatric): Secondary | ICD-10-CM | POA: Diagnosis not present

## 2020-11-09 ENCOUNTER — Telehealth: Payer: Self-pay | Admitting: Pharmacist

## 2020-11-09 NOTE — Progress Notes (Signed)
    Chronic Care Management Pharmacy Assistant   Name: Gabriel Aguilar  MRN: [id number] DOB: 1950-11-03  11-09-2020 Per no fill report contacted patient in reference to his Fenofibrate '54mg'$ , patient advised he has at least 20 pills left in his vial and will not need a fill for at least 20 days, updated spreadsheet.  [place] Clinical Pharmacist Assistant [phone]   Medications: Outpatient Encounter Medications as of 11/09/2020  Medication Sig Note   amLODipine (NORVASC) 10 MG tablet TAKE ONE TABLET BY MOUTH ONCE DAILY    Ascorbic Acid (VITAMIN C) 1000 MG tablet Take 1,000 mg by mouth daily.    atorvastatin (LIPITOR) 80 MG tablet TAKE ONE TABLET BY MOUTH EVERY EVENING AT 6pm    ciprofloxacin (CIPRO) 500 MG tablet TAKE ONE TABLET BY MOUTH TWICE DAILY    clopidogrel (PLAVIX) 75 MG tablet TAKE ONE TABLET BY MOUTH EVERY MORNING WITH BREAKFAST 08/26/2020: Last dose 08/21/20 for colonoscopy   fenofibrate 54 MG tablet TAKE ONE TABLET BY MOUTH ONCE DAILY    fluticasone (FLONASE) 50 MCG/ACT nasal spray Place 1 spray into both nostrils at bedtime.    hydrochlorothiazide (HYDRODIURIL) 25 MG tablet TAKE ONE TABLET BY MOUTH ONCE DAILY    hydrocortisone (ANUSOL-HC) 25 MG suppository Place 1 suppository (25 mg total) rectally every 12 (twelve) hours. Use 1 suppository nightly for 7 nights and then 1 every other night for the remainder of the suppositories    metoprolol tartrate (LOPRESSOR) 25 MG tablet Take 0.5 tablets (12.5 mg total) by mouth every 8 (eight) hours as needed (ELEVATED HEART RATE).    nitroGLYCERIN (NITROSTAT) 0.4 MG SL tablet Place 1 tablet (0.4 mg total) under the tongue every 5 (five) minutes as needed for chest pain.    OMEGA 3 1000 MG CAPS Take 1 capsule by mouth daily.     omeprazole (PRILOSEC) 40 MG capsule TAKE ONE CAPSULE BY MOUTH ONCE DAILY    XARELTO 20 MG TABS tablet TAKE ONE TABLET BY MOUTH ONCE DAILY WITH SUPPER 08/26/2020: Last dose 08/23/20 for colonoscopy   No  facility-administered encounter medications on file as of 11/09/2020.

## 2020-11-12 DIAGNOSIS — C44329 Squamous cell carcinoma of skin of other parts of face: Secondary | ICD-10-CM | POA: Diagnosis not present

## 2020-12-01 ENCOUNTER — Telehealth: Payer: Self-pay | Admitting: Pharmacist

## 2020-12-01 NOTE — Chronic Care Management (AMB) (Addendum)
Chronic Care Management Pharmacy Assistant   Name: Gabriel Aguilar  MRN: [id number] DOB: 01-May-1950  Reason for Encounter: Medication Review Medication Coordination Call    Recent office visits:  10-25-2020 Laurey Morale, MD - Patient presented for COVID infection. Prescribed Nirmatrelvir/ritonavir EUA.  Recent consult visits:  10-24-2020 Glean Salvo, PA-C ([place]) - Patient presented for Cough.No medication changes.   Hospital visits:  None in previous 6 months  Medications: Outpatient Encounter Medications as of 12/01/2020  Medication Sig Note   amLODipine (NORVASC) 10 MG tablet TAKE ONE TABLET BY MOUTH ONCE DAILY    Ascorbic Acid (VITAMIN C) 1000 MG tablet Take 1,000 mg by mouth daily.    atorvastatin (LIPITOR) 80 MG tablet TAKE ONE TABLET BY MOUTH EVERY EVENING AT 6pm    ciprofloxacin (CIPRO) 500 MG tablet TAKE ONE TABLET BY MOUTH TWICE DAILY    clopidogrel (PLAVIX) 75 MG tablet TAKE ONE TABLET BY MOUTH EVERY MORNING WITH BREAKFAST 08/26/2020: Last dose 08/21/20 for colonoscopy   fenofibrate 54 MG tablet TAKE ONE TABLET BY MOUTH ONCE DAILY    fluticasone (FLONASE) 50 MCG/ACT nasal spray Place 1 spray into both nostrils at bedtime.    hydrochlorothiazide (HYDRODIURIL) 25 MG tablet TAKE ONE TABLET BY MOUTH ONCE DAILY    hydrocortisone (ANUSOL-HC) 25 MG suppository Place 1 suppository (25 mg total) rectally every 12 (twelve) hours. Use 1 suppository nightly for 7 nights and then 1 every other night for the remainder of the suppositories    metoprolol tartrate (LOPRESSOR) 25 MG tablet Take 0.5 tablets (12.5 mg total) by mouth every 8 (eight) hours as needed (ELEVATED HEART RATE).    nitroGLYCERIN (NITROSTAT) 0.4 MG SL tablet Place 1 tablet (0.4 mg total) under the tongue every 5 (five) minutes as needed for chest pain.    OMEGA 3 1000 MG CAPS Take 1 capsule by mouth daily.     omeprazole (PRILOSEC) 40 MG capsule TAKE ONE CAPSULE BY MOUTH ONCE DAILY    XARELTO 20  MG TABS tablet TAKE ONE TABLET BY MOUTH ONCE DAILY WITH SUPPER 08/26/2020: Last dose 08/23/20 for colonoscopy   No facility-administered encounter medications on file as of 12/01/2020.  Reviewed chart for medication changes ahead of medication coordination call.  No OVs, Consults, or hospital visits since last care coordination call/Pharmacist visit.   No medication changes indicated. BP Readings from Last 3 Encounters:  10/25/20 (!) 139/57  08/26/20 (!) 116/58  08/04/20 140/78    Lab Results  Component Value Date   HGBA1C 5.9 08/04/2020     Patient obtains medications through Adherence Packaging  90 Days   Last adherence delivery included: None Patient declined all Patient declined the following medications due to ( patient states he will call when needed) Amlodipine 10 mg; one tab once daily Atorvastatin 80 mg; one tab every evening Clopidogrel 75 mg; one tab once a day Fenofibrate 54 mg; one tab once a day HCTZ 25 mg; one tab once a day Ipratropium (ATROVENT) 0.03 % nasal spray: at bedtime Nitroglycerin Sub 0.4 mg PRN Xarelto 20 mg; one tab once a day     Patient is due for next adherence delivery on: 12-07-2020.  Called patient and reviewed medications and coordinated delivery.  Patient confirmed vials for 90 DS  This delivery to include: HCTZ 25 mg; one tab once a day Fenofibrate 54 mg; one tab once a day Clopidogrel 75 mg; one tab once a day Amlodipine 10 mg; one tab once daily Atorvastatin 80  mg; one tab every evening   Patient declined the following medications  Omeprazole 40 mg: one capsule daily - filled last month Xarelto 20 mg: one tablet daily (supply on hand)  Confirmed delivery date of 12-07-2020, advised patient that pharmacy will contact him the morning of delivery.   Care Gaps: Zoster Vaccine - Overdue Covid Booster#4 Therapist, music) - Overdue Flu Vaccine - Overdue CCM F/U Call - 01-21-21 8:30 AWV - MSG sent to Ramond Craver CMA to schedule.   Star  Rating Drugs: Atorvastatin 80 mg - Last filled 07-29-2020 90 DS at [facility] (declined last delivery)   [facility] Pharmacist Assistant [phone]

## 2020-12-03 ENCOUNTER — Other Ambulatory Visit: Payer: Self-pay | Admitting: Cardiology

## 2020-12-24 ENCOUNTER — Telehealth: Payer: Self-pay | Admitting: Cardiology

## 2020-12-24 NOTE — Telephone Encounter (Signed)
Spoke to patient stated he is in [place] visiting.Stated his heart went out of rhythm all day yesterday.He did not notice beating fast just irregular.He felt sob,no energy most of day.Stated he feels better this morning heart has gone back in rhythm.He did take a extra Metoprolol yesterday.Stated he has been drinking a lot of coffee.He will decrease.

## 2020-12-24 NOTE — Telephone Encounter (Signed)
Patient c/o Palpitations:  High priority if patient c/o lightheadedness, shortness of breath, or chest pain  How long have you had palpitations/irregular HR/ Afib? Are you having the symptoms now?  Since 9/10pm yesterday   Are you currently experiencing lightheadedness, SOB or CP? Shortness of breath and fatigue  Do you have a history of afib (atrial fibrillation) or irregular heart rhythm? yes  Have you checked your BP or HR? (document readings if available): no but says pulse is irregular  Are you experiencing any other symptoms? no

## 2021-01-14 DIAGNOSIS — H35013 Changes in retinal vascular appearance, bilateral: Secondary | ICD-10-CM | POA: Diagnosis not present

## 2021-01-14 DIAGNOSIS — H35363 Drusen (degenerative) of macula, bilateral: Secondary | ICD-10-CM | POA: Diagnosis not present

## 2021-01-14 DIAGNOSIS — H25013 Cortical age-related cataract, bilateral: Secondary | ICD-10-CM | POA: Diagnosis not present

## 2021-01-14 DIAGNOSIS — H2513 Age-related nuclear cataract, bilateral: Secondary | ICD-10-CM | POA: Diagnosis not present

## 2021-01-14 DIAGNOSIS — H524 Presbyopia: Secondary | ICD-10-CM | POA: Diagnosis not present

## 2021-01-20 ENCOUNTER — Telehealth: Payer: Self-pay | Admitting: Pharmacist

## 2021-01-20 NOTE — Chronic Care Management (AMB) (Signed)
    Chronic Care Management Pharmacy Assistant   Name: Gabriel Aguilar  MRN: [id number] DOB: 1950/07/31  01/20/21 APPOINTMENT REMINDER   Called [facility], No answer, left message of appointment on 01/21/21 at 8:30 via telephone visit with Jeni Salles, Pharm D. Notified to have all medications, supplements, blood pressure and/or blood sugar logs available during appointment and to return call if need to reschedule.  Care Gaps: BP- 124/74 AWV- Office aware to schedule Zoster Vaccine - Overdue COVID Booster #4 Therapist, music) - Overdue Flu Vaccine- Overdue  Star Rating Drug: Atorvastatin (Lipitor) 80 mg - Last filled 12/03/20 90 DS at [facility]  Any gaps in medications fill history? None  Medications: Outpatient Encounter Medications as of 01/20/2021  Medication Sig Note   amLODipine (NORVASC) 10 MG tablet TAKE ONE TABLET BY MOUTH ONCE DAILY    Ascorbic Acid (VITAMIN C) 1000 MG tablet Take 1,000 mg by mouth daily.    atorvastatin (LIPITOR) 80 MG tablet TAKE ONE TABLET BY MOUTH EVERY EVENING AT 6pm    ciprofloxacin (CIPRO) 500 MG tablet TAKE ONE TABLET BY MOUTH TWICE DAILY    clopidogrel (PLAVIX) 75 MG tablet TAKE ONE TABLET BY MOUTH EVERY MORNING WITH BREAKFAST    fenofibrate 54 MG tablet TAKE ONE TABLET BY MOUTH ONCE DAILY    fluticasone (FLONASE) 50 MCG/ACT nasal spray Place 1 spray into both nostrils at bedtime.    hydrochlorothiazide (HYDRODIURIL) 25 MG tablet TAKE ONE TABLET BY MOUTH ONCE DAILY    hydrocortisone (ANUSOL-HC) 25 MG suppository Place 1 suppository (25 mg total) rectally every 12 (twelve) hours. Use 1 suppository nightly for 7 nights and then 1 every other night for the remainder of the suppositories    metoprolol tartrate (LOPRESSOR) 25 MG tablet Take 0.5 tablets (12.5 mg total) by mouth every 8 (eight) hours as needed (ELEVATED HEART RATE).    nitroGLYCERIN (NITROSTAT) 0.4 MG SL tablet Place 1 tablet (0.4 mg total) under the tongue every 5 (five) minutes as needed for  chest pain.    OMEGA 3 1000 MG CAPS Take 1 capsule by mouth daily.     omeprazole (PRILOSEC) 40 MG capsule TAKE ONE CAPSULE BY MOUTH ONCE DAILY    XARELTO 20 MG TABS tablet TAKE ONE TABLET BY MOUTH ONCE DAILY WITH SUPPER 08/26/2020: Last dose 08/23/20 for colonoscopy   No facility-administered encounter medications on file as of 01/20/2021.    [facility] Clinical Pharmacist Assistant [phone]

## 2021-01-21 ENCOUNTER — Ambulatory Visit (INDEPENDENT_AMBULATORY_CARE_PROVIDER_SITE_OTHER): Payer: PPO | Admitting: Pharmacist

## 2021-01-21 DIAGNOSIS — I10 Essential (primary) hypertension: Secondary | ICD-10-CM

## 2021-01-21 DIAGNOSIS — E785 Hyperlipidemia, unspecified: Secondary | ICD-10-CM

## 2021-01-21 NOTE — Progress Notes (Signed)
Chronic Care Management Pharmacy Note  01/21/2021 Name:  Gabriel Aguilar MRN:  [id number] DOB:  [birth year]  Summary: BP at goal < 130/80 per home readings LDL not at goal < 70   Recommendations/Changes made from today's visit: -Recommend rechecking lipid panel and consider starting Zetia if LDL > 70 -Recommended discussion with dermatology to avoid drug interaction with niacin to switch for a topical   Plan: Follow up BP assessment in 2-3 months  Subjective: Gabriel Aguilar is an 70 y.o. year old male who is a primary patient of Nelwyn Salisbury, MD.  The CCM team was consulted for assistance with disease management and care coordination needs.    Engaged with patient by telephone for follow up visit in response to provider referral for pharmacy case management and/or care coordination services.   Consent to Services:  The patient was given information about Chronic Care Management services, agreed to services, and gave verbal consent prior to initiation of services.  Please see initial visit note for detailed documentation.   Patient Care Team: Nelwyn Salisbury, MD as PCP - General (Family Medicine) Rollene Rotunda, MD as PCP - Cardiology (Cardiology) Hillis Range, MD as PCP - Electrophysiology (Cardiology) Oretha Milch, MD as Consulting Physician (Pulmonary Disease) Magrinat, Valentino Hue, MD as Consulting Physician (Oncology) Verner Chol, [facility] as Pharmacist (Pharmacist)  Recent office visits: 10/25/20 Gershon Crane, MD: Patient presented for video visit for COVID 19 infection. Prescribed Paxlovid x 5 days.  08/04/20 Gershon Crane, MD: Patient presented for well exam and fasting labs.  03/17/20 Theodora Blow, LPN: Patient presented for AWV.  Recent consult visits: 11/12/20 Lucretia Field (dermatology): Unable to access notes.  10/08/20 Olive Bass (dermatopathology): Unable to access notes.  08/26/20 Endo: Patient presented for colonoscopy.   07/12/20 Ruthann Cancer, MD (oncology):  Patient presented for follow up for T-cell lymphocytosis.   07/02/20 Doug Sou, MD (gastro): Patient presented for colonoscopy prep.  06/11/20 Rollene Rotunda, MD (cardiology): Patient presented for palpitations. Patient drank wine and too much caffeine and went into Afib. No changes made.  02/25/20 Jaclyn Prime PA (dermatology): Patient presented for follow up. Unable to access notes.  01/29/20 Cyril Mourning, MD (pulmonology): Patient presented for OSA follow up. Prescribed ipratropium bromide and trial of Claritin/Zyrtec for nasal congestion to decrease Afrin use. Continue CPAP use for 4-6 hours per night.   Hospital visits: 10/24/20 Patient presented to [facility] express care for COVID infection. Prescribed doxycycline.  Objective:  Lab Results  Component Value Date   CREATININE 1.13 08/04/2020   BUN 25 (H) 08/04/2020   GFR 65.30 08/04/2020   GFRNONAA >60 05/30/2018   GFRAA >60 05/30/2018   NA 136 08/04/2020   K 3.5 08/04/2020   CALCIUM 9.9 08/04/2020   CO2 30 08/04/2020   GLUCOSE 96 08/04/2020    Lab Results  Component Value Date/Time   HGBA1C 5.9 08/04/2020 09:14 AM   HGBA1C 5.7 06/06/2019 10:28 AM   GFR 65.30 08/04/2020 09:14 AM   GFR 66.08 06/06/2019 10:28 AM    Last diabetic Eye exam: No results found for: HMDIABEYEEXA  Last diabetic Foot exam: No results found for: HMDIABFOOTEX   Lab Results  Component Value Date   CHOL 161 08/04/2020   HDL 48.10 08/04/2020   LDLCALC 90 08/04/2020   LDLDIRECT 97.0 05/14/2018   TRIG 113.0 08/04/2020   CHOLHDL 3 08/04/2020    Hepatic Function Latest Ref Rng & Units 08/04/2020 05/14/2018 05/11/2015  Total Protein 6.0 - 8.3 g/dL 7.1 6.5  6.7  Albumin 3.5 - 5.2 g/dL 4.5 4.4 4.2  AST 0 - 37 U/L 42(H) 22 32  ALT 0 - 53 U/L 43 33 36  Alk Phosphatase 39 - 117 U/L 56 60 63  Total Bilirubin 0.2 - 1.2 mg/dL 0.7 0.6 0.7  Bilirubin, Direct 0.0 - 0.3 mg/dL 0.1 0.1 -    Lab Results  Component Value Date/Time   TSH 0.69 08/04/2020  09:14 AM   TSH 0.91 06/06/2019 10:28 AM   FREET4 0.77 08/04/2020 09:14 AM    CBC Latest Ref Rng & Units 08/04/2020 07/12/2020 06/30/2019  WBC 4.0 - 10.5 K/uL 11.2(H) 11.9(H) 10.9(H)  Hemoglobin 13.0 - 17.0 g/dL 13.7 13.6 13.5  Hematocrit 39.0 - 52.0 % 38.9(L) 39.3 39.5  Platelets 150.0 - 400.0 K/uL 398.0 376 390    No results found for: VD25OH  Clinical ASCVD: Yes  The ASCVD Risk score (Arnett DK, et al., 2019) failed to calculate for the following reasons:   The patient has a prior MI or stroke diagnosis    Depression screen [facility] 2/9 08/04/2020 03/17/2020 07/24/2019  Decreased Interest 0 0 0  Down, Depressed, Hopeless 0 0 0  PHQ - 2 Score 0 0 0  Altered sleeping 0 0 -  Tired, decreased energy 1 0 -  Change in appetite 0 0 -  Feeling bad or failure about yourself  0 0 -  Trouble concentrating 0 0 -  Moving slowly or fidgety/restless 0 0 -  Suicidal thoughts 0 0 -  PHQ-9 Score 1 0 -  Difficult doing work/chores Not difficult at all Not difficult at all -     CHA2DS2/VAS Stroke Risk Points  Current as of 4 hours ago     3 >= 2 Points: High Risk  1 - 1.99 Points: Medium Risk  0 Points: Low Risk    Last Change: N/A      Details    This score determines the patient's risk of having a stroke if the  patient has atrial fibrillation.       Points Metrics  0 Has Congestive Heart Failure:  No    Current as of 4 hours ago  1 Has Vascular Disease:  Yes    Current as of 4 hours ago  1 Has Hypertension:  Yes    Current as of 4 hours ago  1 Age:  4    Current as of 4 hours ago  0 Has Diabetes:  No    Current as of 4 hours ago  0 Had Stroke:  No  Had TIA:  No  Had Thromboembolism:  No    Current as of 4 hours ago  0 Male:  No    Current as of 4 hours ago       Social History   Tobacco Use  Smoking Status Former   Packs/day: 1.50   Types: Cigarettes   Start date: 04/10/1964   Quit date: 03/06/1983   Years since quitting: 37.9  Smokeless Tobacco Never   BP Readings  from Last 3 Encounters:  10/25/20 (!) 139/57  08/26/20 (!) 116/58  08/04/20 140/78   Pulse Readings from Last 3 Encounters:  10/25/20 77  08/26/20 (!) 58  08/04/20 65   Wt Readings from Last 3 Encounters:  10/25/20 208 lb (94.3 kg)  08/26/20 208 lb (94.3 kg)  08/04/20 202 lb 12.8 oz (92 kg)   BMI Readings from Last 3 Encounters:  10/25/20 31.63 kg/m  08/26/20 31.63 kg/m  08/04/20 30.84  kg/m    Assessment/Interventions: Review of patient past medical history, allergies, medications, health status, including review of consultants reports, laboratory and other test data, was performed as part of comprehensive evaluation and provision of chronic care management services.   SDOH:  (Social Determinants of Health) assessments and interventions performed: No  SDOH Screenings   Alcohol Screen: Low Risk    Last Alcohol Screening Score (AUDIT): 1  Depression (PHQ2-9): Low Risk    PHQ-2 Score: 1  Financial Resource Strain: Low Risk    Difficulty of Paying Living Expenses: Not hard at all  Food Insecurity: No Food Insecurity   Worried About Charity fundraiser in the Last Year: Never true   Ran Out of Food in the Last Year: Never true  Housing: Low Risk    Last Housing Risk Score: 0  Physical Activity: Sufficiently Active   Days of Exercise per Week: 7 days   Minutes of Exercise per Session: 40 min  Social Connections: Moderately Integrated   Frequency of Communication with Friends and Family: More than three times a week   Frequency of Social Gatherings with Friends and Family: More than three times a week   Attends Religious Services: More than 4 times per year   Active Member of [employer] or Organizations: No   Attends Archivist Meetings: Never   Marital Status: Married  Stress: No Stress Concern Present   Feeling of Stress : Not at all  Tobacco Use: Medium Risk   Smoking Tobacco Use: Former   Smokeless Tobacco Use: Never  Transportation Needs: No Doctor, hospital (Medical): No   Lack of Transportation (Non-Medical): No    CCM Care Plan  No Known Allergies  Medications Reviewed Today     Reviewed by Viona Gilmore, [facility] (Pharmacist) on 01/21/21 at 0850  Med List Status: <None>   Medication Order Taking? Sig Documenting Provider Last Dose Status Informant  amLODipine (NORVASC) 10 MG tablet [id number]  TAKE ONE TABLET BY MOUTH ONCE DAILY [name], MD  Active   Ascorbic Acid (VITAMIN C) 1000 MG tablet [id number]  Take 1,000 mg by mouth daily. [provider]  Active Self  atorvastatin (LIPITOR) 80 MG tablet [id number] Yes TAKE ONE TABLET BY MOUTH EVERY EVENING AT Hortencia Conradi, MD Taking Active   b complex vitamins capsule [id number] Yes Take 1 capsule by mouth daily. [provider] Taking Active   clopidogrel (PLAVIX) 75 MG tablet [id number]  TAKE ONE TABLET BY MOUTH EVERY MORNING WITH Nettie Elm, MD  Active   fenofibrate 54 MG tablet [id number]  TAKE ONE TABLET BY MOUTH ONCE DAILY [name], MD  Active   fluticasone (FLONASE) 50 MCG/ACT nasal spray [id number]  Place 1 spray into both nostrils at bedtime. [provider]  Active Self  hydrochlorothiazide (HYDRODIURIL) 25 MG tablet [id number]  TAKE ONE TABLET BY MOUTH ONCE DAILY [name], MD  Active   hydrocortisone (ANUSOL-HC) 25 MG suppository [id number]  Place 1 suppository (25 mg total) rectally every 12 (twelve) hours. Use 1 suppository nightly for 7 nights and then 1 every other night for the remainder of the suppositories Mansouraty, Telford Nab., MD  Active   metoprolol tartrate (LOPRESSOR) 25 MG tablet [id number]  Take 0.5 tablets (12.5 mg total) by mouth every 8 (eight) hours as needed (ELEVATED HEART RATE). [name], MD  Expired 12/21/19 2359   niacin 500 MG tablet [id number] Yes Take 500 mg by mouth at bedtime. [provider] Taking Active   nitroGLYCERIN (NITROSTAT) 0.4 MG SL tablet  [id number]  Place 1 tablet (0.4 mg total) under the tongue every 5 (five) minutes as needed for chest pain. 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   Antiplatelet or antithrombotic long-term use 09/16/2018   Coronary artery disease involving native coronary artery of native heart without angina pectoris 09/16/2018   Dyslipidemia 09/16/2018   Essential hypertension 09/16/2018   Educated about COVID-19 virus infection 09/16/2018   Ankle edema, bilateral 07/17/2018   Status post coronary artery stent placement    NSTEMI (non-ST elevated myocardial infarction) ([place])    Elevated troponin 05/28/2018   Prostatitis 07/05/2015   BPH (benign prostatic hyperplasia) 07/01/2015   Dysuria 07/01/2015   PAF (paroxysmal atrial fibrillation) (HCC)    Acute pericarditis    Second degree AV block    Chest pain 05/11/2015   GERD (gastroesophageal reflux disease) 05/11/2015   Persistent lymphocytosis 05/11/2015   Single kidney 05/11/2015   Hyperlipidemia 05/11/2015   Other and unspecified hyperlipidemia 06/02/2013   OSA (obstructive sleep apnea) 03/05/2013    Immunization History  Administered Date(s) Administered   Fluad Quad(high Dose 65+) 03/17/2020   Influenza Split 02/22/2011   PFIZER(Purple Top)SARS-COV-2 Vaccination 11/05/2019, 12/05/2019, 07/31/2020   Pneumococcal Conjugate-13 05/13/2018    Pneumococcal Polysaccharide-23 08/04/2020   Tdap 08/04/2020   Patient recently recovered from [place] and hasn't noticed any long lasting symptoms. He also recently just started going back to the gym every day as of this week and has been walking on the treadmill and lifting weights.  Patient did note some episodes of irregular heart rates when he visited [place] but this has since resolved.   Conditions to be addressed/monitored:  Hypertension, Hyperlipidemia, Atrial Fibrillation, Coronary Artery Disease and GERD  Conditions addressed this visit: Hypertension, Hyperlipidemia  Care Plan : [place]  Updates made by Viona Gilmore, [place] since 01/21/2021 12:00 AM     Problem: Problem:  HTN, HLD, Paroxysmal Afib, CAD/NSTEMI, GERD      Long-Range Goal: Patient-Specific Goal   Start Date: 07/26/2020  Expected End Date: 07/26/2021  Recent Progress: On track  Priority: High  Note:   Current Barriers:  Unable to independently monitor therapeutic efficacy Unable to achieve control of cholesterol   Pharmacist Clinical Goal(s):  Patient will achieve adherence to monitoring guidelines and medication adherence to achieve therapeutic efficacy achieve control of cholesterol as evidenced by next lipid panel  through collaboration with PharmD and provider.   Interventions: 1:1 collaboration with Laurey Morale, MD regarding development and update of comprehensive plan of care as evidenced by provider attestation and co-signature Inter-disciplinary care team collaboration (see longitudinal plan of care) Comprehensive medication review performed; medication list updated in electronic medical record  Hypertension (BP  goal <130/80) -Controlled (per home readings) -Current treatment: Hydrochlorothiazide 25mg , 1 tablet once daily  Amlodipine 10mg , 1 tablet once daily -Medications previously tried: none  -Current home readings: 125/65 HR 65, 120/60 HR 65 -Current dietary habits:  tries to limit salt intake -Current exercise habits: just started back at the gym this week -Denies hypotensive/hypertensive symptoms -Educated on Exercise goal of 150 minutes per week; Importance of home blood pressure monitoring; Proper BP monitoring technique; -Counseled to monitor BP at home weekly, document, and provide log at future appointments -Counseled on diet and exercise extensively Recommended to continue current medication Recommended checking BP at least minimally when feeling dizzy/lightheaded.  Hyperlipidemia: (LDL goal < 70) -Uncontrolled -Current treatment: Atorvastatin 80mg , 1 tablet once daily at 6 pm Fenofibrate 54mg , 1 tablet once daily -Medications previously tried: fish oil (patient stopped on his own) -Current dietary patterns: does a lot of baking and grilling; oatmeal for breakfast; has been eating out more and more red meat than usual -Current exercise habits: goes to the gym every day and uses the treadmill some days and weights most days -Educated on Cholesterol goals;  Importance of limiting foods high in cholesterol; Exercise goal of 150 minutes per week; -Counseled on diet and exercise extensively Recommended to continue current medication Recommended follow up with cardiology to recheck cholesterol  Atrial Fibrillation (Goal: prevent stroke and major bleeding) -Controlled -CHADSVASC: 3 -Current treatment: Rate control: metoprolol as needed 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) -Home BP and HR readings: does not check regularly  -Counseled on bleeding risk associated with Xarelto and importance of self-monitoring for signs/symptoms of bleeding; avoidance of NSAIDs due to increased bleeding risk with anticoagulants; -Recommended to continue current medication  CAD/history of MI (Goal: prevent future heart attacks and strokes) -Controlled -Current treatment   Clopidogrel 75mg , 1 tablet once daily  Nitroglycerin 0.4mg , 1 tablet under tongue as needed for chest pain -Medications previously tried: none  -Recommended to continue current medication  GERD (Goal: minimize symptoms of acid reflux/heartburn) -Controlled -Current treatment  Omeprazole 40mg , 1 capsule once daily -Medications previously tried: none  -Counseled on non-pharmacological interventions for acid reflux. Take measures to prevent acid reflux, such as avoiding spicy foods, avoiding caffeine, avoid laying down a few hours after eating, and raising the head of the bed.  Allergic rhinitis (Goal: minimize symptoms of allergies) -Uncontrolled -Current treatment  Afrin only as needed (works through the night) Fluticasone 14mcg/act nasal spray, 1 spray into both nostrils at bedtime - not taking Claritin 10 mg 1 tablet at night  -Medications previously tried: ipratropium (ineffective)  -Counseled on limiting use of Afrin to 3 consecutive days due to rebound congestion and increased BP Recommended trial of Flonase to prevent overuse of Afrin and can consider switching to alternative antihistamine such as Zyrtec or Allegra if Claritin is not helping   Health Maintenance -Vaccine gaps: Pneumovax or Prevnar 20, shingles, tetanus -Current therapy:  Ascorbic acid (Vitamin C) 1000mg , 1 tablet once daily (recommended to drink plenty water with it)  Vitamin B complex daily Weider support prostate health daily Niacin 500 mg daily (for skin) -Educated on Cost vs benefit of each product must be carefully weighed by individual consumer -Patient is satisfied with current therapy and denies issues -Recommended to continue current medication  Patient Goals/Self-Care Activities Patient will:  - take medications as prescribed check blood pressure weekly, document, and provide at future appointments target a minimum of 150 minutes of moderate intensity  exercise weekly  Follow Up Plan: Telephone  follow up appointment with care management team member scheduled for: 6 months        Medication Assistance: None required.  Patient affirms current coverage meets needs.  Compliance/Adherence/Medication fill history: Care Gaps: COVID booster, shingrix, influenza   Star-Rating Drugs: Atorvastatin (Lipitor) 80 mg - Last filled 12/03/20 90 DS at [facility]  Patient's preferred pharmacy is:  [facility] - [place], [place] - [address] [place] [address] Phone: [phone] Fax: [phone]  [id number] - [place], [place] [address] Phone: [phone] Fax: [place] [address], [place] - [place] [address] [place] [address] Phone: [phone] Fax: [phone]  Uses pill box? Yes Pt endorses 100% compliance  We discussed: Benefits of medication synchronization, packaging and delivery as well as enhanced pharmacist oversight with [facility]. Patient decided to: Utilize [facility] pharmacy for medication synchronization, packaging and delivery  Care Plan and Follow Up Patient Decision:  Patient agrees to Care Plan and Follow-up.  Plan: Telephone follow up appointment with care management team member scheduled for:  6 months  Jeni Salles, PharmD [place] Pharmacist [place] at [place] [phone]

## 2021-01-21 NOTE — Patient Instructions (Addendum)
Hi Hezzie,  It was great to catch up with you! Please try to keep an eye out on those episodes of dizziness/lightheadedness when it comes to your blood pressure to make sure it's not dropping too low.  Also don't forget to discuss the use of niacin with your skin doctor at your next visit to try to avoid a potential drug interaction with your cholesterol medication. See if a topical version would work just as well for what you are using it for.  Please reach out to me if you have any questions or need anything before our follow up!  Best, Maddie  Jeni Salles, PharmD, [facility]   Visit Information   Goals Addressed   None    Patient Care Plan: CCM Pharmacy Care Plan     Problem Identified: Problem:  HTN, HLD, Paroxysmal Afib, CAD/NSTEMI, GERD      Long-Range Goal: Patient-Specific Goal   Start Date: 07/26/2020  Expected End Date: 07/26/2021  Recent Progress: On track  Priority: High  Note:   Current Barriers:  Unable to independently monitor therapeutic efficacy Unable to achieve control of cholesterol   Pharmacist Clinical Goal(s):  Patient will achieve adherence to monitoring guidelines and medication adherence to achieve therapeutic efficacy achieve control of cholesterol as evidenced by next lipid panel  through collaboration with PharmD and provider.   Interventions: 1:1 collaboration with Laurey Morale, MD regarding development and update of comprehensive plan of care as evidenced by provider attestation and co-signature Inter-disciplinary care team collaboration (see longitudinal plan of care) Comprehensive medication review performed; medication list updated in electronic medical record  Hypertension (BP goal <130/80) -Controlled (per home readings) -Current treatment: Hydrochlorothiazide 25mg , 1 tablet once daily  Amlodipine 10mg , 1 tablet once daily -Medications previously tried: none   -Current home readings: 125/65 HR 65, 120/60 HR 65 -Current dietary habits: tries to limit salt intake -Current exercise habits: just started back at the gym this week -Denies hypotensive/hypertensive symptoms -Educated on Exercise goal of 150 minutes per week; Importance of home blood pressure monitoring; Proper BP monitoring technique; -Counseled to monitor BP at home weekly, document, and provide log at future appointments -Counseled on diet and exercise extensively Recommended to continue current medication Recommended checking BP at least minimally when feeling dizzy/lightheaded.  Hyperlipidemia: (LDL goal < 70) -Uncontrolled -Current treatment: Atorvastatin 80mg , 1 tablet once daily at 6 pm Fenofibrate 54mg , 1 tablet once daily -Medications previously tried: fish oil (patient stopped on his own) -Current dietary patterns: does a lot of baking and grilling; oatmeal for breakfast; has been eating out more and more red meat than usual -Current exercise habits: goes to the gym every day and uses the treadmill some days and weights most days -Educated on Cholesterol goals;  Importance of limiting foods high in cholesterol; Exercise goal of 150 minutes per week; -Counseled on diet and exercise extensively Recommended to continue current medication Recommended follow up with cardiology to recheck cholesterol  Atrial Fibrillation (Goal: prevent stroke and major bleeding) -Controlled -CHADSVASC: 3 -Current treatment: Rate control: metoprolol as needed 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) -Home BP and HR readings: does not check regularly  -Counseled on bleeding risk associated with Xarelto and importance of self-monitoring for signs/symptoms of bleeding; avoidance of NSAIDs due to increased bleeding risk with anticoagulants; -Recommended to continue current medication  CAD/history of MI (Goal: prevent  future heart attacks and strokes) -Controlled -Current treatment  Clopidogrel 75mg , 1 tablet once daily  Nitroglycerin 0.4mg , 1 tablet under tongue as needed for chest pain -Medications previously tried: none  -Recommended to continue current medication  GERD (Goal: minimize symptoms of acid reflux/heartburn) -Controlled -Current treatment  Omeprazole 40mg , 1 capsule once daily -Medications previously tried: none  -Counseled on non-pharmacological interventions for acid reflux. Take measures to prevent acid reflux, such as avoiding spicy foods, avoiding caffeine, avoid laying down a few hours after eating, and raising the head of the bed.  Allergic rhinitis (Goal: minimize symptoms of allergies) -Uncontrolled -Current treatment  Afrin only as needed (works through the night) Fluticasone 17mcg/act nasal spray, 1 spray into both nostrils at bedtime - not taking Claritin 10 mg 1 tablet at night  -Medications previously tried: ipratropium (ineffective)  -Counseled on limiting use of Afrin to 3 consecutive days due to rebound congestion and increased BP Recommended trial of Flonase to prevent overuse of Afrin and can consider switching to alternative antihistamine such as Zyrtec or Allegra if Claritin is not helping   Health Maintenance -Vaccine gaps: Pneumovax or Prevnar 20, shingles, tetanus -Current therapy:  Ascorbic acid (Vitamin C) 1000mg , 1 tablet once daily (recommended to drink plenty water with it)  Vitamin B complex daily Weider support prostate health daily Niacin 500 mg daily (for skin) -Educated on Cost vs benefit of each product must be carefully weighed by individual consumer -Patient is satisfied with current therapy and denies issues -Recommended to continue current medication  Patient Goals/Self-Care Activities Patient will:  - take medications as prescribed check blood pressure weekly, document, and provide at future appointments target a minimum of 150 minutes  of moderate intensity exercise weekly  Follow Up Plan: Telephone follow up appointment with care management team member scheduled for: 6 months       Patient verbalizes understanding of instructions provided today and agrees to view in [place].  Telephone follow up appointment with pharmacy team member scheduled for: 6 months  Viona Gilmore, [facility]

## 2021-02-02 DIAGNOSIS — G4733 Obstructive sleep apnea (adult) (pediatric): Secondary | ICD-10-CM | POA: Diagnosis not present

## 2021-02-07 DIAGNOSIS — E785 Hyperlipidemia, unspecified: Secondary | ICD-10-CM | POA: Diagnosis not present

## 2021-02-07 DIAGNOSIS — I10 Essential (primary) hypertension: Secondary | ICD-10-CM | POA: Diagnosis not present

## 2021-02-21 ENCOUNTER — Other Ambulatory Visit: Payer: Self-pay | Admitting: Cardiology

## 2021-02-22 ENCOUNTER — Telehealth (INDEPENDENT_AMBULATORY_CARE_PROVIDER_SITE_OTHER): Payer: PPO | Admitting: Family Medicine

## 2021-02-22 ENCOUNTER — Telehealth: Payer: Self-pay | Admitting: Pharmacist

## 2021-02-22 ENCOUNTER — Telehealth: Payer: Self-pay | Admitting: Cardiology

## 2021-02-22 ENCOUNTER — Encounter: Payer: Self-pay | Admitting: Family Medicine

## 2021-02-22 VITALS — Temp 98.0°F

## 2021-02-22 DIAGNOSIS — R0981 Nasal congestion: Secondary | ICD-10-CM

## 2021-02-22 DIAGNOSIS — R059 Cough, unspecified: Secondary | ICD-10-CM | POA: Diagnosis not present

## 2021-02-22 MED ORDER — BENZONATATE 100 MG PO CAPS: ORAL_CAPSULE | ORAL | 0 refills | Status: DC

## 2021-02-22 MED ORDER — AMOXICILLIN-POT CLAVULANATE 875-125 MG PO TABS: 1.0000 | ORAL_TABLET | Freq: Two times a day (BID) | ORAL | 0 refills | Status: DC

## 2021-02-22 MED ORDER — METOPROLOL TARTRATE 25 MG PO TABS: 12.5000 mg | ORAL_TABLET | Freq: Three times a day (TID) | ORAL | 3 refills | Status: AC | PRN

## 2021-02-22 MED ORDER — HYDROCHLOROTHIAZIDE 25 MG PO TABS: 25.0000 mg | ORAL_TABLET | Freq: Every day | ORAL | 1 refills | Status: DC

## 2021-02-22 NOTE — Telephone Encounter (Signed)
Medications refilled

## 2021-02-22 NOTE — Patient Instructions (Signed)
-  I sent the medication(s) we discussed to your pharmacy: Meds ordered this encounter  Medications   benzonatate (TESSALON PERLES) 100 MG capsule    Sig: 1-2 capsules up to twice daily as needed for cough    Dispense:  30 capsule    Refill:  0   amoxicillin-clavulanate (AUGMENTIN) 875-125 MG tablet    Sig: Take 1 tablet by mouth 2 (two) times daily.    Dispense:  20 tablet    Refill:  0   Try nasal saline twice daily and the cough medication first.  I hope you are feeling better soon!  Seek in person care promptly if your symptoms worsen, new concerns arise or you are not improving with treatment.  It was nice to meet you today. I help [place] out with telemedicine visits on Tuesdays and Thursdays and am available for visits on those days. If you have any concerns or questions following this visit please schedule a follow up visit with your Primary Care doctor or seek care at a local urgent care clinic to avoid delays in care.

## 2021-02-22 NOTE — Chronic Care Management (AMB) (Signed)
Chronic Care Management Pharmacy Assistant   Name: Gabriel Aguilar  MRN: [id number] DOB: [birth year]  Reason for Encounter: Disease State and Medication Review   Conditions to be addressed/monitored: HTN  Recent office visits:  None  Recent consult visits:  None  Hospital visits:  None in previous 6 months  Medications: Outpatient Encounter Medications as of 02/22/2021  Medication Sig Note   amLODipine (NORVASC) 10 MG tablet TAKE ONE TABLET BY MOUTH ONCE DAILY    Ascorbic Acid (VITAMIN C) 1000 MG tablet Take 1,000 mg by mouth daily.    atorvastatin (LIPITOR) 80 MG tablet TAKE ONE TABLET BY MOUTH EVERY EVENING AT 6pm    b complex vitamins capsule Take 1 capsule by mouth daily.    clopidogrel (PLAVIX) 75 MG tablet TAKE ONE TABLET BY MOUTH EVERY MORNING WITH BREAKFAST    fenofibrate 54 MG tablet TAKE ONE TABLET BY MOUTH ONCE DAILY    fluticasone (FLONASE) 50 MCG/ACT nasal spray Place 1 spray into both nostrils at bedtime.    hydrochlorothiazide (HYDRODIURIL) 25 MG tablet TAKE ONE TABLET BY MOUTH ONCE DAILY    hydrocortisone (ANUSOL-HC) 25 MG suppository Place 1 suppository (25 mg total) rectally every 12 (twelve) hours. Use 1 suppository nightly for 7 nights and then 1 every other night for the remainder of the suppositories    metoprolol tartrate (LOPRESSOR) 25 MG tablet Take 0.5 tablets (12.5 mg total) by mouth every 8 (eight) hours as needed (ELEVATED HEART RATE).    niacin 500 MG tablet Take 500 mg by mouth at bedtime.    nitroGLYCERIN (NITROSTAT) 0.4 MG SL tablet Place 1 tablet (0.4 mg total) under the tongue every 5 (five) minutes as needed for chest pain.    omeprazole (PRILOSEC) 40 MG capsule TAKE ONE CAPSULE BY MOUTH ONCE DAILY    XARELTO 20 MG TABS tablet TAKE ONE TABLET BY MOUTH ONCE DAILY WITH SUPPER 08/26/2020: Last dose 08/23/20 for colonoscopy   No facility-administered encounter medications on file as of 02/22/2021.  Reviewed chart prior to disease state call. Spoke  with patient regarding BP  Recent Office Vitals: BP Readings from Last 3 Encounters:  10/25/20 (!) 139/57  08/26/20 (!) 116/58  08/04/20 140/78   Pulse Readings from Last 3 Encounters:  10/25/20 77  08/26/20 (!) 58  08/04/20 65    Wt Readings from Last 3 Encounters:  10/25/20 208 lb (94.3 kg)  08/26/20 208 lb (94.3 kg)  08/04/20 202 lb 12.8 oz (92 kg)     Kidney Function Lab Results  Component Value Date/Time   CREATININE 1.13 08/04/2020 09:14 AM   CREATININE 1.10 06/06/2019 10:28 AM   CREATININE 1.18 02/10/2016 01:13 PM   GFR 65.30 08/04/2020 09:14 AM   GFRNONAA >60 05/30/2018 01:47 AM   GFRAA >60 05/30/2018 01:47 AM    BMP Latest Ref Rng & Units 08/04/2020 06/06/2019 06/03/2018  Glucose 70 - 99 mg/dL 96 95 93  BUN 6 - 23 mg/dL 25(H) 18 22  Creatinine 0.40 - 1.50 mg/dL 1.13 1.10 1.39  Sodium 135 - 145 mEq/L 136 137 137  Potassium 3.5 - 5.1 mEq/L 3.5 3.5 4.3  Chloride 96 - 112 mEq/L 96 98 100  CO2 19 - 32 mEq/L 30 33(H) 29  Calcium 8.4 - 10.5 mg/dL 9.9 10.0 9.8    Current antihypertensive regimen:  Hydrochlorothiazide 25mg , 1 tablet once daily  Amlodipine 10mg , 1 tablet once daily How often are you checking your Blood Pressure? infrequently Current home BP readings: 150/68 patient reported he  is not feeling well has had a runny nose, sore throat and chills for a few days. Advised he would call Gabriel Aguilar office when we hung up to schedule an appointment. Notified him I would check with him in about 2 weeks for more readings. What recent interventions/DTPs have been made by any provider to improve Blood Pressure control since last CPP Visit: Patient reports no changes Any recent hospitalizations or ED visits since last visit with CPP? No  Adherence Review: Is the patient currently on ACE/ARB medication? No Does the patient have >5 day gap between last estimated fill dates? No  Reviewed chart for medication changes ahead of medication coordination call.  No OVs,  Consults, or hospital visits since last care coordination call/Pharmacist visit. (If appropriate, list visit date, provider name)  No medication changes indicated OR if recent visit, treatment plan here.  BP Readings from Last 3 Encounters:  10/25/20 (!) 139/57  08/26/20 (!) 116/58  08/04/20 140/78    Lab Results  Component Value Date   HGBA1C 5.9 08/04/2020     Patient obtains medications through Vials  90 Days   Last adherence delivery included: (medication name and frequency) HCTZ 25 mg; one tab once a day Fenofibrate 54 mg; one tab once a day Clopidogrel 75 mg; one tab once a day Amlodipine 10 mg; one tab once daily Atorvastatin 80 mg; one tab every evening     Patient declined the following medications  Omeprazole 40 mg: one capsule daily - filled last month Xarelto 20 mg: one tablet daily (supply on hand)     Patient is due for next adherence delivery on: 03/04/21. (Patient requested we change to 11/28) Called patient and reviewed medications and coordinated delivery. Confirmed Vials With Safety Caps 90 DS   This delivery to include: HCTZ 25 mg; one tab once a day (Call to Gabriel Aguilar and requested script for Upstream today also) Fenofibrate 54 mg; one tab once a day Clopidogrel 75 mg; one tab once a day Amlodipine 10 mg; one tab once daily Atorvastatin 80 mg; one tab every evening Omeprazole 40 mg: one capsule daily - filled last month   Added refills for Metoprolol 25 mg.(Call to Gabriel Aguilar and requested script for Upstream today also)  Confirmed delivery date of 03/07/21, advised patient that pharmacy will contact them the morning of delivery.    Care Gaps: Zoster Vaccine - Overdue Covid Booster#4 Therapist, music) - Overdue Flu Vaccine - Overdue CCM F/U Call - 01-21-21 8:30 AWV -office aware as of 8/22 to schedule BP- 150/68 (over phone today/ will check with him in 2 weeks)  Star Rating Drugs: Atorvastatin 80 mg - Last filled 12/03/2020 90 DS at [place] Pharmacist Assistant [phone]

## 2021-02-22 NOTE — Telephone Encounter (Signed)
  [facility] called back to make additional refill request  *STAT* If patient is at the pharmacy, call can be transferred to refill team.   1. Which medications need to be refilled? (please list name of each medication and dose if known)   metoprolol tartrate (LOPRESSOR) 25 MG tablet     2. Which pharmacy/location (including street and city if local pharmacy) is medication to be sent to? [place] [address]  3. Do they need a 30 day or 90 day supply? 90 days

## 2021-02-22 NOTE — Telephone Encounter (Signed)
Request Filled

## 2021-02-22 NOTE — Progress Notes (Signed)
Virtual Visit via Video Note  I connected with Gabriel Aguilar  on 02/22/21 at  3:20 PM EST by a video enabled telemedicine application and verified that I am speaking with the correct person using two identifiers.  Location patient: home, [place] Location provider:work or home office Persons participating in the virtual visit: patient, provider  I discussed the limitations of evaluation and management by telemedicine and the availability of in person appointments. The patient expressed understanding and agreed to proceed.   HPI:  Acute telemedicine visit for cough and congestion: -Onset:about 5 days ago, has chronic nasal congestion at baseline -wife had been sick before him -Symptoms include: sore throat, drainage, cough, did have some chills, sinus congestion - thick nasal congestion some blood  -Denies:CP, SOB, NVD, body aches,  -Pertinent past medical history: see below -Pertinent medication allergies: No Known Allergies -COVID-19 vaccine status: Immunization History  Administered Date(s) Administered   Fluad Quad(high Dose 65+) 03/17/2020   Influenza Split 02/22/2011   PFIZER(Purple Top)SARS-COV-2 Vaccination 11/05/2019, 12/05/2019, 07/31/2020   Pneumococcal Conjugate-13 05/13/2018   Pneumococcal Polysaccharide-23 08/04/2020   Tdap 08/04/2020     ROS: See pertinent positives and negatives per HPI.  Past Medical History:  Diagnosis Date   Arthritis    Coronary artery disease    Erectile dysfunction    GERD (gastroesophageal reflux disease)    Hiatal hernia    History of pericarditis    HTN (hypertension)    Hyperlipidemia LDL goal <70    Hypogonadism male    has seen  Dr. Orland Mustard   Palpitation    a. montior 2 weeks 2014--> no arrhythmia   Paroxysmal atrial fibrillation ([place])    Sleep apnea, central    wears a CPAP at night     Past Surgical History:  Procedure Laterality Date   BACK SURGERY  1994   L4-5   CARDIAC CATHETERIZATION     COLONOSCOPY  07-09-12   per Dr.  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    [place]   disc at L4-L5   VASECTOMY       Current Outpatient Medications:    amoxicillin-clavulanate (AUGMENTIN) 875-125 MG tablet, Take 1 tablet by mouth 2 (two) times daily., Disp: 20 tablet, Rfl: 0   benzonatate (TESSALON PERLES) 100 MG capsule, 1-2 capsules up to twice daily as needed for cough, Disp: 30 capsule, Rfl: 0   OVER THE COUNTER MEDICATION, Nasal spray as needed, Disp: , Rfl:    amLODipine (NORVASC) 10 MG tablet, TAKE ONE TABLET BY MOUTH ONCE DAILY, Disp: 90 tablet, Rfl: 1   Ascorbic Acid (VITAMIN C) 1000 MG tablet, Take 1,000 mg by mouth daily., Disp: , Rfl:    atorvastatin (LIPITOR) 80 MG tablet, TAKE ONE TABLET BY MOUTH EVERY EVENING AT 6pm, Disp: 90 tablet, Rfl: 3   b complex vitamins capsule, Take 1 capsule by mouth daily., Disp: , Rfl:    clopidogrel (PLAVIX) 75 MG tablet,  TAKE ONE TABLET BY MOUTH EVERY MORNING WITH BREAKFAST, Disp: 90 tablet, Rfl: 1   fenofibrate 54 MG tablet, TAKE ONE TABLET BY MOUTH ONCE DAILY, Disp: 90 tablet, Rfl: 3   hydrochlorothiazide (HYDRODIURIL) 25 MG tablet, Take 1 tablet (25 mg total) by mouth daily., Disp: 90 tablet, Rfl: 1   metoprolol tartrate (LOPRESSOR) 25 MG tablet, Take 0.5 tablets (12.5 mg total) by mouth every 8 (eight) hours as needed (ELEVATED HEART RATE)., Disp: 45 tablet, Rfl: 3   niacin 500 MG tablet, Take 500 mg by mouth at bedtime., Disp: , Rfl:    nitroGLYCERIN  (NITROSTAT) 0.4 MG SL tablet, Place 1 tablet (0.4 mg total) under the tongue every 5 (five) minutes as needed for chest pain., Disp: 25 tablet, Rfl: 1   omeprazole (PRILOSEC) 40 MG capsule, TAKE ONE CAPSULE BY MOUTH ONCE DAILY (Patient taking differently: as needed.), Disp: 90 capsule, Rfl: 1   XARELTO 20 MG TABS tablet, TAKE ONE TABLET BY MOUTH ONCE DAILY WITH SUPPER, Disp: 90 tablet, Rfl: 1  EXAM:  VITALS per patient if applicable:  GENERAL: alert, oriented, appears well and in no acute distress  HEENT: atraumatic, conjunttiva clear, no obvious abnormalities on inspection of external nose and ears  NECK: normal movements of the head and neck  LUNGS: on inspection no signs of respiratory distress, breathing rate appears normal, no obvious gross SOB, gasping or wheezing  CV: no obvious cyanosis  MS: moves all visible extremities without noticeable abnormality  PSYCH/NEURO: pleasant and cooperative, no obvious depression or anxiety, speech and thought processing grossly intact  ASSESSMENT AND PLAN:  Discussed the following assessment and plan:  Nasal congestion  Cough, unspecified type  -we discussed possible serious and likely etiologies, options for evaluation and workup, limitations of telemedicine visit vs in person visit, treatment, treatment risks and precautions. Pt is agreeable to treatment via telemedicine at this moment. Query VURI, developing sinusitis, covid19 vs other. He has opted for trial nasal saline and Tessalon with delayed abx if worsening or not improving. HE agrees to home self covid test. Advised to seek prompt VV follow up or in person care if worsening, new symptoms arise, or if is not improving with treatment.  I discussed the assessment and treatment plan with the patient. The patient was provided an opportunity to ask questions and all were answered. The patient agreed with the plan and demonstrated an understanding of the instructions.     Lucretia Kern,  DO

## 2021-02-22 NOTE — Telephone Encounter (Signed)
*  STAT* If patient is at the pharmacy, call can be transferred to refill team.   1. Which medications need to be refilled? (please list name of each medication and dose if known) hydrochlorothiazide (HYDRODIURIL) 25 MG tablet  2. Which pharmacy/location (including street and city if local pharmacy) is medication to be sent to?  [place] [address]  3. Do they need a 30 day or 90 day supply? 90 ds

## 2021-02-25 ENCOUNTER — Other Ambulatory Visit: Payer: Self-pay | Admitting: Cardiology

## 2021-03-04 ENCOUNTER — Other Ambulatory Visit: Payer: Self-pay | Admitting: Cardiology

## 2021-03-07 NOTE — Progress Notes (Signed)
Call to patient to check back in with him on his blood pressures, he reports he is feeling much better and his last reading was 126/60, he reports it fluctuates a bit dependant on his activity level. He reports no other concerns at this time.    [place] Clinical Pharmacist Assistant [phone]

## 2021-03-14 ENCOUNTER — Telehealth: Payer: Self-pay | Admitting: Family Medicine

## 2021-03-14 NOTE — Telephone Encounter (Signed)
Left message for patient to call back and schedule Medicare Annual Wellness Visit (AWV) either virtually or in office. Left  my Gabriel Aguilar number [phone]   Last AWV 03/17/20 please schedule at anytime with [facility] Nurse Health Advisor 1 or 2   This should be a 45 minute visit.

## 2021-03-23 ENCOUNTER — Other Ambulatory Visit: Payer: Self-pay

## 2021-03-23 ENCOUNTER — Emergency Department (HOSPITAL_BASED_OUTPATIENT_CLINIC_OR_DEPARTMENT_OTHER)
Admission: EM | Admit: 2021-03-23 | Discharge: 2021-03-23 | Disposition: A | Discharge status: Home / Self Care | Payer: PPO | Attending: Emergency Medicine | Admitting: Emergency Medicine

## 2021-03-23 ENCOUNTER — Emergency Department (HOSPITAL_BASED_OUTPATIENT_CLINIC_OR_DEPARTMENT_OTHER): Payer: PPO

## 2021-03-23 ENCOUNTER — Encounter (HOSPITAL_BASED_OUTPATIENT_CLINIC_OR_DEPARTMENT_OTHER): Payer: Self-pay | Admitting: Emergency Medicine

## 2021-03-23 DIAGNOSIS — R21 Rash and other nonspecific skin eruption: Secondary | ICD-10-CM | POA: Diagnosis present

## 2021-03-23 DIAGNOSIS — Z7902 Long term (current) use of antithrombotics/antiplatelets: Secondary | ICD-10-CM | POA: Insufficient documentation

## 2021-03-23 DIAGNOSIS — I4891 Unspecified atrial fibrillation: Secondary | ICD-10-CM | POA: Insufficient documentation

## 2021-03-23 DIAGNOSIS — D72829 Elevated white blood cell count, unspecified: Secondary | ICD-10-CM | POA: Insufficient documentation

## 2021-03-23 DIAGNOSIS — I2510 Atherosclerotic heart disease of native coronary artery without angina pectoris: Secondary | ICD-10-CM | POA: Diagnosis not present

## 2021-03-23 DIAGNOSIS — K760 Fatty (change of) liver, not elsewhere classified: Secondary | ICD-10-CM | POA: Diagnosis not present

## 2021-03-23 DIAGNOSIS — B029 Zoster without complications: Secondary | ICD-10-CM | POA: Diagnosis not present

## 2021-03-23 DIAGNOSIS — Z7901 Long term (current) use of anticoagulants: Secondary | ICD-10-CM | POA: Insufficient documentation

## 2021-03-23 DIAGNOSIS — R109 Unspecified abdominal pain: Secondary | ICD-10-CM

## 2021-03-23 DIAGNOSIS — K5900 Constipation, unspecified: Secondary | ICD-10-CM

## 2021-03-23 DIAGNOSIS — Z87891 Personal history of nicotine dependence: Secondary | ICD-10-CM | POA: Diagnosis not present

## 2021-03-23 DIAGNOSIS — Z8616 Personal history of COVID-19: Secondary | ICD-10-CM | POA: Insufficient documentation

## 2021-03-23 DIAGNOSIS — K219 Gastro-esophageal reflux disease without esophagitis: Secondary | ICD-10-CM | POA: Insufficient documentation

## 2021-03-23 DIAGNOSIS — Z955 Presence of coronary angioplasty implant and graft: Secondary | ICD-10-CM | POA: Insufficient documentation

## 2021-03-23 DIAGNOSIS — Z79899 Other long term (current) drug therapy: Secondary | ICD-10-CM | POA: Insufficient documentation

## 2021-03-23 DIAGNOSIS — B0239 Other herpes zoster eye disease: Secondary | ICD-10-CM | POA: Diagnosis not present

## 2021-03-23 DIAGNOSIS — R1032 Left lower quadrant pain: Secondary | ICD-10-CM | POA: Diagnosis not present

## 2021-03-23 LAB — URINALYSIS, ROUTINE W REFLEX MICROSCOPIC
Bilirubin Urine: NEGATIVE
Glucose, UA: NEGATIVE mg/dL
Ketones, ur: NEGATIVE mg/dL
Leukocytes,Ua: NEGATIVE
Nitrite: NEGATIVE
Specific Gravity, Urine: 1.024 (ref 1.005–1.030)
pH: 6 (ref 5.0–8.0)

## 2021-03-23 LAB — COMPREHENSIVE METABOLIC PANEL
ALT: 30 U/L (ref 0–44)
AST: 27 U/L (ref 15–41)
Albumin: 4.5 g/dL (ref 3.5–5.0)
Alkaline Phosphatase: 49 U/L (ref 38–126)
Anion gap: 9 (ref 5–15)
BUN: 17 mg/dL (ref 8–23)
CO2: 28 mmol/L (ref 22–32)
Calcium: 10 mg/dL (ref 8.9–10.3)
Chloride: 99 mmol/L (ref 98–111)
Creatinine, Ser: 1.07 mg/dL (ref 0.61–1.24)
GFR, Estimated: >60 mL/min (ref >60)
Glucose, Bld: 96 mg/dL (ref 70–99)
Potassium: 3.6 mmol/L (ref 3.5–5.1)
Sodium: 136 mmol/L (ref 135–145)
Total Bilirubin: 0.9 mg/dL (ref 0.3–1.2)
Total Protein: 7.5 g/dL (ref 6.5–8.1)

## 2021-03-23 LAB — CBC
HCT: 41.5 % (ref 39.0–52.0)
Hemoglobin: 14.1 g/dL (ref 13.0–17.0)
MCH: 30.9 pg (ref 26.0–34.0)
MCHC: 34 g/dL (ref 30.0–36.0)
MCV: 91 fL (ref 80.0–100.0)
Platelets: 317 10*3/uL (ref 150–400)
RBC: 4.56 MIL/uL (ref 4.22–5.81)
RDW: 13.7 % (ref 11.5–15.5)
WBC: 11.1 10*3/uL — ABNORMAL HIGH (ref 4.0–10.5)
nRBC: 0 % (ref 0.0–0.2)

## 2021-03-23 LAB — LIPASE, BLOOD: Lipase: 29 U/L (ref 11–51)

## 2021-03-23 MED ORDER — SODIUM CHLORIDE 0.9 % IV BOLUS
1000.0000 mL | Freq: Once | INTRAVENOUS | Status: AC
  Administered 2021-03-23: 13:00:00 1000 mL via INTRAVENOUS

## 2021-03-23 MED ORDER — DICYCLOMINE HCL 20 MG PO TABS: 20.0000 mg | ORAL_TABLET | Freq: Two times a day (BID) | ORAL | 0 refills | Status: AC

## 2021-03-23 MED ORDER — VALACYCLOVIR HCL 1 G PO TABS: 1000.0000 mg | ORAL_TABLET | Freq: Three times a day (TID) | ORAL | 0 refills | Status: DC

## 2021-03-23 MED ORDER — ONDANSETRON HCL 4 MG/2ML IJ SOLN
4.0000 mg | Freq: Once | INTRAMUSCULAR | Status: AC
  Administered 2021-03-23: 13:00:00 4 mg via INTRAVENOUS
  Filled 2021-03-23: qty 2

## 2021-03-23 MED ORDER — IOHEXOL 300 MG/ML  SOLN
100.0000 mL | Freq: Once | INTRAMUSCULAR | Status: AC | PRN
  Administered 2021-03-23: 13:00:00 100 mL via INTRAVENOUS

## 2021-03-23 MED ORDER — MORPHINE SULFATE (PF) 2 MG/ML IV SOLN
2.0000 mg | Freq: Once | INTRAVENOUS | Status: AC
  Administered 2021-03-23: 13:00:00 2 mg via INTRAVENOUS
  Filled 2021-03-23: qty 1

## 2021-03-23 NOTE — ED Notes (Signed)
RN provided AVS using Teachback Method. Patient verbalizes understanding of Discharge Instructions. Opportunity for Questioning and Answers were provided by RN. Patient Discharged from ED ambulatory to Home via Self.  

## 2021-03-23 NOTE — ED Provider Notes (Signed)
[facility] EMERGENCY DEPT Provider Note   CSN: [id number] Arrival date & time: 03/23/21  1106     History Chief Complaint  Patient presents with   Abdominal Pain    Gabriel Aguilar is a 70 y.o. male.   Abdominal Pain Associated symptoms: chills, constipation and nausea   Associated symptoms: no chest pain, no cough, no dysuria, no fever, no hematuria, no shortness of breath and no vomiting    Patient with history of PAF, GERD, CAD, hypertension, hyperlipidemia, status post nephrectomy presents due to abdominal pain and constipation.  Patient has been having epigastric and left-sided abdominal pain for the last week.  The pain is constant, feels like cramping.  Associated with nausea but no vomiting or diarrhea.  He is feeling constipated and bloated, has had a small bowel movement earlier today.  Has tried over-the-counter laxatives with minimal improvement.  Is also taking Motrin for the pain which has slightly improved the pain.  Pain is about a 4 out of 10.  No prior abdominal surgeries other than nephrectomy, last colonoscopy was recent with nonmalignant colon polyps.  Patient does not have a history of colon cancer. Denies any chest pain, blood in the stool, back pain, shortness of breath.  Patient also has a rash to the left side of his chest/upperabdomen that started 2 days ago.  It is painful, not pruritic.  Has not had shingles vaccination.  Past Medical History:  Diagnosis Date   Arthritis    Coronary artery disease    Erectile dysfunction    GERD (gastroesophageal reflux disease)    Hiatal hernia    History of pericarditis    HTN (hypertension)    Hyperlipidemia LDL goal <70    Hypogonadism male    has seen  Dr. Orland Mustard   Palpitation    a. 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   Antiplatelet or antithrombotic long-term use 09/16/2018   Coronary artery disease involving native coronary artery of native heart without angina pectoris 09/16/2018   Dyslipidemia 09/16/2018   Essential hypertension 09/16/2018   Educated about COVID-19 virus infection 09/16/2018   Ankle edema, bilateral 07/17/2018   Status post coronary artery stent placement    NSTEMI (non-ST elevated myocardial infarction) ([place])    Elevated troponin 05/28/2018   Prostatitis 07/05/2015   BPH (benign prostatic hyperplasia) 07/01/2015   Dysuria 07/01/2015   PAF (paroxysmal atrial fibrillation) ([facility])    Acute pericarditis    Second degree AV block    Chest pain 05/11/2015   GERD (gastroesophageal reflux disease) 05/11/2015   Persistent lymphocytosis 05/11/2015   Single kidney 05/11/2015   Hyperlipidemia 05/11/2015   Other and unspecified hyperlipidemia 06/02/2013   OSA (obstructive sleep apnea) 03/05/2013    Past Surgical History:  Procedure Laterality Date   BACK SURGERY  1994   L4-5   CARDIAC CATHETERIZATION     COLONOSCOPY  07-09-12   per Dr. 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    [place]   disc at L4-L5   VASECTOMY         Family History  Problem Relation Age of Onset    Hyperlipidemia Father 73       KIDNEY FAILURE   Heart disease Father    Hypertension Father    Kidney disease Father    Diabetes Father    Heart disease Mother 32   Other Sister        LIVER FAILURE   CVA Brother    Other Sister        AT BIRTH   Heart attack Sister    CVA Brother    Other Brother        SEVERAL CABG   Colon cancer Neg Hx     Social History   Tobacco Use   Smoking status: Former    Packs/day: 1.50    Types: Cigarettes    Start date: 04/10/1964    Quit date: 03/06/1983    Years since quitting: 38.0   Smokeless tobacco: Never  Vaping Use   Vaping Use: Never used  Substance Use Topics   Alcohol use: Yes    Alcohol/week: 1.0 standard drink    Types: 1 Glasses of wine per week    Comment: rare   Drug use: No    Home Medications Prior to Admission medications   Medication Sig Start Date End Date Taking? Authorizing Provider  amLODipine (NORVASC) 10 MG tablet TAKE ONE TABLET BY MOUTH ONCE DAILY 12/03/20  Yes [name], MD  Ascorbic Acid (VITAMIN C) 1000 MG tablet Take 1,000 mg by mouth daily.   Yes [provider]  atorvastatin (LIPITOR) 80 MG tablet TAKE ONE TABLET BY MOUTH EVERY EVENING AT 6pm.  Patient needs a appointment for future refills. 03/07/21   [name], MD  b complex vitamins capsule Take 1 capsule by mouth daily.   Yes [provider]  dicyclomine (BENTYL) 20 MG tablet Take 1 tablet (20 mg total) by mouth 2 (two) times daily. 03/23/21  Yes Sherrill Raring, PA-C  niacin 500 MG tablet Take 500 mg by mouth at bedtime.   Yes [provider]  omeprazole (PRILOSEC) 40 MG capsule TAKE ONE CAPSULE BY MOUTH ONCE DAILY Patient taking differently: as needed. 10/29/20  Yes [name], MD  valACYclovir (VALTREX) 1000 MG tablet Take 1 tablet (1,000 mg total) by mouth 3 (three) times daily. 03/23/21  Yes Sherrill Raring, PA-C  amoxicillin-clavulanate (AUGMENTIN) 875-125 MG tablet Take 1 tablet by mouth 2 (two) times  daily. Patient not taking: Reported on 03/23/2021 02/22/21   Lucretia Kern, DO  benzonatate (TESSALON PERLES) 100 MG capsule 1-2 capsules up to twice daily as needed for cough Patient not taking: Reported on 03/23/2021 02/22/21   Lucretia Kern, DO  clopidogrel (PLAVIX) 75 MG tablet TAKE ONE TABLET BY MOUTH EVERY MORNING WITH BREAKFAST 12/03/20   [name], MD  fenofibrate 54 MG tablet TAKE ONE TABLET BY MOUTH ONCE DAILY 07/29/20   [name], MD  hydrochlorothiazide (HYDRODIURIL) 25 MG tablet Take 1 tablet (25 mg total) by mouth  daily. 02/22/21   [name], MD  metoprolol tartrate (LOPRESSOR) 25 MG tablet Take 0.5 tablets (12.5 mg total) by mouth every 8 (eight) hours as needed ([place]). 02/22/21 05/23/21  [name], MD  nitroGLYCERIN (NITROSTAT) 0.4 MG SL tablet Place 1 tablet (0.4 mg total) under the tongue every 5 (five) minutes as needed for chest pain. 08/14/19   [name], MD  XARELTO 20 MG TABS tablet TAKE ONE TABLET BY MOUTH ONCE DAILY WITH SUPPER 02/03/20   [name], MD    Allergies    Patient has no known allergies.  Review of Systems   Review of Systems  Constitutional:  Positive for chills. Negative for fever.  HENT:  Negative for congestion.   Respiratory:  Negative for cough and shortness of breath.   Cardiovascular:  Negative for chest pain and leg swelling.  Gastrointestinal:  Positive for abdominal distention, abdominal pain, constipation and nausea. Negative for blood in stool and vomiting.  Genitourinary:  Negative for dysuria and hematuria.  Musculoskeletal:  Negative for back pain.  Skin:  Positive for rash.  Neurological:  Negative for dizziness and syncope.   Physical Exam Updated Vital Signs BP 134/69 (BP Location: Right Arm)    Pulse 65    Temp 97.8 F (36.6 C) (Temporal)    Resp 16    Ht 5\' 8"  (1.727 m)    Wt 88.6 kg    SpO2 97%    BMI 29.70 kg/m   Physical Exam Vitals and nursing note reviewed. Exam conducted  with a chaperone present.  Constitutional:      Appearance: Normal appearance.  HENT:     Head: Normocephalic and atraumatic.  Eyes:     General: No scleral icterus.       Right eye: No discharge.        Left eye: No discharge.     Extraocular Movements: Extraocular movements intact.     Pupils: Pupils are equal, round, and reactive to light.  Cardiovascular:     Rate and Rhythm: Normal rate and regular rhythm.     Pulses: Normal pulses.     Heart sounds: Normal heart sounds. No murmur heard.   No friction rub. No gallop.     Comments: Radial pulse 2+ bilat Pulmonary:     Effort: Pulmonary effort is normal. No respiratory distress.     Breath sounds: Normal breath sounds.  Abdominal:     General: Abdomen is flat. Bowel sounds are normal. There is no distension.     Palpations: Abdomen is soft.     Tenderness: There is abdominal tenderness in the epigastric area, left upper quadrant and left lower quadrant. There is no guarding.  Skin:    General: Skin is warm and dry.     Coloration: Skin is not jaundiced.     Findings: Rash present.     Comments: Zoster rash to left flank  Neurological:     Mental Status: He is alert. Mental status is at baseline.     Coordination: Coordination normal.    ED Results / Procedures / Treatments   Labs (all labs ordered are listed, but only abnormal results are displayed) Labs Reviewed  CBC - Abnormal; Notable for the following components:      Result Value   WBC 11.1 (*)    All other components within normal limits  URINALYSIS, ROUTINE W REFLEX MICROSCOPIC - Abnormal; Notable for the following components:   Hgb urine dipstick TRACE (*)  Protein, ur TRACE (*)    All other components within normal limits  LIPASE, BLOOD  COMPREHENSIVE METABOLIC PANEL    EKG None  Radiology CT Abdomen Pelvis W Contrast  Result Date: 03/23/2021 CLINICAL DATA:  Left lower quadrant pain. EXAM: CT ABDOMEN AND PELVIS WITH CONTRAST TECHNIQUE:  Multidetector CT imaging of the abdomen and pelvis was performed using the standard protocol following bolus administration of intravenous contrast. CONTRAST:  172mL OMNIPAQUE IOHEXOL 300 MG/ML  SOLN COMPARISON:  None. FINDINGS: Lower Chest: No acute findings. Hepatobiliary: No hepatic masses identified. Moderate diffuse hepatic steatosis is seen. Gallbladder is unremarkable. No evidence of biliary ductal dilatation. Pancreas:  No mass or inflammatory changes. Spleen: Within normal limits in size and appearance. Adrenals/Urinary Tract: Prior left nephrectomy. Normal appearance of the right kidney. No masses identified. No evidence of ureteral calculi or hydronephrosis. Stomach/Bowel: No evidence of obstruction, inflammatory process or abnormal fluid collections. Vascular/Lymphatic: No pathologically enlarged lymph nodes. No acute vascular findings. Aortic atherosclerotic calcification noted. Reproductive: Mildly enlarged prostate gland, mass effect on bladder base. Other:  None. Musculoskeletal:  No suspicious bone lesions identified. IMPRESSION: No acute findings within the abdomen or pelvis. Moderate hepatic steatosis. Mildly enlarged prostate. Prior left nephrectomy. Aortic Atherosclerosis (ICD10-I70.0). Electronically Signed   By: Marlaine Hind M.D.   On: 03/23/2021 13:45    Procedures Procedures   Medications Ordered in ED Medications  sodium chloride 0.9 % bolus 1,000 mL (0 mLs Intravenous Stopped 03/23/21 1537)  ondansetron (ZOFRAN) injection 4 mg (4 mg Intravenous Given 03/23/21 1320)  morphine 2 MG/ML injection 2 mg (2 mg Intravenous Given 03/23/21 1320)  iohexol (OMNIPAQUE) 300 MG/ML solution 100 mL (100 mLs Intravenous Contrast Given 03/23/21 1329)    ED Course  I have reviewed the triage vital signs and the nursing notes.  Pertinent labs & imaging results that were available during my care of the patient were reviewed by me and considered in my medical decision making (see chart for  details).    MDM Rules/Calculators/A&P                           Stable vitals, nontoxic-appearing.  No fever, abdomen is soft with some epigastric tenderness but no guarding.  SBO is a consideration given the constipation but he still passing gas making this somewhat less likely.  Rash is consistent with herpes zoster, will give Valtrex.  Mild leukocytosis, no gross electrolyte derangement.  Patient is not anemic.  Lipase is negative, pancreatitis unlikely.  UA says some slight hemoglobin but no consistent findings with a UTI.  Additionally, no flank pain which would be concerning for nephrolithiasis.  CT ordered and negative for any acute abdominal pathology.  No bowel obstruction.  Considered AAA given cardiac history but is not having chest pain or shortness of breath.  Patient pain improved, do not think he needs additional work-up at this time.  Will discharge with Valtrex and Bentyl, advised to follow-up with PCP or to return if pain worsens significantly.  Also advised to start MiraLAX daily until bowel function has returned to baseline.  Final Clinical Impression(s) / ED Diagnoses Final diagnoses:  Herpes zoster without complication  Constipation, unspecified constipation type  Abdominal pain, unspecified abdominal location    Rx / DC Orders ED Discharge Orders          Ordered    valACYclovir (VALTREX) 1000 MG tablet  3 times daily        03/23/21 1404  dicyclomine (BENTYL) 20 MG tablet  2 times daily        03/23/21 1436             Sherrill Raring, [place] 03/23/21 1726    Jeanell Sparrow, DO 03/25/21 1642

## 2021-03-23 NOTE — ED Notes (Signed)
Patient returned from CT

## 2021-03-23 NOTE — Discharge Instructions (Addendum)
Take Bentyl twice daily for the next 7 days. Take MiraLAX 1 spoonful daily until you are regular. Take Valtrex 3 times daily for the next 7 days. Follow-up with your primary care doctor early next week for recheck.  Return to the ED if things change or worsen significantly.

## 2021-03-23 NOTE — ED Triage Notes (Signed)
Patient reports abdominal pain, constipation and bloating progressively worse. Reporting some nausea on 12/3 while at a restaurant and had to strain a lot, symptoms has been progressively worse since then. States small bowel movement this morning. States cramping, soreness to abd. Used an OTC suppository for constipation without relief.

## 2021-03-23 NOTE — ED Triage Notes (Signed)
Also describes shingles type rash to left side since 12/12. Recent hotel stay, concerned about cleanliness.

## 2021-04-07 DIAGNOSIS — B0229 Other postherpetic nervous system involvement: Secondary | ICD-10-CM | POA: Diagnosis not present

## 2021-04-07 DIAGNOSIS — B029 Zoster without complications: Secondary | ICD-10-CM | POA: Diagnosis not present

## 2021-04-19 ENCOUNTER — Telehealth (INDEPENDENT_AMBULATORY_CARE_PROVIDER_SITE_OTHER): Payer: PPO | Admitting: Family Medicine

## 2021-04-19 ENCOUNTER — Encounter: Payer: Self-pay | Admitting: Family Medicine

## 2021-04-19 VITALS — BP 128/63 | HR 86 | Temp 99.4°F

## 2021-04-19 DIAGNOSIS — U071 COVID-19: Secondary | ICD-10-CM

## 2021-04-19 MED ORDER — NIRMATRELVIR/RITONAVIR (PAXLOVID)TABLET: 3.0000 | ORAL_TABLET | Freq: Two times a day (BID) | ORAL | 0 refills | Status: AC

## 2021-04-19 NOTE — Progress Notes (Signed)
Subjective:    Patient ID: Gabriel Aguilar, male    DOB: 06-22-1950, 71 y.o.   MRN: [id number]  HPI Virtual Visit via Video Note  I connected with the patient on 04/19/21 at  3:45 PM EST by a video enabled telemedicine application and verified that I am speaking with the correct person using two identifiers.  Location patient: home Location provider:work or home office Persons participating in the virtual visit: patient, provider  I discussed the limitations of evaluation and management by telemedicine and the availability of in person appointments. The patient expressed understanding and agreed to proceed.   HPI: Here for another Covid-19 infection. About 3 days ago he developed sinus congestion, chills, headache, and a dry cough. No SOB. He tested positive for the Covid virus this afternoon. He is drinking fluids and taking Tylenol.   ROS: See pertinent positives and negatives per HPI.  Past Medical History:  Diagnosis Date   Arthritis    Coronary artery disease    Erectile dysfunction    GERD (gastroesophageal reflux disease)    Hiatal hernia    History of pericarditis    HTN (hypertension)    Hyperlipidemia LDL goal <70    Hypogonadism male    has seen  Dr. Orland Mustard   Palpitation    a. montior 2 weeks 2014--> no arrhythmia   Paroxysmal atrial fibrillation ([place])    Sleep apnea, central    wears a CPAP at night     Past Surgical History:  Procedure Laterality Date   BACK SURGERY  1994   L4-5   CARDIAC CATHETERIZATION     COLONOSCOPY  07-09-12   per Dr. 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    [place]   disc at L4-L5   VASECTOMY      Family History  Problem Relation Age of Onset   Hyperlipidemia Father 21       KIDNEY FAILURE   Heart disease Father    Hypertension Father    Kidney disease Father    Diabetes Father    Heart disease Mother 23   Other Sister        LIVER FAILURE   CVA Brother    Other Sister        AT BIRTH   Heart attack Sister    CVA Brother    Other Brother        SEVERAL CABG   Colon cancer Neg Hx      Current Outpatient Medications:    amLODipine (NORVASC) 10 MG tablet, TAKE ONE TABLET BY MOUTH ONCE DAILY, Disp: 90 tablet, Rfl: 1   Ascorbic Acid (VITAMIN C) 1000 MG tablet, Take 1,000 mg by mouth daily., Disp: , Rfl:    atorvastatin (LIPITOR) 80 MG tablet, TAKE ONE TABLET BY MOUTH EVERY EVENING AT 6pm.  Patient needs a appointment for future refills., Disp: 90 tablet, Rfl: 0   b complex vitamins capsule,  Take 1 capsule by mouth daily., Disp: , Rfl:    benzonatate (TESSALON PERLES) 100 MG capsule, 1-2 capsules up to twice daily as needed for cough, Disp: 30 capsule, Rfl: 0   clopidogrel (PLAVIX) 75 MG tablet, TAKE ONE TABLET BY MOUTH EVERY MORNING WITH BREAKFAST, Disp: 90 tablet, Rfl: 1   dicyclomine (BENTYL) 20 MG tablet, Take 1 tablet (20 mg total) by mouth 2 (two) times daily., Disp: 20 tablet, Rfl: 0   fenofibrate 54 MG tablet, TAKE ONE TABLET BY MOUTH ONCE DAILY, Disp: 90 tablet, Rfl: 3   hydrochlorothiazide (HYDRODIURIL) 25 MG tablet, Take 1 tablet (25 mg total) by mouth daily., Disp: 90 tablet, Rfl: 1   metoprolol tartrate (LOPRESSOR) 25 MG tablet, Take 0.5 tablets (12.5 mg total) by mouth every 8 (eight) hours as needed (ELEVATED HEART RATE)., Disp: 45 tablet, Rfl: 3   niacin 500 MG tablet, Take 500 mg by mouth at bedtime., Disp: , Rfl:    nirmatrelvir/ritonavir EUA (PAXLOVID) 20 x  150 MG & 10 x 100MG  TABS, Take 3 tablets by mouth 2 (two) times daily for 5 days. (Take nirmatrelvir 150 mg two tablets twice daily for 5 days and ritonavir 100 mg one tablet twice daily for 5 days) Patient GFR is 65, Disp: 30 tablet, Rfl: 0   nitroGLYCERIN (NITROSTAT) 0.4 MG SL tablet, Place 1 tablet (0.4 mg total) under the tongue every 5 (five) minutes as needed for chest pain., Disp: 25 tablet, Rfl: 1   omeprazole (PRILOSEC) 40 MG capsule, TAKE ONE CAPSULE BY MOUTH ONCE DAILY (Patient taking differently: as needed.), Disp: 90 capsule, Rfl: 1   valACYclovir (VALTREX) 1000 MG tablet, Take 1 tablet (1,000 mg total) by mouth 3 (three) times daily., Disp: 21 tablet, Rfl: 0   XARELTO 20 MG TABS tablet, TAKE ONE TABLET BY MOUTH ONCE DAILY WITH SUPPER, Disp: 90 tablet, Rfl: 1  EXAM:  VITALS per patient if applicable:  GENERAL: alert, oriented, appears well and in no acute distress  HEENT: atraumatic, conjunttiva clear, no obvious abnormalities on inspection of external nose and ears  NECK: normal movements of the head and neck  LUNGS: on inspection no signs of respiratory distress, breathing rate appears normal, no obvious gross SOB, gasping or wheezing  CV: no obvious cyanosis  MS: moves all visible extremities without noticeable abnormality  PSYCH/NEURO: pleasant and cooperative, no obvious depression or anxiety, speech and thought processing grossly intact  ASSESSMENT AND PLAN: Covid infection, treat with 5 days of Paxlovid. Recheck as needed.  Alysia Penna, MD  Discussed the following assessment and plan:  No diagnosis found.     I discussed the assessment and treatment plan with the patient. The patient was provided an opportunity to ask questions and all were answered. The patient agreed with the plan and demonstrated an understanding of the instructions.   The patient was advised to call back or seek an in-person evaluation if the symptoms worsen or if the condition fails to  improve as anticipated.      Review of Systems     Objective:   Physical Exam        Assessment & Plan:

## 2021-05-04 ENCOUNTER — Ambulatory Visit (INDEPENDENT_AMBULATORY_CARE_PROVIDER_SITE_OTHER): Payer: PPO

## 2021-05-04 VITALS — Ht 68.0 in | Wt 202.0 lb

## 2021-05-04 DIAGNOSIS — Z0000 Encounter for general adult medical examination without abnormal findings: Secondary | ICD-10-CM

## 2021-05-04 NOTE — Patient Instructions (Addendum)
[name] , Thank you for taking time to come for your Medicare Wellness Visit. I appreciate your ongoing commitment to your health goals. Please review the following plan we discussed and let me know if I can assist you in the future.   These are the goals we discussed:  Goals      Patient Stated     Continue to lose weight.      Patient Stated     I will continue to exercise daily 45 minutes-60 minutes         This is a list of the screening recommended for you and due dates:  Health Maintenance  Topic Date Due   COVID-19 Vaccine (4 - Booster for Pfizer series) 09/25/2020   Flu Shot  07/08/2021*   Zoster (Shingles) Vaccine (1 of 2) 08/02/2021*   Colon Cancer Screening  08/27/2023   Tetanus Vaccine  08/05/2030   Pneumonia Vaccine  Completed   Hepatitis C Screening: USPSTF Recommendation to screen - Ages 18-79 yo.  Completed   HPV Vaccine  Aged Out  *Topic was postponed. The date shown is not the original due date.   Advanced directives: No  Conditions/risks identified: None  Next appointment: Follow up in one year for your annual wellness visit.  Preventive Care 73 Years and Older, Male Preventive care refers to lifestyle choices and visits with your health care provider that can promote health and wellness. What does preventive care include? A yearly physical exam. This is also called an annual well check. Dental exams once or twice a year. Routine eye exams. Ask your health care provider how often you should have your eyes checked. Personal lifestyle choices, including: Daily care of your teeth and gums. Regular physical activity. Eating a healthy diet. Avoiding tobacco and drug use. Limiting alcohol use. Practicing safe sex. Taking low doses of aspirin every day. Taking vitamin and mineral supplements as recommended by your health care provider. What happens during an annual well check? The services and screenings done by your health care provider during your  annual well check will depend on your age, overall health, lifestyle risk factors, and family history of disease. Counseling  Your health care provider may ask you questions about your: Alcohol use. Tobacco use. Drug use. Emotional well-being. Home and relationship well-being. Sexual activity. Eating habits. History of falls. Memory and ability to understand (cognition). Work and work Statistician. Screening  You may have the following tests or measurements: Height, weight, and BMI. Blood pressure. Lipid and cholesterol levels. These may be checked every 5 years, or more frequently if you are over 22 years old. Skin check. Lung cancer screening. You may have this screening every year starting at age 47 if you have a 30-pack-year history of smoking and currently smoke or have quit within the past 15 years. Fecal occult blood test (FOBT) of the stool. You may have this test every year starting at age 65. Flexible sigmoidoscopy or colonoscopy. You may have a sigmoidoscopy every 5 years or a colonoscopy every 10 years starting at age 33. Prostate cancer screening. Recommendations will vary depending on your family history and other risks. Hepatitis C blood test. Hepatitis B blood test. Sexually transmitted disease (STD) testing. Diabetes screening. This is done by checking your blood sugar (glucose) after you have not eaten for a while (fasting). You may have this done every 1-3 years. Abdominal aortic aneurysm (AAA) screening. You may need this if you are a current or former smoker. Osteoporosis. You may  be screened starting at age 51 if you are at high risk. Talk with your health care provider about your test results, treatment options, and if necessary, the need for more tests. Vaccines  Your health care provider may recommend certain vaccines, such as: Influenza vaccine. This is recommended every year. Tetanus, diphtheria, and acellular pertussis (Tdap, Td) vaccine. You may need a Td  booster every 10 years. Zoster vaccine. You may need this after age 34. Pneumococcal 13-valent conjugate (PCV13) vaccine. One dose is recommended after age 30. Pneumococcal polysaccharide (PPSV23) vaccine. One dose is recommended after age 50. Talk to your health care provider about which screenings and vaccines you need and how often you need them. This information is not intended to replace advice given to you by your health care provider. Make sure you discuss any questions you have with your health care provider. Document Released: 04/23/2015 Document Revised: 12/15/2015 Document Reviewed: 01/26/2015 Elsevier Interactive Patient Education  2017 [employer] Prevention in the Home Falls can cause injuries. They can happen to people of all ages. There are many things you can do to make your home safe and to help prevent falls. What can I do on the outside of my home? Regularly fix the edges of walkways and driveways and fix any cracks. Remove anything that might make you trip as you walk through a door, such as a raised step or threshold. Trim any bushes or trees on the path to your home. Use bright outdoor lighting. Clear any walking paths of anything that might make someone trip, such as rocks or tools. Regularly check to see if handrails are loose or broken. Make sure that both sides of any steps have handrails. Any raised decks and porches should have guardrails on the edges. Have any leaves, snow, or ice cleared regularly. Use sand or salt on walking paths during winter. Clean up any spills in your garage right away. This includes oil or grease spills. What can I do in the bathroom? Use night lights. Install grab bars by the toilet and in the tub and shower. Do not use towel bars as grab bars. Use non-skid mats or decals in the tub or shower. If you need to sit down in the shower, use a plastic, non-slip stool. Keep the floor dry. Clean up any water that spills on the floor  as soon as it happens. Remove soap buildup in the tub or shower regularly. Attach bath mats securely with double-sided non-slip rug tape. Do not have throw rugs and other things on the floor that can make you trip. What can I do in the bedroom? Use night lights. Make sure that you have a light by your bed that is easy to reach. Do not use any sheets or blankets that are too big for your bed. They should not hang down onto the floor. Have a firm chair that has side arms. You can use this for support while you get dressed. Do not have throw rugs and other things on the floor that can make you trip. What can I do in the kitchen? Clean up any spills right away. Avoid walking on wet floors. Keep items that you use a lot in easy-to-reach places. If you need to reach something above you, use a strong step stool that has a grab bar. Keep electrical cords out of the way. Do not use floor polish or wax that makes floors slippery. If you must use wax, use non-skid floor wax. Do not  have throw rugs and other things on the floor that can make you trip. What can I do with my stairs? Do not leave any items on the stairs. Make sure that there are handrails on both sides of the stairs and use them. Fix handrails that are broken or loose. Make sure that handrails are as long as the stairways. Check any carpeting to make sure that it is firmly attached to the stairs. Fix any carpet that is loose or worn. Avoid having throw rugs at the top or bottom of the stairs. If you do have throw rugs, attach them to the floor with carpet tape. Make sure that you have a light switch at the top of the stairs and the bottom of the stairs. If you do not have them, ask someone to add them for you. What else can I do to help prevent falls? Wear shoes that: Do not have high heels. Have rubber bottoms. Are comfortable and fit you well. Are closed at the toe. Do not wear sandals. If you use a stepladder: Make sure that it is  fully opened. Do not climb a closed stepladder. Make sure that both sides of the stepladder are locked into place. Ask someone to hold it for you, if possible. Clearly mark and make sure that you can see: Any grab bars or handrails. First and last steps. Where the edge of each step is. Use tools that help you move around (mobility aids) if they are needed. These include: Canes. Walkers. Scooters. Crutches. Turn on the lights when you go into a dark area. Replace any light bulbs as soon as they burn out. Set up your furniture so you have a clear path. Avoid moving your furniture around. If any of your floors are uneven, fix them. If there are any pets around you, be aware of where they are. Review your medicines with your doctor. Some medicines can make you feel dizzy. This can increase your chance of falling. Ask your doctor what other things that you can do to help prevent falls. This information is not intended to replace advice given to you by your health care provider. Make sure you discuss any questions you have with your health care provider. Document Released: 01/21/2009 Document Revised: 09/02/2015 Document Reviewed: 05/01/2014 Elsevier Interactive Patient Education  2017 [employer].

## 2021-05-04 NOTE — Progress Notes (Signed)
Subjective:   Gabriel Aguilar is a 71 y.o. male who presents for Medicare Annual/Subsequent preventive examination.  Review of Systems    No ROS Cardiac Risk Factors include: advanced age (>54men, >36 women);hypertension;male gender    Objective:    Today's Vitals   05/04/21 1517  Weight: 202 lb (91.6 kg)  Height: 5\' 8"  (1.727 m)   Body mass index is 30.71 kg/m.  Advanced Directives 05/04/2021 03/23/2021 03/17/2020 05/27/2018 05/13/2018 05/11/2015  Does Patient Have a Medical Advance Directive? No No No No No No  Would patient like information on creating a medical advance directive? No - Patient declined Yes (ED - Information included in AVS) No - Patient declined No - Patient declined Yes (MAU/Ambulatory/Procedural Areas - Information given) No - patient declined information    Current Medications (verified) Outpatient Encounter Medications as of 05/04/2021  Medication Sig   amLODipine (NORVASC) 10 MG tablet TAKE ONE TABLET BY MOUTH ONCE DAILY   Ascorbic Acid (VITAMIN C) 1000 MG tablet Take 1,000 mg by mouth daily.   atorvastatin (LIPITOR) 80 MG tablet TAKE ONE TABLET BY MOUTH EVERY EVENING AT 6pm.  Patient needs a appointment for future refills.   b complex vitamins capsule Take 1 capsule by mouth daily.   benzonatate (TESSALON PERLES) 100 MG capsule 1-2 capsules up to twice daily as needed for cough   clopidogrel (PLAVIX) 75 MG tablet TAKE ONE TABLET BY MOUTH EVERY MORNING WITH BREAKFAST   dicyclomine (BENTYL) 20 MG tablet Take 1 tablet (20 mg total) by mouth 2 (two) times daily.   fenofibrate 54 MG tablet TAKE ONE TABLET BY MOUTH ONCE DAILY   hydrochlorothiazide (HYDRODIURIL) 25 MG tablet Take 1 tablet (25 mg total) by mouth daily.   metoprolol tartrate (LOPRESSOR) 25 MG tablet Take 0.5 tablets (12.5 mg total) by mouth every 8 (eight) hours as needed (ELEVATED HEART RATE).   niacin 500 MG tablet Take 500 mg by mouth at bedtime.   nitroGLYCERIN (NITROSTAT) 0.4 MG SL tablet Place  1 tablet (0.4 mg total) under the tongue every 5 (five) minutes as needed for chest pain.   omeprazole (PRILOSEC) 40 MG capsule TAKE ONE CAPSULE BY MOUTH ONCE DAILY (Patient taking differently: as needed.)   valACYclovir (VALTREX) 1000 MG tablet Take 1 tablet (1,000 mg total) by mouth 3 (three) times daily.   XARELTO 20 MG TABS tablet TAKE ONE TABLET BY MOUTH ONCE DAILY WITH SUPPER   No facility-administered encounter medications on file as of 05/04/2021.    Allergies (verified) Patient has no known allergies.   History: Past Medical History:  Diagnosis Date   Arthritis    Coronary artery disease    Erectile dysfunction    GERD (gastroesophageal reflux disease)    Hiatal hernia    History of pericarditis    HTN (hypertension)    Hyperlipidemia LDL goal <70    Hypogonadism male    has seen  Dr. Orland Mustard   Palpitation    a. montior 2 weeks 2014--> no arrhythmia   Paroxysmal atrial fibrillation ([place])    Sleep apnea, central    wears a CPAP at night    Past Surgical History:  Procedure Laterality Date   BACK SURGERY  1994   L4-5   CARDIAC CATHETERIZATION     COLONOSCOPY  07-09-12   per Dr. 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    [place]   disc at L4-L5   VASECTOMY     Family History  Problem Relation Age of Onset   Hyperlipidemia Father 38       KIDNEY FAILURE   Heart disease Father    Hypertension Father    Kidney disease Father    Diabetes  Father    Heart disease Mother 76   Other Sister        LIVER FAILURE   CVA Brother    Other Sister        AT BIRTH   Heart attack Sister    CVA Brother    Other Brother        SEVERAL CABG   Colon cancer Neg Hx    Social History   Socioeconomic History   Marital status: Married    Spouse name: Not on file   Number of children: 2   Years of education: Not on file   Highest education level: Not on file  Occupational History   Occupation: [employer]: works FT from home  Tobacco Use   Smoking status: Former    Packs/day: 1.50    Types: Cigarettes    Start date: 04/10/1964    Quit date: 03/06/1983    Years since quitting: 38.1   Smokeless tobacco: Never  Vaping Use   Vaping Use: Never used  Substance and Sexual Activity   Alcohol use: Yes    Alcohol/week: 1.0 standard drink    Types: 1 Glasses of wine per week    Comment: rare   Drug use: No   Sexual activity: Not on file  Other Topics Concern   Not on file  Social History Narrative   05/13/2018: Lives with wife in multi-level home, considering down-sizing in next few years, has two grown children.   Not quite ready to retire.   Loves to travel, planning [place] trip with wife in April   Goes to [place], uses elliptical and [employer]   Social Determinants of Radio broadcast assistant Strain: Low Risk    Difficulty of Paying Living Expenses: Not hard at all  Food Insecurity: No Food Insecurity   Worried About Charity fundraiser in the Last Year: Never true   Arboriculturist in the Last Year: Never true  Transportation Needs: No Transportation Needs   Lack of Transportation (Medical): No   Lack of Transportation (Non-Medical): No  Physical Activity: Sufficiently Active   Days of Exercise per Week: 5 days   Minutes of Exercise per Session: 30 min  Stress: No Stress Concern Present   Feeling of Stress : Not at all  Social Connections: Socially Integrated   Frequency of Communication with Friends  and Family: More than three times a week   Frequency of Social Gatherings with Friends and Family: More than three times a week   Attends Religious Services: More than 4 times per year   Active Member of [employer] or Organizations: Yes   Attends Archivist Meetings: More than 4 times  per year   Marital Status: Married    Clinical Intake:  Diabetic? No  Interpreter Needed?: NoActivities of Daily Living In your present state of health, do you have any difficulty performing the following activities: 05/04/2021  Hearing? N  Vision? N  Difficulty concentrating or making decisions? N  Walking or climbing stairs? N  Dressing or bathing? N  Doing errands, shopping? N  Using the Toilet? N  In the past six months, have you accidently leaked urine? N  Do you have problems with loss of bowel control? N  Managing your Medications? N  Managing your Finances? N  Some recent data might be hidden    Patient Care Team: Laurey Morale, MD as PCP - General (Family Medicine) [name], MD as PCP - Cardiology (Cardiology) Thompson Grayer, MD as PCP - Electrophysiology (Cardiology) Rigoberto Noel, MD as Consulting Physician (Pulmonary Disease) Magrinat, Virgie Dad, MD as Consulting Physician (Oncology) Viona Gilmore, [facility] as Pharmacist (Pharmacist)  Indicate any recent Medical Services you may have received from other than [facility] providers in the past year (date may be approximate).     Assessment:   This is a routine wellness examination for [place]. Virtual Visit via Telephone Note  I connected with  Gabriel Aguilar on 05/04/21 at  3:15 PM EST by telephone and verified that I am speaking with the correct person using two identifiers.  Location: Patient: Home Provider: Office Persons participating in the virtual visit: patient/Nurse Health Advisor   I discussed the limitations, risks, security and privacy concerns of performing an evaluation and management service by telephone and the  availability of in person appointments. The patient expressed understanding and agreed to proceed.  Interactive audio and video telecommunications were attempted between this nurse and patient, however failed, due to patient having technical difficulties OR patient did not have access to video capability.  We continued and completed visit with audio only.  Some vital signs may be absent or patient reported.   Criselda Peaches, LPN  Hearing/Vision screen Hearing Screening - Comments:: No hearing difficulty  Vision Screening - Comments:: Wears glasses. Followed by Dr Herbert Deaner  Dietary issues and exercise activities discussed: Current Exercise Habits: Home exercise routine, Type of exercise: walking, Time (Minutes): 30, Frequency (Times/Week): 5, Weekly Exercise (Minutes/Week): 150, Intensity: Moderate   Goals Addressed             This Visit's Progress    Patient Stated       Continue to lose weight.        Depression Screen PHQ 2/9 Scores 05/04/2021 08/04/2020 03/17/2020 07/24/2019 05/13/2018 05/13/2018 01/27/2015  PHQ - 2 Score 0 0 0 0 0 0 0  PHQ- 9 Score - 1 0 - 4 - -    Fall Risk Fall Risk  05/04/2021 08/04/2020 03/17/2020 05/13/2018 05/13/2018  Falls in the past year? 0 0 0 0 0  Number falls in past yr: 0 0 0 - -  Injury with Fall? 0 0 0 - -  Risk for fall due to : - No Fall Risks No Fall Risks - -  Follow up - - Falls evaluation completed;Falls prevention discussed - -    FALL RISK PREVENTION PERTAINING TO THE HOME:  Any stairs in or around the home? Yes  If so, are there any without handrails? No  Home free of loose throw rugs in walkways, pet beds, electrical cords, etc? Yes  Adequate lighting in your home to reduce risk of falls? Yes  ASSISTIVE DEVICES UTILIZED TO PREVENT FALLS:  Life alert? No  Use of a cane, walker or w/c? No  Grab bars in the bathroom? No  Shower chair or bench in shower? No  Elevated toilet seat or a handicapped toilet? No   TIMED UP AND GO:  Was  the test performed? No .   Cognitive Function:   6CIT Screen 05/04/2021  What Year? 0 points  What month? 0 points  What time? 0 points  Count back from 20 0 points  Months in reverse 0 points  Repeat phrase 0 points  Total Score 0    Immunizations Immunization History  Administered Date(s) Administered   Fluad Quad(high Dose 65+) 03/17/2020   Influenza Split 02/22/2011   PFIZER(Purple Top)SARS-COV-2 Vaccination 11/05/2019, 12/05/2019, 07/31/2020   Pneumococcal Conjugate-13 05/13/2018   Pneumococcal Polysaccharide-23 08/04/2020   Tdap 08/04/2020   Flu Vaccine status: Due, Education has been provided regarding the importance of this vaccine. Advised may receive this vaccine at local pharmacy or Health Dept. Aware to provide a copy of the vaccination record if obtained from local pharmacy or Health Dept. Verbalized acceptance and understanding.    Covid-19 vaccine status: Completed vaccines  Qualifies for Shingles Vaccine? Yes    Zostavax completed No   Shingrix Completed?: No.    Education has been provided regarding the importance of this vaccine. Patient has been advised to call insurance company to determine out of pocket expense if they have not yet received this vaccine. Advised may also receive vaccine at local pharmacy or Health Dept. Verbalized acceptance and understanding.  Screening Tests Health Maintenance  Topic Date Due   COVID-19 Vaccine (4 - Booster for Pfizer series) 09/25/2020   INFLUENZA VACCINE  07/08/2021 (Originally 11/08/2020)   Zoster Vaccines- Shingrix (1 of 2) 08/02/2021 (Originally 11/22/1969)   COLONOSCOPY (Pts 45-1yrs Insurance coverage will need to be confirmed)  08/27/2023   TETANUS/TDAP  08/05/2030   Pneumonia Vaccine 93+ Years old  Completed   Hepatitis C Screening  Completed   HPV VACCINES  Aged Out    Health Maintenance  Health Maintenance Due  Topic Date Due   COVID-19 Vaccine (4 - Booster for [place] series) 09/25/2020     Additional Screening:   Vision Screening: Recommended annual ophthalmology exams for early detection of glaucoma and other disorders of the eye. Is the patient up to date with their annual eye exam?  Yes  Who is the provider or what is the name of the office in which the patient attends annual eye exams? Followed by Dr Herbert Deaner  Dental Screening: Recommended annual dental exams for proper oral hygiene  Community Resource Referral / Chronic Care Management:  CRR required this visit?  No   CCM required this visit?  No      Plan:     I have personally reviewed and noted the following in the patients chart:   Medical and social history Use of alcohol, tobacco or illicit drugs  Current medications and supplements including opioid prescriptions. Patient is not currently taking opioid prescriptions. Functional ability and status Nutritional status Physical activity Advanced directives List of other physicians Hospitalizations, surgeries, and ER visits in previous 12 months Vitals Screenings to include cognitive, depression, and falls Referrals and appointments  In addition, I have reviewed and discussed with patient certain preventive protocols, quality metrics, and best practice recommendations. A written personalized care plan for preventive services as well as general preventive health recommendations were provided to patient.     Nino Parsley  Redmond Pulling, LPN   2/34/1443

## 2021-05-20 ENCOUNTER — Other Ambulatory Visit: Payer: Self-pay | Admitting: Cardiology

## 2021-05-20 ENCOUNTER — Telehealth: Payer: Self-pay | Admitting: Pharmacist

## 2021-05-20 NOTE — Chronic Care Management (AMB) (Signed)
Chronic Care Management Pharmacy Assistant   Name: Gabriel Aguilar  MRN: [id number] DOB: 10/12/1950  Reason for Encounter: Medication Review Medication Coordination   Conditions to be addressed/monitored: HTN  Recent office visits:  05/04/21 Criselda Peaches, LPN - Patient presented for [facility] Annual Wellness Exam. No medication changes.  04/19/21 Laurey Morale, MD - Patient presented for COVID infection. Prescribed Nirmatrelvir - Ritonavir. Stopped Amoxicillin.  Recent consult visits:  02/22/21 Lucretia Kern, DO - Patient presented for Nasal congestion and other concerns. Prescribed Amoxicillin and Benzonatate. Stopped Hydrocortisone.  Hospital visits:  Medication Reconciliation was completed by comparing discharge summary, patients EMR and Pharmacy list, and upon discussion with patient.  Patient presented to [facility] ED on 03/23/21 due to Herpes zoster without complication and other concerns. Patient was present for 3 hours.  New?Medications Started at [facility] Discharge:?? -started dicyclomine 20 MG tablet valACYclovir 1000 MG tablet  Medication Changes at Hospital Discharge: -Changed  none  Medications Discontinued at Hospital Discharge: -Stopped  none  Medications that remain the same after Hospital Discharge:??  -All other medications will remain the same.    Medications: Outpatient Encounter Medications as of 05/20/2021  Medication Sig Note   amLODipine (NORVASC) 10 MG tablet TAKE ONE TABLET BY MOUTH ONCE DAILY    Ascorbic Acid (VITAMIN C) 1000 MG tablet Take 1,000 mg by mouth daily.    atorvastatin (LIPITOR) 80 MG tablet TAKE ONE TABLET BY MOUTH EVERY EVENING AT 6pm.  Patient needs a appointment for future refills.    b complex vitamins capsule Take 1 capsule by mouth daily.    benzonatate (TESSALON PERLES) 100 MG capsule 1-2 capsules up to twice daily as needed for cough    clopidogrel (PLAVIX) 75 MG tablet TAKE ONE TABLET BY MOUTH EVERY MORNING  WITH BREAKFAST    dicyclomine (BENTYL) 20 MG tablet Take 1 tablet (20 mg total) by mouth 2 (two) times daily.    fenofibrate 54 MG tablet TAKE ONE TABLET BY MOUTH ONCE DAILY    hydrochlorothiazide (HYDRODIURIL) 25 MG tablet Take 1 tablet (25 mg total) by mouth daily.    metoprolol tartrate (LOPRESSOR) 25 MG tablet Take 0.5 tablets (12.5 mg total) by mouth every 8 (eight) hours as needed (ELEVATED HEART RATE). 03/23/2021: prn   niacin 500 MG tablet Take 500 mg by mouth at bedtime.    nitroGLYCERIN (NITROSTAT) 0.4 MG SL tablet Place 1 tablet (0.4 mg total) under the tongue every 5 (five) minutes as needed for chest pain. 03/23/2021: Has on hand   omeprazole (PRILOSEC) 40 MG capsule TAKE ONE CAPSULE BY MOUTH ONCE DAILY (Patient taking differently: as needed.)    valACYclovir (VALTREX) 1000 MG tablet Take 1 tablet (1,000 mg total) by mouth 3 (three) times daily.    XARELTO 20 MG TABS tablet TAKE ONE TABLET BY MOUTH ONCE DAILY WITH SUPPER 08/26/2020: Last dose 08/23/20 for colonoscopy   No facility-administered encounter medications on file as of 05/20/2021.   Reviewed chart prior to disease state call. Spoke with patient regarding BP  Recent Office Vitals: BP Readings from Last 3 Encounters:  04/19/21 128/63  03/23/21 134/69  10/25/20 (!) 139/57   Pulse Readings from Last 3 Encounters:  04/19/21 86  03/23/21 65  10/25/20 77    Wt Readings from Last 3 Encounters:  05/04/21 202 lb (91.6 kg)  03/23/21 195 lb 5.2 oz (88.6 kg)  10/25/20 208 lb (94.3 kg)     Kidney Function Lab Results  Component Value Date/Time  CREATININE 1.07 03/23/2021 11:46 AM   CREATININE 1.13 08/04/2020 09:14 AM   CREATININE 1.18 02/10/2016 01:13 PM   GFR 65.30 08/04/2020 09:14 AM   GFRNONAA >60 03/23/2021 11:46 AM   GFRAA >60 05/30/2018 01:47 AM    BMP Latest Ref Rng & Units 03/23/2021 08/04/2020 06/06/2019  Glucose 70 - 99 mg/dL 96 96 95  BUN 8 - 23 mg/dL 17 25(H) 18  Creatinine 0.61 - 1.24 mg/dL 1.07 1.13  1.10  Sodium 135 - 145 mmol/L 136 136 137  Potassium 3.5 - 5.1 mmol/L 3.6 3.5 3.5  Chloride 98 - 111 mmol/L 99 96 98  CO2 22 - 32 mmol/L 28 30 33(H)  Calcium 8.9 - 10.3 mg/dL 10.0 9.9 10.0    Current antihypertensive regimen:  Hydrochlorothiazide 25mg , 1 tablet once daily  Amlodipine 10mg , 1 tablet once daily How often are you checking your Blood Pressure? weekly Current home BP readings: Patient reports 135/76 he reports they remain under 140 denies any hyper/hypotensive symptoms What recent interventions/DTPs have been made by any provider to improve Blood Pressure control since last CPP Visit: Patient reports no changes Any recent hospitalizations or ED visits since last visit with CPP? No What exercise is being done to improve your Blood Pressure Control?  Patient reports exercising dally about 40-60 min  Adherence Review: Is the patient currently on ACE/ARB medication? No Does the patient have >5 day gap between last estimated fill dates? No  Reviewed chart for medication changes ahead of medication coordination call.  No OVs, Consults, or hospital visits since last care coordination call/Pharmacist visit.   No medication changes indicated  BP Readings from Last 3 Encounters:  04/19/21 128/63  03/23/21 134/69  10/25/20 (!) 139/57    Lab Results  Component Value Date   HGBA1C 5.9 08/04/2020     Patient obtains medications through Vials  90 Days   Last adherence delivery included:  HCTZ 25 mg; one tab once a day (Call to Dr [name] and requested script for [facility] today also) Fenofibrate 54 mg; one tab once a day Clopidogrel 75 mg; one tab once a day Amlodipine 10 mg; one tab once daily Atorvastatin 80 mg; one tab every evening Omeprazole 40 mg: one capsule daily - filled last month     Added refills for Metoprolol 25 mg.(Call to Dr [name] and requested script for [facility] today also)   Patient is due for next adherence delivery on: 06/02/21. Called patient  and reviewed medications and coordinated delivery. Confirmed Vials for 90 DS  This delivery to include: Fenofibrate 54 mg: one tab once a day Omeprazole 40 mg: one capsule daily  Clopidogrel 75 mg: one tab once a day Amlodipine 10 mg: one tab once daily HCTZ 25 mg : one tab once a day Metoprolol Tar 25 mg: 12.5 mg every 8 hours as needed   Patient Declined due to on hand supply Omeprazole 40 mg: one capsule daily  Metoprolol Tar 25 mg: 12.5 mg every 8 hours as needed   Patient needs refills for: Atorvastatin 80 mg ; Requested prescription be sent to [facility] Fluorouracil Ointment : Patient to request will be seeing provider on today  Confirmed delivery date of 06/02/21, advised patient that pharmacy will contact them the morning of delivery.    Care Gaps: COVID Booster - Overdue CCM- 4/23 AWV- 1/24 BP- 134/69 (03/23/21)  Star Rating Drugs: Atorvastatin 80 mg - Requested on 05/24/21   [place] Clinical Pharmacist Assistant [phone]

## 2021-05-24 ENCOUNTER — Telehealth: Payer: Self-pay | Admitting: Cardiology

## 2021-05-24 DIAGNOSIS — D485 Neoplasm of uncertain behavior of skin: Secondary | ICD-10-CM | POA: Diagnosis not present

## 2021-05-24 DIAGNOSIS — D225 Melanocytic nevi of trunk: Secondary | ICD-10-CM | POA: Diagnosis not present

## 2021-05-24 DIAGNOSIS — Z85828 Personal history of other malignant neoplasm of skin: Secondary | ICD-10-CM | POA: Diagnosis not present

## 2021-05-24 DIAGNOSIS — L298 Other pruritus: Secondary | ICD-10-CM | POA: Diagnosis not present

## 2021-05-24 DIAGNOSIS — Z08 Encounter for follow-up examination after completed treatment for malignant neoplasm: Secondary | ICD-10-CM | POA: Diagnosis not present

## 2021-05-24 DIAGNOSIS — L821 Other seborrheic keratosis: Secondary | ICD-10-CM | POA: Diagnosis not present

## 2021-05-24 DIAGNOSIS — L538 Other specified erythematous conditions: Secondary | ICD-10-CM | POA: Diagnosis not present

## 2021-05-24 DIAGNOSIS — L570 Actinic keratosis: Secondary | ICD-10-CM | POA: Diagnosis not present

## 2021-05-24 DIAGNOSIS — L814 Other melanin hyperpigmentation: Secondary | ICD-10-CM | POA: Diagnosis not present

## 2021-05-24 DIAGNOSIS — L820 Inflamed seborrheic keratosis: Secondary | ICD-10-CM | POA: Diagnosis not present

## 2021-05-24 DIAGNOSIS — B0229 Other postherpetic nervous system involvement: Secondary | ICD-10-CM | POA: Diagnosis not present

## 2021-05-24 MED ORDER — ATORVASTATIN CALCIUM 80 MG PO TABS: 80.0000 mg | ORAL_TABLET | Freq: Every day | ORAL | 0 refills | Status: DC

## 2021-05-24 MED ORDER — ATORVASTATIN CALCIUM 80 MG PO TABS: ORAL_TABLET | ORAL | 0 refills | Status: DC

## 2021-05-24 NOTE — Telephone Encounter (Signed)
°*  STAT* If patient is at the pharmacy, call can be transferred to refill team.   1. Which medications need to be refilled? (please list name of each medication and dose if known) atorvastatin (LIPITOR) 80 MG tablet  2. Which pharmacy/location (including street and city if local pharmacy) is medication to be sent to? [facility] - [place], [place] - [place] [address]  3. Do they need a 30 day or 90 day supply? 90 day  Patient if out of medication.

## 2021-05-24 NOTE — Addendum Note (Signed)
Addended by: Venetia Maxon on: 05/24/2021 02:14 PM   Modules accepted: Orders

## 2021-05-24 NOTE — Telephone Encounter (Signed)
Refills has been sent to the pharmacy. 

## 2021-05-26 DIAGNOSIS — G4733 Obstructive sleep apnea (adult) (pediatric): Secondary | ICD-10-CM | POA: Diagnosis not present

## 2021-06-10 ENCOUNTER — Telehealth: Payer: Self-pay | Admitting: Pulmonary Disease

## 2021-06-10 DIAGNOSIS — G4733 Obstructive sleep apnea (adult) (pediatric): Secondary | ICD-10-CM

## 2021-06-10 NOTE — Telephone Encounter (Signed)
Patient last seen 01/29/2020. Scheduled appt for 07/06/2021. ? ?He stated that he is currently traveling to [facility] and he left his head gear and hose at home. He would like an order placed to [facility] in [facility] ? ?Beth, please advise if okay to order. Dr. Elsworth Soho is unavailable.  ? ?

## 2021-06-10 NOTE — Telephone Encounter (Signed)
Order was placed for pt's hose and headgear. Order needs to be cosigned off by [facility] and then needs to be sent to the fax number provided by pt. ? ? ?Routing to both [facility] and [facility]. ?

## 2021-06-10 NOTE — Telephone Encounter (Signed)
Called and spoke to patient to let him know we don't having anything sooner.  ?

## 2021-06-10 NOTE — Telephone Encounter (Signed)
Patient is in [place], he forgot his hose and head gear for his CPAP at home. He would like for Prescription for a heated hose to be sent to [facility] in [facility].  ?[address] [place] [address] ?Fax number: [phone] ?Phone: [phone] ?

## 2021-06-10 NOTE — Telephone Encounter (Signed)
We can place an order for supplies, I thought he was referring to his apt on 07/06/21

## 2021-06-10 NOTE — Telephone Encounter (Signed)
Order placed. Patient is aware and voiced his understanding.  Nothing further needed.  

## 2021-06-10 NOTE — Telephone Encounter (Signed)
Order has been stamped and faxed confirmation received also spoke to the store where the patinet was at  ?

## 2021-06-10 NOTE — Telephone Encounter (Signed)
There is not any availability today for virtual patient. Patient needs supplies today.  ?

## 2021-06-10 NOTE — Telephone Encounter (Signed)
Patient is at the store. Patient phone number is [phone]. ?

## 2021-06-10 NOTE — Telephone Encounter (Signed)
That needs to be approved by provider seeing him. As long as we can get download form his CPAP likely can do virtual.

## 2021-07-06 ENCOUNTER — Encounter: Payer: Self-pay | Admitting: Pulmonary Disease

## 2021-07-06 ENCOUNTER — Ambulatory Visit: Payer: PPO | Admitting: Pulmonary Disease

## 2021-07-06 VITALS — BP 130/64 | HR 92 | Temp 98.5°F | Ht 68.0 in | Wt 198.4 lb

## 2021-07-06 DIAGNOSIS — G4733 Obstructive sleep apnea (adult) (pediatric): Secondary | ICD-10-CM | POA: Diagnosis not present

## 2021-07-06 DIAGNOSIS — J310 Chronic rhinitis: Secondary | ICD-10-CM

## 2021-07-06 NOTE — Patient Instructions (Signed)
?  X Written Rx for CPAP supplies  ? ? ?

## 2021-07-06 NOTE — Assessment & Plan Note (Signed)
CurrentCPAP download was reviewed on auto settings which shows good control of ?Events with average pressure of 10 cm and maximum pressure 11 cm. ?He is very compliant and CPAP is certainly helped improve his daytime somnolence and fatigue ? ?Weight loss encouraged, compliance with goal of at least 4-6 hrs every night is the expectation. ?Advised against medications with sedative side effects ?Cautioned against driving when sleepy - understanding that sleepiness will vary on a day to day basis ? ?

## 2021-07-06 NOTE — Progress Notes (Signed)
? ?  Subjective:  ? ? Patient ID: Gabriel Aguilar, male    DOB: 04/18/1950, 71 y.o.   MRN: [id number] ? ?HPI ? ?71 yo ex-smoker with atrial fibrillation for FU of obstructive sleep apnea.  ?-on nasal autoCPAP ?Diagnosed in 2003 ? ?PMH - CAD s/p stent ? ?Last office visit 01/2020 ?He called few weeks ago from [place] he had forgotten to take his hose/mask and needed supplies stat.  There was a delay from the front office in faxing prescription to his DME in [place].  He is upset about this interaction with the front office staff and provides me a written complaint. ? 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  I gave him Atrovent nasal spray and he has successfully weaned off decongestant.  Overall he has adjusted well to CPAP, very compliant with nasal mask denies any problems with mask or pressure ? ?Significant tests/ events reviewed ?PSG 11/2001 showed AHI 20/h, predom apneas, longest 70 s, corrected by CPAP 8 cm ? ? ?Review of Systems ?neg for any significant sore throat, dysphagia, itching, sneezing, nasal congestion or excess/ purulent secretions, fever, chills, sweats, unintended wt loss, pleuritic or exertional cp, hempoptysis, orthopnea pnd or change in chronic leg swelling. Also denies presyncope, palpitations, heartburn, abdominal pain, nausea, vomiting, diarrhea or change in bowel or urinary habits, dysuria,hematuria, rash, arthralgias, visual complaints, headache, numbness weakness or ataxia. ? ?   ?Objective:  ? Physical Exam ? ?Gen. Pleasant, well-nourished, in no distress ?ENT - no thrush, no pallor/icterus,no post nasal drip ?Neck: No JVD, no thyromegaly, no carotid bruits ?Lungs: no use of accessory muscles, no dullness to percussion, clear without rales or rhonchi  ?Cardiovascular: Rhythm regular, heart sounds  normal, no murmurs or gallops, no peripheral edema ?Musculoskeletal: No deformities, no cyanosis or clubbing  ? ? ? ?   ?Assessment & Plan:  ? ? ?

## 2021-07-06 NOTE — Assessment & Plan Note (Signed)
Atrovent nasal spray has worked well for him and we will continue.  He has successfully weaned off oxymetazoline ?

## 2021-07-19 ENCOUNTER — Telehealth: Payer: Self-pay | Admitting: Pharmacist

## 2021-07-19 NOTE — Chronic Care Management (AMB) (Signed)
? ? ?  Chronic Care Management ?Pharmacy Assistant  ? ?Name: Gabriel Aguilar  MRN: [id number] DOB: [birth year] ? ?07/19/21 APPOINTMENT REMINDER ? ? ? ?Patient was reminded to have all medications, supplements and any blood glucose and blood pressure readings available for review with Jeni Salles, Pharm. D, for telephone visit on 07/19/21 at 8:30. ? ? ? ?Care Gaps: ?COVID Booster - Overdue ?BP- 130/64 ( 07/06/21) ?AWV- 1/23 ? ?Star Rating Drug: ?Atorvastatin 80 mg - Last filled 05/26/21 90 DS at [facility] ? ?Any gaps in medications fill history? ? ?None ? ? ? ? ?Medications: ?Outpatient Encounter Medications as of 07/19/2021  ?Medication Sig Note  ? amLODipine (NORVASC) 10 MG tablet TAKE ONE TABLET BY MOUTH ONCE DAILY   ? Ascorbic Acid (VITAMIN C) 1000 MG tablet Take 1,000 mg by mouth daily.   ? atorvastatin (LIPITOR) 80 MG tablet Take 1 tablet (80 mg total) by mouth daily.   ? b complex vitamins capsule Take 1 capsule by mouth daily.   ? benzonatate (TESSALON PERLES) 100 MG capsule 1-2 capsules up to twice daily as needed for cough   ? clopidogrel (PLAVIX) 75 MG tablet TAKE ONE TABLET BY MOUTH EVERY MORNING with breakfast   ? dicyclomine (BENTYL) 20 MG tablet Take 1 tablet (20 mg total) by mouth 2 (two) times daily.   ? fenofibrate 54 MG tablet TAKE ONE TABLET BY MOUTH ONCE DAILY   ? hydrochlorothiazide (HYDRODIURIL) 25 MG tablet Take 1 tablet (25 mg total) by mouth daily.   ? metoprolol tartrate (LOPRESSOR) 25 MG tablet Take 0.5 tablets (12.5 mg total) by mouth every 8 (eight) hours as needed (ELEVATED HEART RATE). 03/23/2021: prn  ? niacin 500 MG tablet Take 500 mg by mouth at bedtime.   ? nitroGLYCERIN (NITROSTAT) 0.4 MG SL tablet Place 1 tablet (0.4 mg total) under the tongue every 5 (five) minutes as needed for chest pain. 03/23/2021: Has on hand  ? omeprazole (PRILOSEC) 40 MG capsule TAKE ONE CAPSULE BY MOUTH ONCE DAILY   ? valACYclovir (VALTREX) 1000 MG tablet Take 1 tablet (1,000 mg total) by mouth 3 (three) times  daily.   ? XARELTO 20 MG TABS tablet TAKE ONE TABLET BY MOUTH ONCE DAILY WITH SUPPER 08/26/2020: Last dose 08/23/20 for colonoscopy  ? ?No facility-administered encounter medications on file as of 07/19/2021.  ? ? ? ?Ned Clines CMA ?Clinical Pharmacist Assistant ?[phone] ? ?

## 2021-07-20 ENCOUNTER — Ambulatory Visit (INDEPENDENT_AMBULATORY_CARE_PROVIDER_SITE_OTHER): Payer: PPO | Admitting: Pharmacist

## 2021-07-20 DIAGNOSIS — E785 Hyperlipidemia, unspecified: Secondary | ICD-10-CM

## 2021-07-20 DIAGNOSIS — I10 Essential (primary) hypertension: Secondary | ICD-10-CM

## 2021-07-20 NOTE — Progress Notes (Signed)
? ?Chronic Care Management ?Pharmacy Note ? ?07/20/2021 ?Name:  Gabriel Aguilar MRN:  [id number] DOB:  July 27, 1950 ? ?Summary: ?BP at goal < 130/80 per home readings ?LDL not at goal < 70 ?  ?Recommendations/Changes made from today's visit: ?-Recommend rechecking lipid panel and consider starting Zetia if LDL > 70 ?-Recommended discussion with dermatology to avoid drug interaction with niacin to switch for a topical ?  ?Plan: ?Follow up BP assessment in 3 months ?Follow up in 6 months ? ?Subjective: ?Gabriel Aguilar is an 71 y.o. year old male who is a primary patient of Laurey Morale, MD.  The CCM team was consulted for assistance with disease management and care coordination needs.   ? ?Engaged with patient by telephone for follow up visit in response to provider referral for pharmacy case management and/or care coordination services.  ? ?Consent to Services:  ?The patient was given information about Chronic Care Management services, agreed to services, and gave verbal consent prior to initiation of services.  Please see initial visit note for detailed documentation.  ? ?Patient Care Team: ?Laurey Morale, MD as PCP - General (Family Medicine) ?[name], MD as PCP - Cardiology (Cardiology) ?Thompson Grayer, MD as PCP - Electrophysiology (Cardiology) ?Rigoberto Noel, MD as Consulting Physician (Pulmonary Disease) ?Magrinat, Virgie Dad, MD (Inactive) as Consulting Physician (Oncology) ?Viona Gilmore, [facility] as Pharmacist (Pharmacist) ? ?Recent office visits: ?05/04/21 Criselda Peaches, LPN - Patient presented for Medicare Annual Wellness Exam. No medication changes. ?  ?04/19/21 Laurey Morale, MD - Patient presented for COVID infection. Prescribed Nirmatrelvir - Ritonavir. Stopped Amoxicillin. ? ?Recent consult visits: ?07/06/21 Kara Mead, MD (pulmonology): Patient presented for OSA follow up. Prescribed CPAP supplies. Follow up in 1 year. ? ?05/24/21 Gloris Manchester, PA (dermatology): Patient presented for follow up.  Unable to access notes. ? ?02/22/21 Lucretia Kern, DO - Patient presented for Nasal congestion and other concerns. Prescribed Amoxicillin and Benzonatate. Stopped Hydrocortisone. ?  ?Hospital visits: ?Medication Reconciliation was completed by comparing discharge summary, patient?s EMR and Pharmacy list, and upon discussion with patient. ?  ?Patient presented to [facility] ED on 03/23/21 due to Herpes zoster without complication and other concerns. Patient was present for 3 hours. ?  ?New?Medications Started at [facility] Discharge:?? ?-started ?dicyclomine 20 MG tablet ?valACYclovir 1000 MG tablet ?  ?Medication Changes at Hospital Discharge: ?-Changed  ?none ?  ?Medications Discontinued at Hospital Discharge: ?-Stopped  ?none ?  ?Medications that remain the same after Hospital Discharge:??  ?-All other medications will remain the same.   ? ?Objective: ? ?Lab Results  ?Component Value Date  ? CREATININE 1.07 03/23/2021  ? BUN 17 03/23/2021  ? GFR 65.30 08/04/2020  ? GFRNONAA >60 03/23/2021  ? GFRAA >60 05/30/2018  ? NA 136 03/23/2021  ? K 3.6 03/23/2021  ? CALCIUM 10.0 03/23/2021  ? CO2 28 03/23/2021  ? GLUCOSE 96 03/23/2021  ? ? ?Lab Results  ?Component Value Date/Time  ? HGBA1C 5.9 08/04/2020 09:14 AM  ? HGBA1C 5.7 06/06/2019 10:28 AM  ? GFR 65.30 08/04/2020 09:14 AM  ? GFR 66.08 06/06/2019 10:28 AM  ?  ?Last diabetic Eye exam: No results found for: HMDIABEYEEXA  ?Last diabetic Foot exam: No results found for: HMDIABFOOTEX  ? ?Lab Results  ?Component Value Date  ? CHOL 161 08/04/2020  ? HDL 48.10 08/04/2020  ? [place] 90 08/04/2020  ? LDLDIRECT 97.0 05/14/2018  ? TRIG 113.0 08/04/2020  ? CHOLHDL 3 08/04/2020  ? ? ? ?  Latest Ref Rng & Units 03/23/2021  ? 11:46 AM 08/04/2020  ?  9:14 AM 05/14/2018  ?  7:55 AM  ?Hepatic Function  ?Total Protein 6.5 - 8.1 g/dL 7.5   7.1   6.5    ?Albumin 3.5 - 5.0 g/dL 4.5   4.5   4.4    ?AST 15 - 41 U/L 27   42   22    ?ALT 0 - 44 U/L 30   43   33    ?Alk Phosphatase 38 - 126 U/L 49    56   60    ?Total Bilirubin 0.3 - 1.2 mg/dL 0.9   0.7   0.6    ?Bilirubin, Direct 0.0 - 0.3 mg/dL  0.1   0.1    ? ? ?Lab Results  ?Component Value Date/Time  ? TSH 0.69 08/04/2020 09:14 AM  ? TSH 0.91 06/06/2019 10:28 AM  ? FREET4 0.77 08/04/2020 09:14 AM  ? ? ? ?  Latest Ref Rng & Units 03/23/2021  ? 11:46 AM 08/04/2020  ?  9:14 AM 07/12/2020  ?  9:25 AM  ?CBC  ?WBC 4.0 - 10.5 K/uL 11.1   11.2   11.9    ?Hemoglobin 13.0 - 17.0 g/dL 14.1   13.7   13.6    ?Hematocrit 39.0 - 52.0 % 41.5   38.9   39.3    ?Platelets 150 - 400 K/uL 317   398.0   376    ? ? ?No results found for: VD25OH ? ?Clinical ASCVD: Yes  ?The ASCVD Risk score (Arnett DK, et al., 2019) failed to calculate for the following reasons: ?  The patient has a prior MI or stroke diagnosis   ? ? ?  05/04/2021  ?  3:24 PM 08/04/2020  ?  8:32 AM 03/17/2020  ?  2:52 PM  ?Depression screen PHQ 2/9  ?Decreased Interest 0 0 0  ?Down, Depressed, Hopeless 0 0 0  ?PHQ - 2 Score 0 0 0  ?Altered sleeping  0 0  ?Tired, decreased energy  1 0  ?Change in appetite  0 0  ?Feeling bad or failure about yourself   0 0  ?Trouble concentrating  0 0  ?Moving slowly or fidgety/restless  0 0  ?Suicidal thoughts  0 0  ?PHQ-9 Score  1 0  ?Difficult doing work/chores  Not difficult at all Not difficult at all  ?  ? ?CHA2DS2/VAS Stroke Risk Points  Current as of 4 hours ago  ?   3 >= 2 Points: High Risk  ?1 - 1.99 Points: Medium Risk  ?0 Points: Low Risk  ?  Last Change: N/A   ?  ? Details   ? This score determines the patient's risk of having a stroke if the  ?patient has atrial fibrillation.  ?  ?  ? Points Metrics  ?0 Has Congestive Heart Failure:  No   ? Current as of 4 hours ago  ?1 Has Vascular Disease:  Yes   ? Current as of 4 hours ago  ?1 Has Hypertension:  Yes   ? Current as of 4 hours ago  ?1 Age:  61   ? Current as of 4 hours ago  ?0 Has Diabetes:  No   ? Current as of 4 hours ago  ?0 Had Stroke:  No  Had TIA:  No  Had Thromboembolism:  No   ? Current as of 4 hours ago  ?0  Male:  No   ?  Current as of 4 hours ago  ?  ?  ? ?Social History  ? ?Tobacco Use  ?Smoking Status Former  ? Packs/day: 1.50  ? Types: Cigarettes  ? Start date: 04/10/1964  ? Quit date: 03/06/1983  ? Years since quitting: 38.4  ?Smokeless Tobacco Never  ? ?BP Readings from Last 3 Encounters:  ?07/06/21 130/64  ?04/19/21 128/63  ?03/23/21 134/69  ? ?Pulse Readings from Last 3 Encounters:  ?07/06/21 92  ?04/19/21 86  ?03/23/21 65  ? ?Wt Readings from Last 3 Encounters:  ?07/06/21 198 lb 6.4 oz (90 kg)  ?05/04/21 202 lb (91.6 kg)  ?03/23/21 195 lb 5.2 oz (88.6 kg)  ? ?BMI Readings from Last 3 Encounters:  ?07/06/21 30.17 kg/m?  ?05/04/21 30.71 kg/m?  ?03/23/21 29.70 kg/m?  ? ? ?Assessment/Interventions: Review of patient past medical history, allergies, medications, health status, including review of consultants reports, laboratory and other test data, was performed as part of comprehensive evaluation and provision of chronic care management services.  ? ?SDOH:  (Social Determinants of Health) assessments and interventions performed: No ? ?SDOH Screenings  ? ?Alcohol Screen: Not on file  ?Depression (PHQ2-9): Low Risk   ? PHQ-2 Score: 0  ?Financial Resource Strain: Low Risk   ? Difficulty of Paying Living Expenses: Not hard at all  ?Food Insecurity: No Food Insecurity  ? Worried About Charity fundraiser in the Last Year: Never true  ? Ran Out of Food in the Last Year: Never true  ?Housing: Low Risk   ? Last Housing Risk Score: 0  ?Physical Activity: Sufficiently Active  ? Days of Exercise per Week: 5 days  ? Minutes of Exercise per Session: 30 min  ?Social Connections: Socially Integrated  ? Frequency of Communication with Friends and Family: More than three times a week  ? Frequency of Social Gatherings with Friends and Family: More than three times a week  ? Attends Religious Services: More than 4 times per year  ? Active Member of Clubs or Organizations: Yes  ? Attends Archivist Meetings: More than 4  times per year  ? Marital Status: Married  ?Stress: No Stress Concern Present  ? Feeling of Stress : Not at all  ?Tobacco Use: Medium Risk  ? Smoking Tobacco Use: Former  ? Smokeless Tobacco Use: Never  ? Pa

## 2021-07-20 NOTE — Patient Instructions (Signed)
Hi Gabriel Aguilar, ? ?It was great to catch up with you again! I am glad you are feeling better. ? ?Don't forget to ask your dermatologist next month about getting the shingles shot and discuss the use of niacin to try to avoid a potential drug interaction with your cholesterol medication. See if a topical version would work just as well for what you are using it for. ? ?Please reach out to me if you have any questions or need anything before our follow up! ? ?Best, ?Maddie ? ?Jeni Salles, PharmD, BCACP ?Clinical Pharmacist ?Therapist, music at [place] ?[phone] ? ? Visit Information ? ? Goals Addressed   ?None ?  ? ?Patient Care Plan: [place]  ?  ? ?Problem Identified: Problem:  HTN, HLD, Paroxysmal Afib, CAD/NSTEMI, GERD   ?  ? ?Long-Range Goal: Patient-Specific Goal   ?Start Date: 07/26/2020  ?Expected End Date: 07/26/2021  ?Recent Progress: On track  ?Priority: High  ?Note:   ?Current Barriers:  ?Unable to independently monitor therapeutic efficacy ?Unable to achieve control of cholesterol  ? ?Pharmacist Clinical Goal(s):  ?Patient will achieve adherence to monitoring guidelines and medication adherence to achieve therapeutic efficacy ?achieve control of cholesterol as evidenced by next lipid panel  through collaboration with PharmD and provider.  ? ?Interventions: ?1:1 collaboration with Laurey Morale, MD regarding development and update of comprehensive plan of care as evidenced by provider attestation and co-signature ?Inter-disciplinary care team collaboration (see longitudinal plan of care) ?Comprehensive medication review performed; medication list updated in electronic medical record ? ?Hypertension (BP goal <130/80) ?-Controlled (per home readings) ?-Current treatment: ?Hydrochlorothiazide '25mg'$ , 1 tablet once daily - Appropriate, Effective, Safe, Accessible ?Amlodipine '10mg'$ , 1 tablet once daily - Appropriate, Effective, Safe, Accessible ?-Medications previously tried: none  ?-Current  home readings: 127/69, 136/64 HR 62 ?-Current dietary habits: tries to limit salt intake ?-Current exercise habits: just started back at the gym this week ?-Denies hypotensive/hypertensive symptoms ?-Educated on Exercise goal of 150 minutes per week; ?Importance of home blood pressure monitoring; ?Proper BP monitoring technique; ?-Counseled to monitor BP at home weekly, document, and provide log at future appointments ?-Counseled on diet and exercise extensively ?Recommended to continue current medication ?Recommended checking BP at least minimally when feeling dizzy/lightheaded. ? ?Hyperlipidemia: (LDL goal < 70) ?-Uncontrolled ?-Current treatment: ?Atorvastatin '80mg'$ , 1 tablet once daily at 6 pm ?Fenofibrate '54mg'$ , 1 tablet once daily ?-Medications previously tried: fish oil (patient stopped on his own) ?-Current dietary patterns: does a lot of baking and grilling; oatmeal for breakfast; has been eating out more and more red meat than usual ?-Current exercise habits: goes to the gym every day and uses the treadmill some days and weights most days ?-Educated on Cholesterol goals;  ?Importance of limiting foods high in cholesterol; ?Exercise goal of 150 minutes per week; ?-Counseled on diet and exercise extensively ?Recommended to continue current medication ?Recommended follow up with cardiology to recheck cholesterol ? ?Atrial Fibrillation (Goal: prevent stroke and major bleeding) ?-Controlled ?-CHADSVASC: 3 ?-Current treatment: ?Rate control: metoprolol as needed - Appropriate, Effective, Safe, Accessible ?Anticoagulation: Xarelto '20mg'$  1 tablet once daily - Appropriate, Effective, Safe, Accessible ?-Medications previously tried: 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) ?-Home BP and HR readings: does not check regularly  ?-Counseled on bleeding risk associated with Xarelto and importance of self-monitoring for signs/symptoms of bleeding; ?avoidance of NSAIDs due to increased bleeding risk with anticoagulants; ?-Recommended  to continue current medication ?Educated on triggers of Afib including chocolate, alcohol and caffeine. ? ?CAD/history of MI (  Goal: prevent future heart attacks and strokes) ?-Controlled ?-Current treatment  ?Clopidogrel '75mg'$ , 1 tablet once daily - Appropriate, Effective, Safe, Accessible ?Nitroglycerin 0.'4mg'$ , 1 tablet under tongue as needed for chest pain - Appropriate, Effective, Safe, Accessible ?-Medications previously tried: none  ?-Recommended to continue current medication ? ?GERD (Goal: minimize symptoms of acid reflux/heartburn) ?-Controlled ?-Current treatment  ?Omeprazole '40mg'$ , 1 capsule once daily - Appropriate, Effective, Safe, Accessible ?-Medications previously tried: none  ?-Counseled on non-pharmacological interventions for acid reflux. Take measures to prevent acid reflux, such as avoiding spicy foods, avoiding caffeine, avoid laying down a few hours after eating, and raising the head of the bed. ? ?Allergic rhinitis (Goal: minimize symptoms of allergies) ?-Uncontrolled ?-Current treatment  ?Afrin only as needed (works through the night) - Appropriate, Effective, Query Safe, Accessible ?Fluticasone 65mg/act nasal spray, 1 spray into both nostrils at bedtime - not taking - Appropriate, Effective, Safe, Accessible ?Claritin 10 mg 1 tablet at night - Appropriate, Effective, Safe, Accessible ?-Medications previously tried: ipratropium (ineffective)  ?-Counseled on limiting use of Afrin to 3 consecutive days due to rebound congestion and increased BP ?Recommended trial of Flonase to prevent overuse of Afrin and can consider switching to alternative antihistamine such as Zyrtec or Allegra if Claritin is not helping ? ? ?Health Maintenance ?-Vaccine gaps: shingles, tetanus ?-Current therapy:  ?Ascorbic acid (Vitamin C) '1000mg'$ , 1 tablet once daily (recommended to drink plenty water with it)  ?Vitamin B complex daily ?Weider support prostate health daily ?Niacin 500 mg daily (for skin) ?-Educated on Cost  vs benefit of each product must be carefully weighed by individual consumer ?-Patient is satisfied with current therapy and denies issues ?-Recommended to continue current medication ? ?Patient Goals/Self-Care Activities ?Patient will:  ?- take medications as prescribed ?check blood pressure weekly, document, and provide at future appointments ?target a minimum of 150 minutes of moderate intensity exercise weekly ? ?Follow Up Plan: Telephone follow up appointment with care management team member scheduled for: 6 months ? ?  ?  ? ?Patient verbalizes understanding of instructions and care plan provided today and agrees to view in [place] [facility] [place] status confirmed with patient.   ?Telephone follow up appointment with pharmacy team member scheduled for: 6 months ? ?MViona Gilmore RPH  ?

## 2021-08-07 DIAGNOSIS — I10 Essential (primary) hypertension: Secondary | ICD-10-CM | POA: Diagnosis not present

## 2021-08-07 DIAGNOSIS — E785 Hyperlipidemia, unspecified: Secondary | ICD-10-CM

## 2021-08-08 ENCOUNTER — Encounter: Payer: Self-pay | Admitting: Family Medicine

## 2021-08-08 ENCOUNTER — Ambulatory Visit (INDEPENDENT_AMBULATORY_CARE_PROVIDER_SITE_OTHER): Payer: PPO | Admitting: Family Medicine

## 2021-08-08 VITALS — BP 110/68 | HR 61 | Temp 98.2°F | Ht 68.0 in | Wt 194.0 lb

## 2021-08-08 DIAGNOSIS — Z0000 Encounter for general adult medical examination without abnormal findings: Secondary | ICD-10-CM

## 2021-08-08 DIAGNOSIS — R972 Elevated prostate specific antigen [PSA]: Secondary | ICD-10-CM

## 2021-08-08 LAB — TSH: TSH: 0.78 u[IU]/mL (ref 0.35–5.50)

## 2021-08-08 LAB — BASIC METABOLIC PANEL
BUN: 20 mg/dL (ref 6–23)
CO2: 30 mEq/L (ref 19–32)
Calcium: 9.8 mg/dL (ref 8.4–10.5)
Chloride: 97 mEq/L (ref 96–112)
Creatinine, Ser: 1.14 mg/dL (ref 0.40–1.50)
GFR: 64.16 mL/min (ref >60.00)
Glucose, Bld: 101 mg/dL — ABNORMAL HIGH (ref 70–99)
Potassium: 3.9 mEq/L (ref 3.5–5.1)
Sodium: 137 mEq/L (ref 135–145)

## 2021-08-08 LAB — CBC WITH DIFFERENTIAL/PLATELET
Basophils Absolute: 0 10*3/uL (ref 0.0–0.1)
Basophils Relative: 0.4 % (ref 0.0–3.0)
Eosinophils Absolute: 0.3 10*3/uL (ref 0.0–0.7)
Eosinophils Relative: 2.2 % (ref 0.0–5.0)
HCT: 41.8 % (ref 39.0–52.0)
Hemoglobin: 13.9 g/dL (ref 13.0–17.0)
Lymphocytes Relative: 36.5 % (ref 12.0–46.0)
Lymphs Abs: 4.4 10*3/uL — ABNORMAL HIGH (ref 0.7–4.0)
MCHC: 33.3 g/dL (ref 30.0–36.0)
MCV: 93.1 fl (ref 78.0–100.0)
Monocytes Absolute: 1.1 10*3/uL — ABNORMAL HIGH (ref 0.1–1.0)
Monocytes Relative: 8.9 % (ref 3.0–12.0)
Neutro Abs: 6.3 10*3/uL (ref 1.4–7.7)
Neutrophils Relative %: 52 % (ref 43.0–77.0)
Platelets: 456 10*3/uL — ABNORMAL HIGH (ref 150.0–400.0)
RBC: 4.49 Mil/uL (ref 4.22–5.81)
RDW: 13.6 % (ref 11.5–15.5)
WBC: 12 10*3/uL — ABNORMAL HIGH (ref 4.0–10.5)

## 2021-08-08 LAB — HEPATIC FUNCTION PANEL
ALT: 41 U/L (ref 0–53)
AST: 60 U/L — ABNORMAL HIGH (ref 0–37)
Albumin: 4.6 g/dL (ref 3.5–5.2)
Alkaline Phosphatase: 66 U/L (ref 39–117)
Bilirubin, Direct: 0.2 mg/dL (ref 0.0–0.3)
Total Bilirubin: 0.7 mg/dL (ref 0.2–1.2)
Total Protein: 7.4 g/dL (ref 6.0–8.3)

## 2021-08-08 LAB — LIPID PANEL
Cholesterol: 165 mg/dL (ref 0–200)
HDL: 53.7 mg/dL (ref >39.00)
LDL Cholesterol: 94 mg/dL (ref 0–99)
NonHDL: 111.25
Total CHOL/HDL Ratio: 3
Triglycerides: 86 mg/dL (ref 0.0–149.0)
VLDL: 17.2 mg/dL (ref 0.0–40.0)

## 2021-08-08 LAB — VITAMIN B12: Vitamin B-12: 535 pg/mL (ref 211–911)

## 2021-08-08 LAB — HEMOGLOBIN A1C: Hgb A1c MFr Bld: 5.7 % (ref 4.6–6.5)

## 2021-08-08 LAB — PSA: PSA: 4.55 ng/mL — ABNORMAL HIGH (ref 0.10–4.00)

## 2021-08-08 MED ORDER — LORAZEPAM 0.5 MG PO TABS: 0.5000 mg | ORAL_TABLET | Freq: Every day | ORAL | 5 refills | Status: DC

## 2021-08-08 NOTE — Progress Notes (Signed)
? ?Subjective:  ? ? Patient ID: Gabriel Aguilar, male    DOB: [birth year], 71 y.o.   MRN: [id number] ? ?HPI ?Here for a well exam. He has a few issues to discuss. First he says he has felt more tired than usual for a few months. He has to stop to rest 3-4 times when he is mowing his lawn for instance because he feels fatigued and out of breath. No chest pain or pressure. He has not seen Cardiology for well over a year. He also has trouble sleeping. Part of this is due to nasal congestion when he lies in bed, and this makes using his nasal CPAP very difficult. He had used Afrin in the past but Dr. Elsworth Soho convinced him to stop this. He has also tried Flonase and Atrovent sprays with no relief. Currently he is using saline sprays. He also describes trouble relaxing at night. He often feels anxious and cannot relax.  ? ? ?Review of Systems  ?Constitutional:  Positive for fatigue.  ?HENT: Negative.    ?Eyes: Negative.   ?Respiratory:  Positive for shortness of breath.   ?Cardiovascular: Negative.   ?Gastrointestinal: Negative.   ?Genitourinary: Negative.   ?Musculoskeletal: Negative.   ?Skin: Negative.   ?Neurological: Negative.   ?Psychiatric/Behavioral:  Positive for sleep disturbance.   ? ?   ?Objective:  ? Physical Exam ?Constitutional:   ?   General: He is not in acute distress. ?   Appearance: Normal appearance. He is well-developed. He is not diaphoretic.  ?HENT:  ?   Head: Normocephalic and atraumatic.  ?   Right Ear: External ear normal.  ?   Left Ear: External ear normal.  ?   Nose: Nose normal.  ?   Mouth/Throat:  ?   Pharynx: No oropharyngeal exudate.  ?Eyes:  ?   General: No scleral icterus.    ?   Right eye: No discharge.     ?   Left eye: No discharge.  ?   Conjunctiva/sclera: Conjunctivae normal.  ?   Pupils: Pupils are equal, round, and reactive to light.  ?Neck:  ?   Thyroid: No thyromegaly.  ?   Vascular: No JVD.  ?   Trachea: No tracheal deviation.  ?Cardiovascular:  ?   Rate and Rhythm: Normal rate and  regular rhythm.  ?   Heart sounds: Normal heart sounds. No murmur heard. ?  No friction rub. No gallop.  ?Pulmonary:  ?   Effort: Pulmonary effort is normal. No respiratory distress.  ?   Breath sounds: Normal breath sounds. No wheezing or rales.  ?Chest:  ?   Chest wall: No tenderness.  ?Abdominal:  ?   General: Bowel sounds are normal. There is no distension.  ?   Palpations: Abdomen is soft. There is no mass.  ?   Tenderness: There is no abdominal tenderness. There is no guarding or rebound.  ?Genitourinary: ?   Penis: Normal. No tenderness.   ?   Testes: Normal.  ?   Prostate: Normal.  ?   Rectum: Normal. Guaiac result negative.  ?Musculoskeletal:     ?   General: No tenderness. Normal range of motion.  ?   Cervical back: Neck supple.  ?Lymphadenopathy:  ?   Cervical: No cervical adenopathy.  ?Skin: ?   General: Skin is warm and dry.  ?   Coloration: Skin is not pale.  ?   Findings: No erythema or rash.  ?Neurological:  ?   Mental Status: He is  alert and oriented to person, place, and time.  ?   Cranial Nerves: No cranial nerve deficit.  ?   Motor: No abnormal muscle tone.  ?   Coordination: Coordination normal.  ?   Deep Tendon Reflexes: Reflexes are normal and symmetric. Reflexes normal.  ?Psychiatric:     ?   Behavior: Behavior normal.     ?   Thought Content: Thought content normal.     ?   Judgment: Judgment normal.  ? ? ? ? ? ?   ?Assessment & Plan:  ?Well exam. We discussed diet and exercise. Get fasting labs. We will look for many possible etiologies of his fatigue and SOB with the lab results, but I also urged him to follow up with Dr. [name] asap. For sleep, he will try Lorazepam 0.5 mg at bedtime.  ?Alysia Penna, MD ? ? ?

## 2021-08-09 NOTE — Addendum Note (Signed)
Addended by: Alysia Penna A on: 08/09/2021 07:57 AM ? ? Modules accepted: Orders ? ?

## 2021-08-11 DIAGNOSIS — L570 Actinic keratosis: Secondary | ICD-10-CM | POA: Diagnosis not present

## 2021-08-11 DIAGNOSIS — L820 Inflamed seborrheic keratosis: Secondary | ICD-10-CM | POA: Diagnosis not present

## 2021-08-11 DIAGNOSIS — L298 Other pruritus: Secondary | ICD-10-CM | POA: Diagnosis not present

## 2021-08-11 DIAGNOSIS — B0229 Other postherpetic nervous system involvement: Secondary | ICD-10-CM | POA: Diagnosis not present

## 2021-08-11 DIAGNOSIS — L538 Other specified erythematous conditions: Secondary | ICD-10-CM | POA: Diagnosis not present

## 2021-08-11 DIAGNOSIS — R208 Other disturbances of skin sensation: Secondary | ICD-10-CM | POA: Diagnosis not present

## 2021-08-22 ENCOUNTER — Telehealth: Payer: Self-pay | Admitting: Pharmacist

## 2021-08-22 ENCOUNTER — Other Ambulatory Visit: Payer: Self-pay | Admitting: Cardiology

## 2021-08-22 NOTE — Progress Notes (Addendum)
Chronic Care Management Pharmacy Assistant   Name: Gabriel Aguilar  MRN: [id number] DOB: 06/29/1950  Reason for Encounter: Medication Review Medication Coordination    Recent office visits:  08/08/21 Laurey Morale, MD - Patient presented for Preventative health care and other concerns. Prescribed Lorazepam  Recent consult visits:  None  Hospital visits:  Medication Reconciliation was completed by comparing discharge summary, patient's EMR and Pharmacy list, and upon discussion with patient.   Patient presented to [facility] ED on 03/23/21 due to Herpes zoster without complication and other concerns. Patient was present for 3 hours.   New?Medications Started at [facility] Discharge:?? -started dicyclomine 20 MG tablet valACYclovir 1000 MG tablet   Medication Changes at Hospital Discharge: -Changed  none   Medications Discontinued at Hospital Discharge: -Stopped  none   Medications that remain the same after Hospital Discharge:??  -All other medications will remain the same.      Medications: Outpatient Encounter Medications as of 08/22/2021  Medication Sig Note   amLODipine (NORVASC) 10 MG tablet TAKE ONE TABLET BY MOUTH ONCE DAILY    atorvastatin (LIPITOR) 80 MG tablet TAKE ONE TABLET BY MOUTH ONCE DAILY    clopidogrel (PLAVIX) 75 MG tablet TAKE ONE TABLET BY MOUTH EVERY MORNING with breakfast    fenofibrate 54 MG tablet TAKE ONE TABLET BY MOUTH ONCE DAILY    hydrochlorothiazide (HYDRODIURIL) 25 MG tablet TAKE ONE TABLET BY MOUTH ONCE DAILY    Ascorbic Acid (VITAMIN C) 1000 MG tablet Take 1,000 mg by mouth daily.    b complex vitamins capsule Take 1 capsule by mouth daily.    dicyclomine (BENTYL) 20 MG tablet Take 1 tablet (20 mg total) by mouth 2 (two) times daily.    LORazepam (ATIVAN) 0.5 MG tablet Take 1 tablet (0.5 mg total) by mouth at bedtime.    metoprolol tartrate (LOPRESSOR) 25 MG tablet Take 0.5 tablets (12.5 mg total) by mouth every 8 (eight) hours as  needed (ELEVATED HEART RATE). 03/23/2021: prn   niacin 500 MG tablet Take 500 mg by mouth at bedtime.    nitroGLYCERIN (NITROSTAT) 0.4 MG SL tablet Place 1 tablet (0.4 mg total) under the tongue every 5 (five) minutes as needed for chest pain. 03/23/2021: Has on hand   omeprazole (PRILOSEC) 40 MG capsule TAKE ONE CAPSULE BY MOUTH ONCE DAILY    triamcinolone cream (KENALOG) 0.1 % Apply topically 2 (two) times daily.    XARELTO 20 MG TABS tablet TAKE ONE TABLET BY MOUTH ONCE DAILY WITH SUPPER 08/26/2020: Last dose 08/23/20 for colonoscopy   No facility-administered encounter medications on file as of 08/22/2021.   Reviewed chart for medication changes ahead of medication coordination call.  No OVs, Consults, or hospital visits since last care coordination call/Pharmacist visit.   No medication changes indicated  BP Readings from Last 3 Encounters:  08/08/21 110/68  07/06/21 130/64  04/19/21 128/63    Lab Results  Component Value Date   HGBA1C 5.7 08/08/2021     Patient obtains medications through Vials 90 Days   Last adherence delivery included: Fenofibrate 54 mg: one tab once a day Omeprazole 40 mg: one capsule daily  Clopidogrel 75 mg: one tab once a day Amlodipine 10 mg: one tab once daily HCTZ 25 mg : one tab once a day Metoprolol Tar 25 mg: 12.5 mg every 8 hours as needed    Patient Declined due to on hand supply Omeprazole 40 mg: one capsule daily  Metoprolol Tar 25 mg: 12.5  mg every 8 hours as needed    Patient needed refills for: Atorvastatin 80 mg ; Requested prescription be sent to [facility] Fluorouracil Ointment : Patient requested approval from provider   Patient is due for next adherence delivery on: 09/01/21. Called patient and reviewed medications and coordinated delivery. Vials with safety caps for 90 DS  This delivery to include: Fenofibrate 54 mg: one tab once a day Clopidogrel 75 mg: one tab once a day Amlodipine 10 mg: one tab once daily HCTZ 25 mg :  one tab once a day Atorvastatin 80 mg: one tab once a day   Patient reports he is not needing omeprazole at this time  Confirmed delivery date of 09/01/21,   advised patient that pharmacy will contact him the morning of delivery.     LORAZEPAM 0.5 MG TABLET 08/08/2021 30    Care Gaps: Zoster Vaccine - Overdue COVID Booster - Postponed BP- 110/68 ( 08/08/21) AWV- 10/23  Star Rating Drugs: Atorvastatin 80 mg - Last filled 05/26/21 90 DS at [facility] Pharmacist Assistant [phone]

## 2021-08-25 NOTE — Progress Notes (Signed)
Patient placed call to me reports he is wanting to add the omeprazole after all to have a bottle for traveling, notified pharm/ updated form.   [place] Clinical Pharmacist Assistant [phone]

## 2021-08-31 ENCOUNTER — Encounter: Payer: Self-pay | Admitting: Cardiology

## 2021-08-31 DIAGNOSIS — E118 Type 2 diabetes mellitus with unspecified complications: Secondary | ICD-10-CM | POA: Insufficient documentation

## 2021-08-31 HISTORY — DX: Type 2 diabetes mellitus with unspecified complications: E11.8

## 2021-08-31 NOTE — Progress Notes (Addendum)
Cardiology Office Note   Date:  09/01/2021   ID:  Gabriel Aguilar, DOB [birth year], MRN [id number]  PCP:  Laurey Morale, MD  Cardiologist:   [name], MD   Chief Complaint  Patient presents with   Coronary Artery Disease      History of Present Illness: Gabriel Aguilar is a 71 y.o. male who presents for follow up of CAD and PAF.  Since I last saw him he has done well. The patient denies any new symptoms such as chest discomfort, neck or arm discomfort. There has been no new shortness of breath, PND or orthopnea. There have been no reported palpitations, presyncope or syncope.   He says he does some exercising.  He has not been as consistent because he has been busy at work.  However, with his walking or yard work he denies any cardiovascular symptoms.    He has had none of the palpitations that he had previously when he had his paroxysms of atrial fibrillation.  Past Medical History:  Diagnosis Date   Arthritis    Coronary artery disease    Erectile dysfunction    GERD (gastroesophageal reflux disease)    Hiatal hernia    History of pericarditis    HTN (hypertension)    Hyperlipidemia LDL goal <70    Hypogonadism male    has seen  Dr. Orland Mustard   Palpitation    a. montior 2 weeks 2014--> no arrhythmia   Paroxysmal atrial fibrillation ([place])    Sleep apnea, central    wears a CPAP at night    Type 2 diabetes mellitus with complication, without long-term current use of insulin ([place]) 08/31/2021    Past Surgical History:  Procedure Laterality Date   BACK SURGERY  1994   L4-5   CARDIAC CATHETERIZATION     COLONOSCOPY  08/26/2020   per Dr. 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     b complex vitamins capsule Take 1 capsule by mouth daily.     clopidogrel (PLAVIX) 75 MG tablet TAKE ONE TABLET BY MOUTH EVERY MORNING with breakfast 90 tablet 1   dicyclomine (BENTYL) 20 MG tablet Take 1 tablet (20 mg total) by mouth 2 (two) times daily. 20 tablet 0   fenofibrate 54 MG tablet TAKE ONE TABLET BY MOUTH ONCE DAILY 90 tablet 1   hydrochlorothiazide (HYDRODIURIL) 25 MG tablet TAKE ONE TABLET BY MOUTH ONCE DAILY 90 tablet 1   LORazepam (ATIVAN) 0.5 MG tablet Take 1 tablet (0.5 mg total) by mouth at bedtime. 30 tablet 5   metoprolol tartrate (LOPRESSOR) 25  MG tablet Take 0.5 tablets (12.5 mg total) by mouth every 8 (eight) hours as needed (ELEVATED HEART RATE). 45 tablet 3   niacin 500 MG tablet Take 500 mg by mouth at bedtime.     nitroGLYCERIN (NITROSTAT) 0.4 MG SL tablet Place 1 tablet (0.4 mg total) under the tongue every 5 (five) minutes as needed for chest pain. 25 tablet 1   omeprazole (PRILOSEC) 40 MG capsule TAKE ONE CAPSULE BY MOUTH ONCE DAILY 90 capsule 0   triamcinolone cream (KENALOG) 0.1 % Apply topically 2 (two) times daily.     XARELTO 20 MG TABS tablet TAKE ONE TABLET BY MOUTH ONCE DAILY WITH SUPPER 90 tablet 1   rosuvastatin (CRESTOR) 40 MG tablet Take 1 tablet (40 mg total) by mouth daily. 90 tablet 3   No current facility-administered medications for this visit.    Allergies:   Patient has no known allergies.    ROS:  Please see the history of present  illness.   Otherwise, review of systems are positive for none.   All other systems are reviewed and negative.    PHYSICAL EXAM: VS:  BP 116/70   Pulse 66   Ht '5\' 8"'$  (1.727 m)   Wt 198 lb 9.6 oz (90.1 kg)   SpO2 93%   BMI 30.20 kg/m  , BMI Body mass index is 30.2 kg/m. GENERAL:  Well appearing NECK:  No jugular venous distention, waveform within normal limits, carotid upstroke brisk and symmetric, no bruits, no thyromegaly LUNGS:  Clear to auscultation bilaterally CHEST:  Unremarkable HEART:  PMI not displaced or sustained,S1 and S2 within normal limits, no S3, no S4, no clicks, no rubs, no murmurs ABD:  Flat, positive bowel sounds normal in frequency in pitch, no bruits, no rebound, no guarding, no midline pulsatile mass, no hepatomegaly, no splenomegaly EXT:  2 plus pulses throughout, no edema, no cyanosis no clubbing   EKG:  EKG is   ordered today. The ekg ordered today demonstrates sinus rhythm, rate 66, axis within normal limits, intervals within normal limits, no acute ST-T wave changes.     Recent Labs: 08/08/2021: ALT 41; BUN 20; Creatinine, Ser 1.14; Hemoglobin 13.9; Platelets 456.0; Potassium 3.9; Sodium 137; TSH 0.78    Lipid Panel    Component Value Date/Time   CHOL 165 08/08/2021 0911   TRIG 86.0 08/08/2021 0911   HDL 53.70 08/08/2021 0911   CHOLHDL 3 08/08/2021 0911   VLDL 17.2 08/08/2021 0911   LDLCALC 94 08/08/2021 0911   LDLDIRECT 97.0 05/14/2018 0755      Wt Readings from Last 3 Encounters:  09/01/21 198 lb 9.6 oz (90.1 kg)  08/08/21 194 lb (88 kg)  07/06/21 198 lb 6.4 oz (90 kg)      Other studies Reviewed: Additional studies/ records that were reviewed today include: None. Review of the above records demonstrates:  Please see elsewhere in the note.     ASSESSMENT AND PLAN:  CAD:    The patient has no new sypmtoms.  No further cardiovascular testing is indicated.  We will continue with aggressive risk reduction and meds as listed.  HTN: His  blood pressure is at target.  No change in therapy.   DYSLIPIDEMIA: LDL was most recently 94 with an HDL of 53.  He is not at target and so I will stop his Lipitor and start Crestor 40 mg daily.  I will follow-up in the future with a repeat lipid profile.  SLEEP APNEA:    He  uses CPAP.  No change in therapy.  PAF:  Gabriel Aguilar has a CHA2DS2 - VASc score of 3.  He has had no symptomatic paroxysms.  He tolerates anticoagulation.  Please note that a diagnosis of DM was added in error to the problem list and was removed.    Current medicines are reviewed at length with the patient today.    The following changes have been made:   As above  Labs/ tests ordered today include: None  Orders Placed This Encounter  Procedures   Lipid panel   EKG 12-Lead     Disposition:   FU with me in 1 year   Signed, [name], MD  09/01/2021 4:43 PM    [place]

## 2021-09-01 ENCOUNTER — Encounter: Payer: Self-pay | Admitting: Cardiology

## 2021-09-01 ENCOUNTER — Ambulatory Visit: Payer: PPO | Admitting: Cardiology

## 2021-09-01 VITALS — BP 116/70 | HR 66 | Ht 68.0 in | Wt 198.6 lb

## 2021-09-01 DIAGNOSIS — E785 Hyperlipidemia, unspecified: Secondary | ICD-10-CM | POA: Diagnosis not present

## 2021-09-01 DIAGNOSIS — I10 Essential (primary) hypertension: Secondary | ICD-10-CM

## 2021-09-01 DIAGNOSIS — I2510 Atherosclerotic heart disease of native coronary artery without angina pectoris: Secondary | ICD-10-CM | POA: Diagnosis not present

## 2021-09-01 DIAGNOSIS — E118 Type 2 diabetes mellitus with unspecified complications: Secondary | ICD-10-CM | POA: Diagnosis not present

## 2021-09-01 MED ORDER — ROSUVASTATIN CALCIUM 40 MG PO TABS: 40.0000 mg | ORAL_TABLET | Freq: Every day | ORAL | 3 refills | Status: DC

## 2021-09-01 NOTE — Patient Instructions (Signed)
Medication Instructions:    Complete the bottle of Atrovastatin then stop   Then Start Rosuvastatin 40 mg one tablet at bedtime   *If you need a refill on your cardiac medications before your next appointment, please call your pharmacy*   Lab Work:  Lipid  6 months ( Nov 2023)  fasting If you have labs (blood work) drawn today and your tests are completely normal, you will receive your results only by: [place] (if you have MyChart) OR A paper copy in the mail If you have any lab test that is abnormal or we need to change your treatment, we will call you to review the results.   Testing/Procedures: Not needed   Follow-Up: At [facility], you and your health needs are our priority.  As part of our continuing mission to provide you with exceptional heart care, we have created designated Provider Care Teams.  These Care Teams include your primary Cardiologist (physician) and Advanced Practice Providers (APPs -  Physician Assistants and Nurse Practitioners) who all work together to provide you with the care you need, when you need it.   Your next appointment:   12 month(s)  The format for your next appointment:   In Person  Provider:   [name], MD      Important Information About Sugar

## 2021-09-14 DIAGNOSIS — G4733 Obstructive sleep apnea (adult) (pediatric): Secondary | ICD-10-CM | POA: Diagnosis not present

## 2021-10-12 ENCOUNTER — Telehealth: Payer: Self-pay | Admitting: Pharmacist

## 2021-10-12 NOTE — Chronic Care Management (AMB) (Signed)
Chronic Care Management Pharmacy Assistant   Name: Gabriel Aguilar  MRN: [id number] DOB: February 16, 1951  Reason for Encounter: Disease State   Conditions to be addressed/monitored: HTN  Recent office visits:  None   Recent consult visits:  09/01/21 Gabriel Breeding, MD (Cardiology) - Patient presented for Coronary Artery Disease involving native coronary artery of native heart without angina pectoris and other concerns. Prescribed Rosuvastatin Calcium 40 mg. Stopped Atorvastatin Calcium 80 mg. (To finish before starting Rosuvastatin)  Hospital visits:  None in previous 6 months  Medications: Outpatient Encounter Medications as of 10/12/2021  Medication Sig Note   amLODipine (NORVASC) 10 MG tablet TAKE ONE TABLET BY MOUTH ONCE DAILY    Ascorbic Acid (VITAMIN C) 1000 MG tablet Take 1,000 mg by mouth daily.    b complex vitamins capsule Take 1 capsule by mouth daily.    clopidogrel (PLAVIX) 75 MG tablet TAKE ONE TABLET BY MOUTH EVERY MORNING with breakfast    dicyclomine (BENTYL) 20 MG tablet Take 1 tablet (20 mg total) by mouth 2 (two) times daily.    fenofibrate 54 MG tablet TAKE ONE TABLET BY MOUTH ONCE DAILY    hydrochlorothiazide (HYDRODIURIL) 25 MG tablet TAKE ONE TABLET BY MOUTH ONCE DAILY    LORazepam (ATIVAN) 0.5 MG tablet Take 1 tablet (0.5 mg total) by mouth at bedtime.    metoprolol tartrate (LOPRESSOR) 25 MG tablet Take 0.5 tablets (12.5 mg total) by mouth every 8 (eight) hours as needed (ELEVATED HEART RATE). 03/23/2021: prn   niacin 500 MG tablet Take 500 mg by mouth at bedtime.    nitroGLYCERIN (NITROSTAT) 0.4 MG SL tablet Place 1 tablet (0.4 mg total) under the tongue every 5 (five) minutes as needed for chest pain. 03/23/2021: Has on hand   omeprazole (PRILOSEC) 40 MG capsule TAKE ONE CAPSULE BY MOUTH ONCE DAILY    rosuvastatin (CRESTOR) 40 MG tablet Take 1 tablet (40 mg total) by mouth daily.    triamcinolone cream (KENALOG) 0.1 % Apply topically 2 (two) times daily.     XARELTO 20 MG TABS tablet TAKE ONE TABLET BY MOUTH ONCE DAILY WITH SUPPER 08/26/2020: Last dose 08/23/20 for colonoscopy   No facility-administered encounter medications on file as of 10/12/2021.   Reviewed chart prior to disease state call. Spoke with patient regarding BP  Recent Office Vitals: BP Readings from Last 3 Encounters:  09/01/21 116/70  08/08/21 110/68  07/06/21 130/64   Pulse Readings from Last 3 Encounters:  09/01/21 66  08/08/21 61  07/06/21 92    Wt Readings from Last 3 Encounters:  09/01/21 198 lb 9.6 oz (90.1 kg)  08/08/21 194 lb (88 kg)  07/06/21 198 lb 6.4 oz (90 kg)     Kidney Function Lab Results  Component Value Date/Time   CREATININE 1.14 08/08/2021 09:11 AM   CREATININE 1.07 03/23/2021 11:46 AM   CREATININE 1.18 02/10/2016 01:13 PM   GFR 64.16 08/08/2021 09:11 AM   GFRNONAA >60 03/23/2021 11:46 AM   GFRAA >60 05/30/2018 01:47 AM       Latest Ref Rng & Units 08/08/2021    9:11 AM 03/23/2021   11:46 AM 08/04/2020    9:14 AM  BMP  Glucose 70 - 99 mg/dL 101  96  96   BUN 6 - 23 mg/dL '20  17  25   '$ Creatinine 0.40 - 1.50 mg/dL 1.14  1.07  1.13   Sodium 135 - 145 mEq/L 137  136  136   Potassium 3.5 - 5.1  mEq/L 3.9  3.6  3.5   Chloride 96 - 112 mEq/L 97  99  96   CO2 19 - 32 mEq/L '30  28  30   '$ Calcium 8.4 - 10.5 mg/dL 9.8  10.0  9.9     Current antihypertensive regimen:  Hydrochlorothiazide '25mg'$ , 1 tablet once daily  Amlodipine '10mg'$ , 1 tablet once daily How often are you checking your Blood Pressure? when feeling symptomatic he denies any hyper/hypotensive symptoms Current home BP readings:  BP Readings from Last 3 Encounters:  09/01/21 116/70  08/08/21 110/68  07/06/21 130/64   What recent interventions/DTPs have been made by any provider to improve Blood Pressure control since last CPP Visit: Patient reports none Any recent hospitalizations or ED visits since last visit with CPP? No What exercise is being done to improve your Blood Pressure  Control?  Patient reports he has been keeping up with his walking did this morning for about 30 min.  Adherence Review: Is the patient currently on ACE/ARB medication? No Does the patient have >5 day gap between last estimated fill dates? No  Notes: Patient reports he does not check blood pressures usually, inquired on whether or not he has begun the Rosuvastatin he reports he has not yet, still has a bit of Atorvastatin left plans to finish it first.   Care Gaps: Foot Exam - Overdue Eye Exam - Overdue Urine Micro - Overdue Zoster Vaccine - Overdue COVID Booster - Postponed CCM - 10/23 BP- 116/70 5/23 Lab Results  Component Value Date   HGBA1C 5.7 08/08/2021    Star Rating Drugs: Atorvastatin 80 mg - Last filled 08/25/21 90 DS at [facility] Rosuvastatin 40 mg - Patient decided to finish above fill before starting   [place] Pharmacist Assistant [phone]

## 2021-11-07 ENCOUNTER — Telehealth: Payer: Self-pay | Admitting: Family Medicine

## 2021-11-07 NOTE — Telephone Encounter (Signed)
No he should not stop the blood thinner. Try Afrin nasal spray and packing the nostril with gauze. If that does not work, he should go to an urgent care

## 2021-11-07 NOTE — Telephone Encounter (Signed)
Noted  

## 2021-11-07 NOTE — Telephone Encounter (Signed)
Lvm for patient to return call to office.    Patient currently active on My Chart, will sent my chart message.

## 2021-11-07 NOTE — Telephone Encounter (Signed)
Pt called to inform MD that he scratched the inside of his nose on Thursday and has been bleeding profusely ever since.  Pt is on blood thinners and would like to know if he can stop taking them just until the bleeding stops. Pt is currently in [place].  Please advise. [phone]

## 2021-11-07 NOTE — Telephone Encounter (Signed)
Pt called in and states he is already taking Afrin and voiced understanding to go to urgent care if symptoms persist

## 2021-11-09 ENCOUNTER — Other Ambulatory Visit: Payer: PPO

## 2021-11-14 ENCOUNTER — Telehealth: Payer: Self-pay | Admitting: Family Medicine

## 2021-11-14 NOTE — Telephone Encounter (Signed)
Pt called in stating he was returning a call from someone on the care team.

## 2021-11-15 NOTE — Telephone Encounter (Signed)
No current message in med record.   Possible appointment reminder calls upcoming appt 11/16/21

## 2021-11-16 ENCOUNTER — Other Ambulatory Visit: Payer: PPO

## 2021-11-16 ENCOUNTER — Ambulatory Visit (INDEPENDENT_AMBULATORY_CARE_PROVIDER_SITE_OTHER): Payer: PPO | Admitting: Family Medicine

## 2021-11-16 ENCOUNTER — Encounter: Payer: Self-pay | Admitting: Family Medicine

## 2021-11-16 VITALS — BP 124/70 | HR 60 | Temp 98.1°F | Wt 197.0 lb

## 2021-11-16 DIAGNOSIS — R972 Elevated prostate specific antigen [PSA]: Secondary | ICD-10-CM | POA: Diagnosis not present

## 2021-11-16 DIAGNOSIS — E785 Hyperlipidemia, unspecified: Secondary | ICD-10-CM | POA: Diagnosis not present

## 2021-11-16 DIAGNOSIS — R040 Epistaxis: Secondary | ICD-10-CM

## 2021-11-16 LAB — PSA: PSA: 3.38 ng/mL (ref 0.10–4.00)

## 2021-11-16 LAB — LIPID PANEL
Cholesterol: 183 mg/dL (ref 0–200)
HDL: 51.7 mg/dL (ref >39.00)
LDL Cholesterol: 101 mg/dL — ABNORMAL HIGH (ref 0–99)
NonHDL: 131.19
Total CHOL/HDL Ratio: 4
Triglycerides: 149 mg/dL (ref 0.0–149.0)
VLDL: 29.8 mg/dL (ref 0.0–40.0)

## 2021-11-16 NOTE — Addendum Note (Signed)
Addended by: Wyvonne Lenz on: 11/16/2021 09:46 AM   Modules accepted: Orders

## 2021-11-16 NOTE — Progress Notes (Signed)
   Subjective:    Patient ID: Gabriel Aguilar, male    DOB: 09/24/1950, 71 y.o.   MRN: [id number]  HPI Here for a left sided nosebleed that started 2 weeks ago. No hx of trauma but the bleeding began 2 weeks ago. He has tried to be careful about blowing the nose, and he has tried using Afrin spray. Each time he gets it stopped, it starts bleeding again. There is no soreness. He does take Xarelto.    Review of Systems  Constitutional: Negative.   HENT:  Positive for nosebleeds.   Eyes: Negative.   Respiratory: Negative.    Cardiovascular: Negative.   Hematological:  Does not bruise/bleed easily.       Objective:   Physical Exam Constitutional:      Appearance: Normal appearance.  HENT:     Nose:     Comments: The left nostril is red and inflamed, and there is a large clot sitting on the septal area  Cardiovascular:     Rate and Rhythm: Normal rate and regular rhythm.     Pulses: Normal pulses.     Heart sounds: Normal heart sounds.  Pulmonary:     Effort: Pulmonary effort is normal.     Breath sounds: Normal breath sounds.  Neurological:     Mental Status: He is alert.           Assessment & Plan:  Epistaxis. Refer to ENT for a likely cautery procedure.  Alysia Penna, MD

## 2021-11-17 ENCOUNTER — Other Ambulatory Visit: Payer: Self-pay | Admitting: Cardiology

## 2021-11-18 ENCOUNTER — Telehealth: Payer: Self-pay | Admitting: Pharmacist

## 2021-11-18 NOTE — Chronic Care Management (AMB) (Signed)
Chronic Care Management Pharmacy Assistant   Name: Gabriel Aguilar  MRN: [id number] DOB: [birth year]  Reason for Encounter: Medication Review Medication Coordination   Recent office visits:  11/16/21 Laurey Morale, MD - Patient presented for Epistaxis and other concerns. No medication changes.  Recent consult visits:  None  Hospital visits:  None in previous 6 months  Medications: Outpatient Encounter Medications as of 11/18/2021  Medication Sig Note   amLODipine (NORVASC) 10 MG tablet TAKE ONE TABLET BY MOUTH ONCE DAILY    Ascorbic Acid (VITAMIN C) 1000 MG tablet Take 1,000 mg by mouth daily.    b complex vitamins capsule Take 1 capsule by mouth daily.    clopidogrel (PLAVIX) 75 MG tablet TAKE ONE TABLET BY MOUTH EVERY MORNING with breakfast    dicyclomine (BENTYL) 20 MG tablet Take 1 tablet (20 mg total) by mouth 2 (two) times daily.    fenofibrate 54 MG tablet TAKE ONE TABLET BY MOUTH ONCE DAILY    hydrochlorothiazide (HYDRODIURIL) 25 MG tablet TAKE ONE TABLET BY MOUTH ONCE DAILY    LORazepam (ATIVAN) 0.5 MG tablet Take 1 tablet (0.5 mg total) by mouth at bedtime.    metoprolol tartrate (LOPRESSOR) 25 MG tablet Take 0.5 tablets (12.5 mg total) by mouth every 8 (eight) hours as needed (ELEVATED HEART RATE). 03/23/2021: prn   niacin 500 MG tablet Take 500 mg by mouth at bedtime.    nitroGLYCERIN (NITROSTAT) 0.4 MG SL tablet Place 1 tablet (0.4 mg total) under the tongue every 5 (five) minutes as needed for chest pain. 03/23/2021: Has on hand   omeprazole (PRILOSEC) 40 MG capsule TAKE ONE CAPSULE BY MOUTH ONCE DAILY    rosuvastatin (CRESTOR) 40 MG tablet Take 1 tablet (40 mg total) by mouth daily.    triamcinolone cream (KENALOG) 0.1 % Apply topically 2 (two) times daily.    XARELTO 20 MG TABS tablet TAKE ONE TABLET BY MOUTH ONCE DAILY WITH SUPPER 08/26/2020: Last dose 08/23/20 for colonoscopy   No facility-administered encounter medications on file as of 11/18/2021.   Reviewed chart  for medication changes ahead of medication coordination call.  BP Readings from Last 3 Encounters:  11/16/21 124/70  09/01/21 116/70  08/08/21 110/68    Lab Results  Component Value Date   HGBA1C 5.7 08/08/2021     Patient obtains medications through Adherence Packaging  90 Days   Last adherence delivery included:  Fenofibrate 54 mg: one tab once a day Clopidogrel 75 mg: one tab once a day Amlodipine 10 mg: one tab once daily HCTZ 25 mg : one tab once a day Atorvastatin 80 mg: one tab once a day Omeprazole 40 mg: one capsule daily    Confirmed delivery date of 09/01/21,   advised patient that pharmacy will contact him the morning of delivery.     Patient is due for next adherence delivery on: 11/30/21. Called patient and reviewed medications and coordinated delivery. Packs 90 DS  This delivery to include: Fenofibrate 54 mg: one tab once a day HCTZ 25 mg : one tab once a day Amlodipine 10 mg: one tab once daily Clopidogrel 75 mg: one tab once a day Omeprazole 40 mg: one capsule daily Rosuvastatin 40 mg: one tab once a day  Confirmed delivery date of 11/30/21, advised patient that pharmacy will contact them the morning of delivery.   Care Gaps: Foot Exam - Overdue Eye Exam - Overdue Urine  Micro - Overdue Zoster Vaccine - Overdue Flu Vaccine - Overdue COVID  Booster - Postponed CCM- 10/23 BP- 124/70 11/16/21 AWV- 1/23 Lab Results  Component Value Date   HGBA1C 5.7 08/08/2021    Star Rating Drugs: Rosuvastatin 40 mg - Last filled 11/23/21 90 DS at [facility] Pharmacist Assistant [phone]

## 2021-11-24 ENCOUNTER — Telehealth: Payer: Self-pay

## 2021-11-24 NOTE — Telephone Encounter (Signed)
Caller states he had a referral and is wanting to hear back from them. He is having nose bleeds. Was referring to [facility], was seen last week and referral was to be sent over. States he needs something cauterized. Had another nose bleed yesterday, takes approx 10-15 mins to get it stopped. States he takes blood thinners due to Stents and AFib. Will be out of town next week. Advised caller if he had not heard response by tomorrow at lunch to call back to see if there is an authorization process or insurance detail that is being taken care of before he receives the call. v/u

## 2021-11-25 NOTE — Telephone Encounter (Signed)
FYI

## 2022-01-13 ENCOUNTER — Telehealth: Payer: Self-pay | Admitting: Pharmacist

## 2022-01-13 NOTE — Progress Notes (Signed)
Chronic Care Management Pharmacy Note  01/16/2022 Name:  Gabriel Aguilar MRN:  [id number] DOB:  [birth year]  Summary: BP mostly at goal < 130/80 per home readings LDL not at goal < 70   Recommendations/Changes made from today's visit: -Recommend rechecking lipid panel in 2-3 months and consider starting Zetia if LDL > 70 -Recommended immodium PRN for diarrhea -Recommended updating problem list to remove diabetes and sent message to cardiologist to address   Plan: PCP visit for tomorrow for abdominal pain and diarrhea Follow up BP assessment in 3 months Follow up in 6 months  Subjective: Gabriel Aguilar is an 71 y.o. year old male who is a primary patient of Laurey Morale, MD.  The CCM team was consulted for assistance with disease management and care coordination needs.    Engaged with patient by telephone for follow up visit in response to provider referral for pharmacy case management and/or care coordination services.   Consent to Services:  The patient was given information about Chronic Care Management services, agreed to services, and gave verbal consent prior to initiation of services.  Please see initial visit note for detailed documentation.   Patient Care Team: Laurey Morale, MD as PCP - General (Family Medicine) [name], MD as PCP - Cardiology (Cardiology) Thompson Grayer, MD as PCP - Electrophysiology (Cardiology) Rigoberto Noel, MD as Consulting Physician (Pulmonary Disease) Magrinat, Virgie Dad, MD (Inactive) as Consulting Physician (Oncology) Viona Gilmore, [facility] as Pharmacist (Pharmacist)  Recent office visits: 11/16/21 Laurey Morale, MD - Patient presented for Epistaxis and other concerns. No medication changes.  08/08/21 Laurey Morale, MD - Patient presented for Preventative health care and other concerns. Prescribed Lorazepam.  Recent consult visits: 09/01/21 [name], MD (Cardiology) - Patient presented for Coronary Artery Disease involving native  coronary artery of native heart without angina pectoris and other concerns. Prescribed Rosuvastatin Calcium 40 mg. Stopped Atorvastatin Calcium 80 mg. (To finish before starting Rosuvastatin)  07/06/21 Kara Mead, MD (pulmonology): Patient presented for OSA follow up. Prescribed CPAP supplies. Follow up in 1 year.  05/24/21 Gloris Manchester, PA (dermatology): Patient presented for follow up. Unable to access notes.   Hospital visits: None in previous 6 months  Objective:  Lab Results  Component Value Date   CREATININE 1.14 08/08/2021   BUN 20 08/08/2021   GFR 64.16 08/08/2021   GFRNONAA >60 03/23/2021   GFRAA >60 05/30/2018   NA 137 08/08/2021   K 3.9 08/08/2021   CALCIUM 9.8 08/08/2021   CO2 30 08/08/2021   GLUCOSE 101 (H) 08/08/2021    Lab Results  Component Value Date/Time   HGBA1C 5.7 08/08/2021 09:11 AM   HGBA1C 5.9 08/04/2020 09:14 AM   GFR 64.16 08/08/2021 09:11 AM   GFR 65.30 08/04/2020 09:14 AM    Last diabetic Eye exam: No results found for: "HMDIABEYEEXA"  Last diabetic Foot exam: No results found for: "HMDIABFOOTEX"   Lab Results  Component Value Date   CHOL 183 11/16/2021   HDL 51.70 11/16/2021   LDLCALC 101 (H) 11/16/2021   LDLDIRECT 97.0 05/14/2018   TRIG 149.0 11/16/2021   CHOLHDL 4 11/16/2021       Latest Ref Rng & Units 08/08/2021    9:11 AM 03/23/2021   11:46 AM 08/04/2020    9:14 AM  Hepatic Function  Total Protein 6.0 - 8.3 g/dL 7.4  7.5  7.1   Albumin 3.5 - 5.2 g/dL 4.6  4.5  4.5   AST 0 - 37  U/L 60  27  42   ALT 0 - 53 U/L 41  30  43   Alk Phosphatase 39 - 117 U/L 66  49  56   Total Bilirubin 0.2 - 1.2 mg/dL 0.7  0.9  0.7   Bilirubin, Direct 0.0 - 0.3 mg/dL 0.2   0.1     Lab Results  Component Value Date/Time   TSH 0.78 08/08/2021 09:11 AM   TSH 0.69 08/04/2020 09:14 AM   FREET4 0.77 08/04/2020 09:14 AM       Latest Ref Rng & Units 08/08/2021    9:11 AM 03/23/2021   11:46 AM 08/04/2020    9:14 AM  CBC  WBC 4.0 - 10.5 K/uL 12.0   11.1  11.2   Hemoglobin 13.0 - 17.0 g/dL 13.9  14.1  13.7   Hematocrit 39.0 - 52.0 % 41.8  41.5  38.9   Platelets 150.0 - 400.0 K/uL 456.0  317  398.0     No results found for: "VD25OH"  Clinical ASCVD: Yes  The ASCVD Risk score (Arnett DK, et al., 2019) failed to calculate for the following reasons:   The patient has a prior MI or stroke diagnosis       11/16/2021    8:34 AM 08/08/2021    8:32 AM 05/04/2021    3:24 PM  Depression screen PHQ 2/9  Decreased Interest 0 0 0  Down, Depressed, Hopeless 0 0 0  PHQ - 2 Score 0 0 0  Altered sleeping 1 2   Tired, decreased energy 0 2   Change in appetite 0 0   Feeling bad or failure about yourself  0 0   Trouble concentrating 0 0   Moving slowly or fidgety/restless 0 0   Suicidal thoughts 0 0   PHQ-9 Score 1 4   Difficult doing work/chores Not difficult at all Not difficult at all      CHA2DS2/VAS Stroke Risk Points  Current as of 4 hours ago     3 >= 2 Points: High Risk  1 - 1.99 Points: Medium Risk  0 Points: Low Risk    Last Change: N/A      Details    This score determines the patient's risk of having a stroke if the  patient has atrial fibrillation.       Points Metrics  0 Has Congestive Heart Failure:  No    Current as of 4 hours ago  1 Has Vascular Disease:  Yes    Current as of 4 hours ago  1 Has Hypertension:  Yes    Current as of 4 hours ago  1 Age:  56    Current as of 4 hours ago  0 Has Diabetes:  No    Current as of 4 hours ago  0 Had Stroke:  No  Had TIA:  No  Had Thromboembolism:  No    Current as of 4 hours ago  0 Male:  No    Current as of 4 hours ago       Social History   Tobacco Use  Smoking Status Former   Packs/day: 1.50   Types: Cigarettes   Start date: 04/10/1964   Quit date: 03/06/1983   Years since quitting: 38.8  Smokeless Tobacco Never   BP Readings from Last 3 Encounters:  11/16/21 124/70  09/01/21 116/70  08/08/21 110/68   Pulse Readings from Last 3 Encounters:  11/16/21 60   09/01/21 66  08/08/21 61  Wt Readings from Last 3 Encounters:  11/16/21 197 lb (89.4 kg)  09/01/21 198 lb 9.6 oz (90.1 kg)  08/08/21 194 lb (88 kg)   BMI Readings from Last 3 Encounters:  11/16/21 29.95 kg/m  09/01/21 30.20 kg/m  08/08/21 29.50 kg/m    Assessment/Interventions: Review of patient past medical history, allergies, medications, health status, including review of consultants reports, laboratory and other test data, was performed as part of comprehensive evaluation and provision of chronic care management services.   SDOH:  (Social Determinants of Health) assessments and interventions performed: Yes SDOH Interventions    Flowsheet Row Chronic Care Management from 01/16/2022 in [place] at [place] from 03/17/2020 in [place] at [place] Management from 08/14/2019 in [place] at [place] from 05/13/2018 in [place] at [place] Interventions -- Intervention Not Indicated -- --  Housing Interventions -- Intervention Not Indicated -- --  Transportation Interventions -- Intervention Not Indicated -- --  Depression Interventions/Treatment  -- PHQ2-9 Score <4 Follow-up Not Indicated -- PHQ2-9 Score <4 Follow-up Not Indicated  Financial Strain Interventions Intervention Not Indicated Intervention Not Indicated Other (Comment)  [Not needed] --  Physical Activity Interventions -- Intervention Not Indicated -- --  Stress Interventions -- Intervention Not Indicated -- --  Social Connections Interventions -- Intervention Not Indicated -- --      SDOH Screenings   Food Insecurity: No Food Insecurity (05/04/2021)  Housing: Low Risk  (05/04/2021)  Transportation Needs: No Transportation Needs (05/04/2021)  Alcohol Screen: Low Risk  (03/17/2020)  Depression (PHQ2-9): Low Risk  (11/16/2021)  Financial Resource Strain: Low Risk  (01/16/2022)  Physical  Activity: Sufficiently Active (05/04/2021)  Social Connections: Socially Integrated (05/04/2021)  Stress: No Stress Concern Present (05/04/2021)  Tobacco Use: Medium Risk (11/16/2021)    [place]  No Known Allergies  Medications Reviewed Today     Reviewed by Viona Gilmore, [facility] (Pharmacist) on 01/16/22 at 0855  Med List Status: <None>   Medication Order Taking? Sig Documenting Provider Last Dose Status Informant  amLODipine (NORVASC) 10 MG tablet [id number] Yes TAKE ONE TABLET BY MOUTH ONCE DAILY [name], MD Taking Active   Ascorbic Acid (VITAMIN C) 1000 MG tablet [id number] Yes Take 1,000 mg by mouth daily. [provider] Taking Active Self  b complex vitamins capsule [id number] Yes Take 1 capsule by mouth daily. [provider] Taking Active Self  Black Pepper-Turmeric (TURMERIC CURCUMIN) 08-998 MG CAPS [id number] Yes Take 1,000 mg by mouth daily. [provider] Taking Active   Cholecalciferol (VITAMIN D) 50 MCG (2000 UT) CAPS [id number] Yes Take 1 capsule by mouth daily. [provider] Taking Active   clopidogrel (PLAVIX) 75 MG tablet [id number] Yes TAKE ONE TABLET BY MOUTH EVERY MORNING with breakfast [name], MD Taking Active   dicyclomine (BENTYL) 20 MG tablet [id number] No Take 1 tablet (20 mg total) by mouth 2 (two) times daily.  Patient not taking: Reported on 01/16/2022   Sherrill Raring, [place] Not Taking Active   fenofibrate 54 MG tablet [id number] Yes TAKE ONE TABLET BY MOUTH ONCE DAILY [name], MD Taking Active   hydrochlorothiazide (HYDRODIURIL) 25 MG tablet [id number] Yes TAKE ONE TABLET BY MOUTH ONCE DAILY [name], MD Taking Active   LORazepam (ATIVAN) 0.5 MG tablet [id number] No Take 1 tablet (0.5 mg total) by mouth at bedtime.  Patient not taking: Reported on 01/16/2022   Laurey Morale, MD  Not Taking Active   magnesium oxide (MAG-OX) 400 (240 Mg) MG tablet [id number] Yes Take 400 mg by mouth daily. [provider] Taking Active   metoprolol tartrate (LOPRESSOR) 25 MG tablet [id number] Yes Take 0.5 tablets (12.5 mg total) by mouth every 8 (eight) hours as needed (ELEVATED HEART RATE). [name], MD Taking Active Self           Med Note Eino Farber   Wed Mar 23, 2021  1:17 PM) prn  niacin 500 MG tablet [id number] Yes Take 500 mg by mouth at bedtime. [provider] Taking Active Self  nitroGLYCERIN (NITROSTAT) 0.4 MG SL tablet [id number] No Place 1 tablet (0.4 mg total) under the tongue every 5 (five) minutes as needed for chest pain.  Patient not taking: Reported on 01/16/2022   [name], MD Not Taking Active Self           Med Note Eino Farber   Wed Mar 23, 2021  1:17 PM) Has on hand  omeprazole (PRILOSEC) 40 MG capsule [id number] Yes TAKE ONE CAPSULE BY MOUTH ONCE DAILY [name], MD Taking Active   polyethylene glycol powder (GLYCOLAX/MIRALAX) 17 GM/SCOOP powder [id number] Yes Take 1 Container by mouth as needed for moderate constipation. [provider] Taking Active   rosuvastatin (CRESTOR) 40 MG tablet [id number] Yes Take 1 tablet (40 mg total) by mouth daily. [name], MD Taking Active   triamcinolone cream (KENALOG) 0.1 % [id number] No Apply topically 2 (two) times daily.  Patient not taking: Reported on 01/16/2022   [provider] Not Taking Active   XARELTO 20 MG TABS tablet [id number] Yes TAKE ONE TABLET BY MOUTH ONCE DAILY WITH SUPPER [name], MD Taking Active Self           Med Note Esther Hardy, JESSICA L   Thu Aug 26, 2020  3:35 PM) Last dose 08/23/20 for colonoscopy  zinc gluconate 50 MG tablet [id number] Yes Take 50 mg by mouth daily. 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/14/2019   Antiplatelet or antithrombotic long-term use 09/16/2018   Coronary artery disease involving native coronary artery of native heart without angina pectoris 09/16/2018   Dyslipidemia 09/16/2018   Essential hypertension 09/16/2018   Educated about COVID-19 virus infection 09/16/2018   Ankle edema, bilateral 07/17/2018   Status post coronary artery stent placement    NSTEMI (non-ST elevated myocardial infarction) ([place])    Elevated troponin 05/28/2018   Prostatitis 07/05/2015   BPH (benign prostatic hyperplasia) 07/01/2015   Dysuria 07/01/2015   PAF (paroxysmal atrial fibrillation) ([place])    Acute pericarditis    Second degree AV block    Chest pain 05/11/2015   GERD (gastroesophageal reflux disease) 05/11/2015   Persistent lymphocytosis 05/11/2015   Single kidney 05/11/2015   Hyperlipidemia 05/11/2015   Other and unspecified hyperlipidemia 06/02/2013   OSA (obstructive sleep apnea) 03/05/2013    Immunization History  Administered Date(s) Administered   Fluad Quad(high Dose 65+) 03/17/2020   Influenza Split 02/22/2011   PFIZER(Purple Top)SARS-COV-2 Vaccination 11/05/2019, 12/05/2019, 07/31/2020   Pneumococcal Conjugate-13 05/13/2018   Pneumococcal Polysaccharide-23 08/04/2020  Tdap 08/04/2020   Patient reports he just lost his brother to liver cancer. He lives in [place] and traveled to see him and was down there the last few week. He was traveling with his CPAP machine and he thinks it got some sort of bacteria in it because now he has been coughing, has a headache, some weakness and wasn't able to check a temperature.  Patient reports he is also concerned because his bowels are not regular. This has been going on for a few months. He is having more diarrhea and not constipation. He reports sometimes it's a couple times a day. When he  was traveling, his sister was eating a lot less food and he was eating healthier with her so he doesn't think this is coming from his diet.  He is having a noticeable discomfort in upper stomach on right hand side. It is not a sharp pain and it's a discomfort. He isn't sure when it happens and sometimes after eating.  Patient is no longer bleeding in his nose. He is going to ENT this month for the first time. He is hoping this will also help with his breathing at night time and when he sits up he is breathing fine. He is using Afrin because of the nose bleed and is aware this can cause rebound congestion and is trying to taper off.  Patient reports he just now started on the rosuvastatin this week. He hasn't noticed any side effects yet and set up a lab visit to have this rechecked in November.   Conditions to be addressed/monitored:  Hypertension, Hyperlipidemia, Atrial Fibrillation, Coronary Artery Disease and GERD  Conditions addressed this visit: Hypertension, Hyperlipidemia  Care Plan : [place]  Updates made by Viona Gilmore, [place] since 01/16/2022 12:00 AM     Problem: Problem:  HTN, HLD, Paroxysmal Afib, CAD/NSTEMI, GERD      Long-Range Goal: Patient-Specific Goal   Start Date: 07/26/2020  Expected End Date: 07/26/2021  Recent Progress: On track  Priority: High  Note:   Current Barriers:  Unable to independently monitor therapeutic efficacy Unable to achieve control of cholesterol   Pharmacist Clinical Goal(s):  Patient will achieve adherence to monitoring guidelines and medication adherence to achieve therapeutic efficacy achieve control of cholesterol as evidenced by next lipid panel  through collaboration with PharmD and provider.   Interventions: 1:1 collaboration with Laurey Morale, MD regarding development and update of comprehensive plan of care as evidenced by provider attestation and co-signature Inter-disciplinary care team collaboration (see  longitudinal plan of care) Comprehensive medication review performed; medication list updated in electronic medical record  Hypertension (BP goal <130/80) -Controlled (per home readings) -Current treatment: Hydrochlorothiazide 16m, 1 tablet once daily - Appropriate, Effective, Safe, Accessible Amlodipine 173m 1 tablet once daily - Appropriate, Effective, Safe, Accessible -Medications previously tried: none  -Current home readings: 142/70 HR 71 (usually not this high) -Current dietary habits: tries to limit salt intake -Current exercise habits: just started back at the gym this week -Denies hypotensive/hypertensive symptoms -Educated on Exercise goal of 150 minutes per week; Importance of home blood pressure monitoring; Proper BP monitoring technique; -Counseled to monitor BP at home weekly, document, and provide log at future appointments -Counseled on diet and exercise extensively Recommended to continue current medication Recommended checking BP at least minimally when feeling dizzy/lightheaded.  Hyperlipidemia: (LDL goal < 70) -Uncontrolled -Current treatment: Rosuvastatin 4094m1 tablet once daily at 6 pm - Appropriate, Query effective, Safe, Accessible Fenofibrate  48m, 1 tablet once daily - Query Appropriate , Effective, Safe, Accessible -Medications previously tried: fish oil (patient stopped on his own) -Current dietary patterns: does a lot of baking and grilling; oatmeal for breakfast; has been eating out more and more red meat than usual -Current exercise habits: goes to the gym every day and uses the treadmill some days and weights most days -Educated on Cholesterol goals;  Importance of limiting foods high in cholesterol; Exercise goal of 150 minutes per week; -Counseled on diet and exercise extensively Recommended to continue current medication Recommended follow up with cardiology to recheck cholesterol  Atrial Fibrillation (Goal: prevent stroke and major  bleeding) -Controlled -CHADSVASC: 3 -Current treatment: Rate control: metoprolol as needed - Appropriate, Effective, Safe, Accessible Anticoagulation: Xarelto 246m1 tablet once daily - Appropriate, Effective, Safe, Accessible -Medications previously tried: 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) -Home BP and HR readings: does not check regularly  -Counseled on bleeding risk associated with Xarelto and importance of self-monitoring for signs/symptoms of bleeding; avoidance of NSAIDs due to increased bleeding risk with anticoagulants; -Recommended to continue current medication Educated on triggers of Afib including chocolate, alcohol and caffeine.  CAD/history of MI (Goal: prevent future heart attacks and strokes) -Controlled -Current treatment  Clopidogrel 7573m1 tablet once daily - Appropriate, Effective, Safe, Accessible Nitroglycerin 0.4mg18m tablet under tongue as needed for chest pain - Appropriate, Effective, Safe, Accessible -Medications previously tried: none  -Recommended to continue current medication  GERD (Goal: minimize symptoms of acid reflux/heartburn) -Controlled -Current treatment  Omeprazole 40mg63mcapsule once daily - Appropriate, Effective, Safe, Accessible -Medications previously tried: none  -Counseled on non-pharmacological interventions for acid reflux. Take measures to prevent acid reflux, such as avoiding spicy foods, avoiding caffeine, avoid laying down a few hours after eating, and raising the head of the bed.  Allergic rhinitis (Goal: minimize symptoms of allergies) -Uncontrolled -Current treatment  Afrin only as needed (works through the night) - Appropriate, Effective, Query Safe, Accessible Fluticasone 50mcg20m nasal spray, 1 spray into both nostrils at bedtime - not taking - Appropriate, Effective, Safe, Accessible Claritin 10 mg 1 tablet at night - Appropriate, Effective, Safe, Accessible -Medications previously tried: ipratropium (ineffective)   -Counseled on limiting use of Afrin to 3 consecutive days due to rebound congestion and increased BP Recommended trial of Flonase to prevent overuse of Afrin and can consider switching to alternative antihistamine such as Zyrtec or Allegra if Claritin is not helping   Health Maintenance -Vaccine gaps: shingles -Current therapy:  Ascorbic acid (Vitamin C) 1000mg, 74mblet once daily (recommended to drink plenty water with it)  Vitamin B complex daily [place]ider support prostate health daily Niacin 500 mg daily (for skin) Vitamin D 2000 units daily Turmeric 1000 mg daily Zinc 50 mg daily -Educated on Cost vs benefit of each product must be carefully weighed by individual consumer -Patient is satisfied with current therapy and denies issues -Recommended to continue current medication  Patient Goals/Self-Care Activities Patient will:  - take medications as prescribed check blood pressure weekly, document, and provide at future appointments target a minimum of 150 minutes of moderate intensity exercise weekly  Follow Up Plan: Telephone follow up appointment with care management team member scheduled for: 6 months       Medication Assistance: None required.  Patient affirms current coverage meets needs.  Compliance/Adherence/Medication fill history: Care Gaps: COVID booster, shingrix, influenza (doesn't usually do that one) BP- 124/70 (11/16/21)   Star-Rating Drugs: Rosuvastatin 40 mg - Last filled 12/02/21  90 DS at [facility]  Patient's preferred pharmacy is:  [facility] - [place], [place] - [address] [place] [address] Phone: [phone] Fax: [phone]  [id number]: [phone]: [phone]  Uses pill box? Yes Pt endorses 100% compliance  We discussed: Benefits of medication synchronization, packaging  and delivery as well as enhanced pharmacist oversight with [facility]. Patient decided to: Utilize [facility] pharmacy for medication synchronization, packaging and delivery  Care Plan and Follow Up Patient Decision:  Patient agrees to Care Plan and Follow-up.  Plan: Telephone follow up appointment with care management team member scheduled for:  6 months  MJeni Salles PharmD BWinstonvillePharmacist [place]at [phone]

## 2022-01-13 NOTE — Chronic Care Management (AMB) (Signed)
    Chronic Care Management Pharmacy Assistant   Name: Gabriel Aguilar  MRN: [id number] DOB: [birth year] 01/13/22 APPOINTMENT REMINDER   Called Patient No answer, left message of appointment on 01/16/22 at 8:30 via telephone visit with Jeni Salles, Pharm D.   Notified to have all medications, supplements, blood pressure and/or blood sugar logs available during appointment and to return call if need to reschedule.     Care Gaps: Foot Exam - Overdue Eye Exam - Overdue Diabetic Urine - Overdue Zoster Vaccine - Overdue Flu Vaccine - Overdue COVID Booster - Postponed BP- 124/70 8/23 AWV-1/23 Lab Results  Component Value Date   HGBA1C 5.7 08/08/2021    Star Rating Drug: Rosuvastatin 40 mg - Last filled 11/23/21 90 DS at [facility]     Medications: Outpatient Encounter Medications as of 01/13/2022  Medication Sig Note   amLODipine (NORVASC) 10 MG tablet TAKE ONE TABLET BY MOUTH ONCE DAILY    Ascorbic Acid (VITAMIN C) 1000 MG tablet Take 1,000 mg by mouth daily.    b complex vitamins capsule Take 1 capsule by mouth daily.    clopidogrel (PLAVIX) 75 MG tablet TAKE ONE TABLET BY MOUTH EVERY MORNING with breakfast    dicyclomine (BENTYL) 20 MG tablet Take 1 tablet (20 mg total) by mouth 2 (two) times daily.    fenofibrate 54 MG tablet TAKE ONE TABLET BY MOUTH ONCE DAILY    hydrochlorothiazide (HYDRODIURIL) 25 MG tablet TAKE ONE TABLET BY MOUTH ONCE DAILY    LORazepam (ATIVAN) 0.5 MG tablet Take 1 tablet (0.5 mg total) by mouth at bedtime.    metoprolol tartrate (LOPRESSOR) 25 MG tablet Take 0.5 tablets (12.5 mg total) by mouth every 8 (eight) hours as needed (ELEVATED HEART RATE). 03/23/2021: prn   niacin 500 MG tablet Take 500 mg by mouth at bedtime.    nitroGLYCERIN (NITROSTAT) 0.4 MG SL tablet Place 1 tablet (0.4 mg total) under the tongue every 5 (five) minutes as needed for chest pain. 03/23/2021: Has on hand   omeprazole (PRILOSEC) 40 MG capsule TAKE ONE CAPSULE BY MOUTH ONCE  DAILY    rosuvastatin (CRESTOR) 40 MG tablet Take 1 tablet (40 mg total) by mouth daily.    triamcinolone cream (KENALOG) 0.1 % Apply topically 2 (two) times daily.    XARELTO 20 MG TABS tablet TAKE ONE TABLET BY MOUTH ONCE DAILY WITH SUPPER 08/26/2020: Last dose 08/23/20 for colonoscopy   No facility-administered encounter medications on file as of 01/13/2022.      [place] Clinical Pharmacist Assistant [phone]

## 2022-01-16 ENCOUNTER — Ambulatory Visit (INDEPENDENT_AMBULATORY_CARE_PROVIDER_SITE_OTHER): Payer: PPO | Admitting: Pharmacist

## 2022-01-16 DIAGNOSIS — E785 Hyperlipidemia, unspecified: Secondary | ICD-10-CM

## 2022-01-16 DIAGNOSIS — I10 Essential (primary) hypertension: Secondary | ICD-10-CM

## 2022-01-16 NOTE — Patient Instructions (Signed)
Hi Gabriel Aguilar,  I hope you feel better soon! So sorry again about your loss.  Don't forget to look into getting your shingles shot (2 doses) at the pharmacy when you are feeling better. Also, go ahead and try the imodium to see if this helps with your diarrhea.  Please reach out to me if you have any questions or need anything before our follow up!  Best, Maddie  Jeni Salles, PharmD, [place] [facility]   Visit Information   Goals Addressed   None    Patient Care Plan: CCM Pharmacy Care Plan     Problem Identified: Problem:  HTN, HLD, Paroxysmal Afib, CAD/NSTEMI, GERD      Long-Range Goal: Patient-Specific Goal   Start Date: 07/26/2020  Expected End Date: 07/26/2021  Recent Progress: On track  Priority: High  Note:   Current Barriers:  Unable to independently monitor therapeutic efficacy Unable to achieve control of cholesterol   Pharmacist Clinical Goal(s):  Patient will achieve adherence to monitoring guidelines and medication adherence to achieve therapeutic efficacy achieve control of cholesterol as evidenced by next lipid panel  through collaboration with PharmD and provider.   Interventions: 1:1 collaboration with Laurey Morale, MD regarding development and update of comprehensive plan of care as evidenced by provider attestation and co-signature Inter-disciplinary care team collaboration (see longitudinal plan of care) Comprehensive medication review performed; medication list updated in electronic medical record  Hypertension (BP goal <130/80) -Controlled (per home readings) -Current treatment: Hydrochlorothiazide '25mg'$ , 1 tablet once daily - Appropriate, Effective, Safe, Accessible Amlodipine '10mg'$ , 1 tablet once daily - Appropriate, Effective, Safe, Accessible -Medications previously tried: none  -Current home readings: 142/70 HR 71 (usually not this high) -Current dietary habits: tries to limit salt  intake -Current exercise habits: just started back at the gym this week -Denies hypotensive/hypertensive symptoms -Educated on Exercise goal of 150 minutes per week; Importance of home blood pressure monitoring; Proper BP monitoring technique; -Counseled to monitor BP at home weekly, document, and provide log at future appointments -Counseled on diet and exercise extensively Recommended to continue current medication Recommended checking BP at least minimally when feeling dizzy/lightheaded.  Hyperlipidemia: (LDL goal < 70) -Uncontrolled -Current treatment: Rosuvastatin '40mg'$ , 1 tablet once daily at 6 pm - Appropriate, Query effective, Safe, Accessible Fenofibrate '54mg'$ , 1 tablet once daily - Query Appropriate , Effective, Safe, Accessible -Medications previously tried: fish oil (patient stopped on his own) -Current dietary patterns: does a lot of baking and grilling; oatmeal for breakfast; has been eating out more and more red meat than usual -Current exercise habits: goes to the gym every day and uses the treadmill some days and weights most days -Educated on Cholesterol goals;  Importance of limiting foods high in cholesterol; Exercise goal of 150 minutes per week; -Counseled on diet and exercise extensively Recommended to continue current medication Recommended follow up with cardiology to recheck cholesterol  Atrial Fibrillation (Goal: prevent stroke and major bleeding) -Controlled -CHADSVASC: 3 -Current treatment: Rate control: metoprolol as needed - Appropriate, Effective, Safe, Accessible Anticoagulation: Xarelto '20mg'$  1 tablet once daily - Appropriate, Effective, Safe, Accessible -Medications previously tried: 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  CAD/history of MI (Goal: prevent  future heart attacks and strokes) -Controlled -Current treatment  Clopidogrel '75mg'$ , 1 tablet once daily - Appropriate, Effective, Safe, Accessible Nitroglycerin 0.'4mg'$ , 1 tablet under tongue as needed for chest pain - Appropriate, Effective, Safe, Accessible -Medications previously tried: none  -Recommended to continue current medication  GERD (Goal: minimize symptoms of acid reflux/heartburn) -Controlled -Current treatment  Omeprazole '40mg'$ , 1 capsule once daily - Appropriate, Effective, Safe, Accessible -Medications previously tried: none  -Counseled on non-pharmacological interventions for acid reflux. Take measures to prevent acid reflux, such as avoiding spicy foods, avoiding caffeine, avoid laying down a few hours after eating, and raising the head of the bed.  Allergic rhinitis (Goal: minimize symptoms of allergies) -Uncontrolled -Current treatment  Afrin only as needed (works through the night) - Appropriate, Effective, Query Safe, Accessible Fluticasone 32mg/act nasal spray, 1 spray into both nostrils at bedtime - not taking - Appropriate, Effective, Safe, Accessible Claritin 10 mg 1 tablet at night - Appropriate, Effective, Safe, Accessible -Medications previously tried: ipratropium (ineffective)  -Counseled on limiting use of Afrin to 3 consecutive days due to rebound congestion and increased BP Recommended trial of Flonase to prevent overuse of Afrin and can consider switching to alternative antihistamine such as Zyrtec or Allegra if Claritin is not helping   Health Maintenance -Vaccine gaps: shingles -Current therapy:  Ascorbic acid (Vitamin C) '1000mg'$ , 1 tablet once daily (recommended to drink plenty water with it)  Vitamin B complex daily Weider support prostate health daily Niacin 500 mg daily (for  skin) Vitamin D 2000 units daily Turmeric 1000 mg daily Zinc 50 mg daily -Educated on Cost vs benefit of each product must be carefully weighed by individual consumer -Patient is satisfied with current therapy and denies issues -Recommended to continue current medication  Patient Goals/Self-Care Activities Patient will:  - take medications as prescribed check blood pressure weekly, document, and provide at future appointments target a minimum of 150 minutes of moderate intensity exercise weekly  Follow Up Plan: Telephone follow up appointment with care management team member scheduled for: 6 months       Patient verbalizes understanding of instructions and care plan provided today and agrees to view in [facility] MyChart status and patient understanding of how to access instructions and care plan via MyChart confirmed with patient.    Telephone follow up appointment with pharmacy team member scheduled for:6 months  MViona Gilmore [facility]

## 2022-01-17 ENCOUNTER — Ambulatory Visit (INDEPENDENT_AMBULATORY_CARE_PROVIDER_SITE_OTHER): Payer: PPO | Admitting: Family Medicine

## 2022-01-17 ENCOUNTER — Encounter: Payer: Self-pay | Admitting: Family Medicine

## 2022-01-17 VITALS — BP 118/64 | HR 65 | Temp 97.9°F | Wt 192.0 lb

## 2022-01-17 DIAGNOSIS — R1011 Right upper quadrant pain: Secondary | ICD-10-CM

## 2022-01-17 DIAGNOSIS — U071 COVID-19: Secondary | ICD-10-CM

## 2022-01-17 DIAGNOSIS — G4483 Primary cough headache: Secondary | ICD-10-CM | POA: Diagnosis not present

## 2022-01-17 LAB — POC COVID19 BINAXNOW: SARS Coronavirus 2 Ag: POSITIVE — AB

## 2022-01-17 LAB — POCT RAPID STREP A (OFFICE): Rapid Strep A Screen: NEGATIVE

## 2022-01-17 LAB — POCT INFLUENZA A/B
Influenza A, POC: NEGATIVE
Influenza B, POC: NEGATIVE

## 2022-01-17 NOTE — Addendum Note (Signed)
Addended by: Wyvonne Lenz on: 01/17/2022 10:14 AM   Modules accepted: Orders

## 2022-01-17 NOTE — Progress Notes (Signed)
   Subjective:    Patient ID: Gabriel Aguilar, male    DOB: 24-Nov-1950, 71 y.o.   MRN: [id number]  HPI Here for several issues. First for the past 2 months he has had intermittent RUQ abdominal pains. These are sharp in nature but not very severe. They do not seem to have any relationship to eating. No fever or nausea. He has not had heartburn for years since he started taking Omeprazole daily. No bowel or bladder changes. Also for the past 7 days he has had a mild dry cough, slight SOB, and headaches. No chest pain or ST. 2 weeks ago he flew to [place] to visit his brother who had liver cancer. His brother passed away last week and Gabriel Aguilar flew back home 3 days ago. He has felt better the past 2 days. Now his wife ha shad similar symptoms for 2 days.    Review of Systems  Constitutional: Negative.   HENT: Negative.    Eyes: Negative.   Respiratory:  Positive for cough and shortness of breath. Negative for wheezing.   Cardiovascular: Negative.   Gastrointestinal: Negative.   Genitourinary: Negative.   Neurological:  Positive for headaches.       Objective:   Physical Exam Constitutional:      Appearance: Normal appearance. He is not ill-appearing.  HENT:     Right Ear: Tympanic membrane, ear canal and external ear normal.     Left Ear: Tympanic membrane, ear canal and external ear normal.     Nose: Nose normal.     Mouth/Throat:     Pharynx: Oropharynx is clear.  Eyes:     Conjunctiva/sclera: Conjunctivae normal.  Cardiovascular:     Rate and Rhythm: Normal rate and regular rhythm.     Pulses: Normal pulses.     Heart sounds: Normal heart sounds.  Pulmonary:     Effort: Pulmonary effort is normal.     Breath sounds: Normal breath sounds.  Abdominal:     General: Abdomen is flat. Bowel sounds are normal. There is no distension.     Palpations: Abdomen is soft. There is no mass.     Tenderness: There is no guarding or rebound.     Hernia: No hernia is present.     Comments:  Mildly tender in the epigastrium and RUQ   Lymphadenopathy:     Cervical: No cervical adenopathy.  Neurological:     Mental Status: He is alert.           Assessment & Plan:  The RUQ pain is likely to be from a gall bladder issue, so we will set up an abdominal US soon. He also has a Covid-19 infection. We are past the time frame to use an antiviral, and he seems to be getting over it anyway. His wife will set up a virtual visit with [place].  Alysia Penna, MD

## 2022-01-19 ENCOUNTER — Encounter: Payer: Self-pay | Admitting: Cardiology

## 2022-01-23 ENCOUNTER — Ambulatory Visit
Admission: RE | Admit: 2022-01-23 | Discharge: 2022-01-23 | Disposition: A | Discharge status: Home / Self Care | Payer: PPO | Source: Ambulatory Visit | Attending: Family Medicine | Admitting: Family Medicine

## 2022-01-23 DIAGNOSIS — R1011 Right upper quadrant pain: Secondary | ICD-10-CM

## 2022-01-23 DIAGNOSIS — K824 Cholesterolosis of gallbladder: Secondary | ICD-10-CM | POA: Diagnosis not present

## 2022-01-23 DIAGNOSIS — Z905 Acquired absence of kidney: Secondary | ICD-10-CM | POA: Diagnosis not present

## 2022-01-31 ENCOUNTER — Encounter: Payer: Self-pay | Admitting: Family Medicine

## 2022-01-31 ENCOUNTER — Ambulatory Visit (INDEPENDENT_AMBULATORY_CARE_PROVIDER_SITE_OTHER): Payer: PPO | Admitting: Family Medicine

## 2022-01-31 VITALS — BP 124/70 | HR 65 | Temp 98.0°F | Wt 194.0 lb

## 2022-01-31 DIAGNOSIS — M19041 Primary osteoarthritis, right hand: Secondary | ICD-10-CM | POA: Diagnosis not present

## 2022-01-31 DIAGNOSIS — R195 Other fecal abnormalities: Secondary | ICD-10-CM | POA: Diagnosis not present

## 2022-01-31 DIAGNOSIS — R1011 Right upper quadrant pain: Secondary | ICD-10-CM | POA: Diagnosis not present

## 2022-01-31 DIAGNOSIS — R040 Epistaxis: Secondary | ICD-10-CM | POA: Diagnosis not present

## 2022-01-31 DIAGNOSIS — M19042 Primary osteoarthritis, left hand: Secondary | ICD-10-CM | POA: Diagnosis not present

## 2022-01-31 MED ORDER — CHOLESTYRAMINE LIGHT 4 G PO PACK: 4.0000 g | PACK | Freq: Two times a day (BID) | ORAL | 11 refills | Status: AC

## 2022-01-31 NOTE — Progress Notes (Signed)
   Subjective:    Patient ID: Gabriel Aguilar, male    DOB: July 22, 1950, 71 y.o.   MRN: [id number]  HPI Here for several issues. First he still has a RUQ abdominal pain that started about 9 weeks ago. This pain is sharp in quality, and it comes and goes. No fever or nausea. He had an abdominal US on 01-23-22 showing a 4 mm gall bladder polyp, but this was otherwise normal. He says the pain is now becoming less intense, and he can go for hours without feeling it. He noticed yesterday that when he bent over to pick something up, this brought the pain out. There is no relationship to eating. The other issue is chronic loose stools. This started about 6 months ago. There is no watery diarrhea. The stools are light brown in color. No bloody or black stools. He very seldom has a formed stool. He had an abdominal CT last December that was normal. The third issue is pain and stiffness in the hands and fingers. He says heat helps briefly. He stays away from taking oral NSAIDs because he is on Plavix.    Review of Systems  Constitutional: Negative.   Respiratory: Negative.    Cardiovascular: Negative.   Gastrointestinal:  Positive for abdominal pain and diarrhea. Negative for abdominal distention, anal bleeding, blood in stool, constipation, nausea, rectal pain and vomiting.  Genitourinary: Negative.        Objective:   Physical Exam Constitutional:      General: He is not in acute distress.    Appearance: Normal appearance.  Cardiovascular:     Rate and Rhythm: Normal rate and regular rhythm.     Pulses: Normal pulses.     Heart sounds: Normal heart sounds.  Pulmonary:     Effort: Pulmonary effort is normal.     Breath sounds: Normal breath sounds.  Abdominal:     General: Abdomen is flat. Bowel sounds are normal. There is no distension.     Palpations: Abdomen is soft. There is no mass.     Tenderness: There is no guarding or rebound.     Hernia: No hernia is present.     Comments: He is mildly  tender in the RUQ just inferior to the lower rib margin   Musculoskeletal:     Comments: The PIPs and DIPs on all fingers are enlarged and tender   Neurological:     Mental Status: He is alert.           Assessment & Plan:  The RUQ pain now seems to be musculoskeletal in nature, and it seems to be slowly healing. We will observe only. As for the chronic loose stools, he will try Cholestyramine powder 4 grams BID. 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  Alysia Penna, MD

## 2022-02-03 ENCOUNTER — Telehealth: Payer: Self-pay | Admitting: Cardiology

## 2022-02-03 NOTE — Telephone Encounter (Signed)
Patient can go to [email or web address] And sign up for the jansen select program and get his Xarelto for $85/month or $240/3 month through the end of the year.

## 2022-02-03 NOTE — Telephone Encounter (Signed)
Returned call to patient who states that he wanted to know if there was a generic form of Xarelto. Advised patient that there was not. Patient states he will not qualify for patient assistance and is not interested in Coumadin. Patient would like to know if 30 day refills can be sent in as right now he pays 350$ for refill. Will forward to PharmD for assistance.

## 2022-02-03 NOTE — Telephone Encounter (Signed)
*  STAT* If patient is at the pharmacy, call can be transferred to refill team.   1. Which medications need to be refilled? (please list name of each medication and dose if known)   XARELTO 20 MG TABS tablet    2. Which pharmacy/location (including street and city if local pharmacy) is medication to be sent to? [id number] [place] - [address]  3. Do they need a 30 day or 90 day supply?  30 day    Pt c/o medication issue:  1. Name of Medication:   XARELTO 20 MG TABS tablet    2. How are you currently taking this medication (dosage and times per day)?  TAKE ONE TABLET BY MOUTH ONCE DAILY WITH SUPPER 3. Are you having a reaction (difficulty breathing--STAT)? No  4. What is your medication issue? Pt would like to know if there is a generic for this medication due to it being too expensive.

## 2022-02-03 NOTE — Telephone Encounter (Signed)
Called and spoke with patient and made him aware of the PharmD's recommendations. This was also sent to patient through [facility].   Patient states that he still needs a new prescription sent into the pharmacy for now as he is almost out of his Xarelto. Will forward back to PharmD for refill.

## 2022-02-06 MED ORDER — RIVAROXABAN 20 MG PO TABS: 20.0000 mg | ORAL_TABLET | Freq: Every day | ORAL | 0 refills | Status: DC

## 2022-02-07 DIAGNOSIS — E785 Hyperlipidemia, unspecified: Secondary | ICD-10-CM | POA: Diagnosis not present

## 2022-02-07 DIAGNOSIS — I10 Essential (primary) hypertension: Secondary | ICD-10-CM

## 2022-02-07 DIAGNOSIS — I4891 Unspecified atrial fibrillation: Secondary | ICD-10-CM | POA: Diagnosis not present

## 2022-02-13 DIAGNOSIS — E118 Type 2 diabetes mellitus with unspecified complications: Secondary | ICD-10-CM | POA: Diagnosis not present

## 2022-02-13 DIAGNOSIS — E785 Hyperlipidemia, unspecified: Secondary | ICD-10-CM | POA: Diagnosis not present

## 2022-02-13 DIAGNOSIS — I2510 Atherosclerotic heart disease of native coronary artery without angina pectoris: Secondary | ICD-10-CM | POA: Diagnosis not present

## 2022-02-13 LAB — LIPID PANEL
Chol/HDL Ratio: 2.8 ratio (ref 0.0–5.0)
Cholesterol, Total: 138 mg/dL (ref 100–199)
HDL: 49 mg/dL (ref >39)
LDL Chol Calc (NIH): 69 mg/dL (ref 0–99)
Triglycerides: 111 mg/dL (ref 0–149)
VLDL Cholesterol Cal: 20 mg/dL (ref 5–40)

## 2022-02-15 ENCOUNTER — Other Ambulatory Visit: Payer: Self-pay | Admitting: Cardiology

## 2022-02-16 ENCOUNTER — Telehealth: Payer: Self-pay | Admitting: Pharmacist

## 2022-02-16 NOTE — Chronic Care Management (AMB) (Signed)
Chronic Care Management Pharmacy Assistant   Name: Gabriel Aguilar  MRN: [id number] DOB: [birth year]  Reason for Encounter: Medication Review  /Medication Coordination   Recent office visits:  01/31/22 Laurey Morale, MD - Patient presented for RUQ abdominal pain and other concerns. Prescribed Cholestyramine Light. Stopped Polyethylene Glycol. Recommended Voltaren Gel.   01/17/22 Laurey Morale, MD - Patient presented for RUG abd pain and other concerns. No medication changes.   Recent consult visits:  01/31/22 [place], Ann Lions, PA-C  (Otolaryngology) - Patient presented for Epistaxis. Prescribed Bactroban ointment.   Hospital visits:  None in previous 6 months  Medications: Outpatient Encounter Medications as of 02/16/2022  Medication Sig Note   amLODipine (NORVASC) 10 MG tablet TAKE ONE TABLET BY MOUTH ONCE DAILY    Ascorbic Acid (VITAMIN C) 1000 MG tablet Take 1,000 mg by mouth daily.    b complex vitamins capsule Take 1 capsule by mouth daily.    Black Pepper-Turmeric (TURMERIC CURCUMIN) 08-998 MG CAPS Take 1,000 mg by mouth daily.    Cholecalciferol (VITAMIN D) 50 MCG (2000 UT) CAPS Take 1 capsule by mouth daily.    cholestyramine light (PREVALITE) 4 g packet Take 1 packet (4 g total) by mouth 2 (two) times daily.    clopidogrel (PLAVIX) 75 MG tablet TAKE ONE TABLET BY MOUTH EVERY MORNING with breakfast    dicyclomine (BENTYL) 20 MG tablet Take 1 tablet (20 mg total) by mouth 2 (two) times daily.    fenofibrate 54 MG tablet TAKE ONE TABLET BY MOUTH ONCE DAILY    hydrochlorothiazide (HYDRODIURIL) 25 MG tablet TAKE ONE TABLET BY MOUTH ONCE DAILY    LORazepam (ATIVAN) 0.5 MG tablet Take 1 tablet (0.5 mg total) by mouth at bedtime.    magnesium oxide (MAG-OX) 400 (240 Mg) MG tablet Take 400 mg by mouth daily.    metoprolol tartrate (LOPRESSOR) 25 MG tablet Take 0.5 tablets (12.5 mg total) by mouth every 8 (eight) hours as needed (ELEVATED HEART RATE). 03/23/2021: prn    niacin 500 MG tablet Take 500 mg by mouth at bedtime.    nitroGLYCERIN (NITROSTAT) 0.4 MG SL tablet Place 1 tablet (0.4 mg total) under the tongue every 5 (five) minutes as needed for chest pain. 03/23/2021: Has on hand   omeprazole (PRILOSEC) 40 MG capsule TAKE ONE CAPSULE BY MOUTH ONCE DAILY    rivaroxaban (XARELTO) 20 MG TABS tablet Take 1 tablet (20 mg total) by mouth daily with supper.    rosuvastatin (CRESTOR) 40 MG tablet Take 1 tablet (40 mg total) by mouth daily.    triamcinolone cream (KENALOG) 0.1 % Apply topically 2 (two) times daily.    zinc gluconate 50 MG tablet Take 50 mg by mouth daily.    No facility-administered encounter medications on file as of 02/16/2022.   Reviewed chart for medication changes ahead of medication coordination call.   BP Readings from Last 3 Encounters:  01/31/22 124/70  01/17/22 118/64  11/16/21 124/70    Lab Results  Component Value Date   HGBA1C 5.7 08/08/2021     Patient obtains medications through Adherence Packaging  90 Days   Last adherence delivery included:  Fenofibrate 54 mg: one tab once a day HCTZ 25 mg : one tab once a day Amlodipine 10 mg: one tab once daily Clopidogrel 75 mg: one tab once a day Omeprazole 40 mg: one capsule daily Rosuvastatin 40 mg: one tab once a day   Confirmed delivery date of 11/30/21, advised patient that  pharmacy will contact them the morning of delivery.   Patient is due for next adherence delivery on: 02/28/22. Called patient and reviewed medications and coordinated delivery. Packs 90 DS  This delivery to include: Fenofibrate 54 mg: one tab once a day HCTZ 25 mg : one tab once a day Amlodipine 10 mg: one tab once daily Clopidogrel 75 mg: one tab once a day Omeprazole 40 mg: one capsule daily Rosuvastatin 40 mg: one tab once a day Xarelto 20 mg : one tab once daily   Dispensed Days Supply Quantity Provider Pharmacy  CHOLESTYRAMINE LIGHT PACKET 01/31/2022 30 60 each      Dispensed Days  Supply Quantity Provider Pharmacy  LORAZEPAM 0.5 MG TABLET 12/30/2021 30 30 each      Dispensed Days Supply Quantity Provider Pharmacy  MUPIROCIN 2% OINTMENT 01/31/2022 7 22 g       Unable to reach to confirm  date of 02/28/22, left message for patient that pharmacy will contact them the morning of delivery.   Care Gaps: Foot Exam - Overdue Eye Exam - Overdue Diabetic Urine - Overdue Zoster Vaccine - Overdue Flu Vaccine - Overdue  HGB A1c - Overdue COVID Booster - Postponed CCM- 4/24 BP- 124/70 01/31/22 AWV- 05/04/21 Lab Results  Component Value Date   HGBA1C 5.7 08/08/2021     Star Rating Drugs: Rosuvastatin 40 mg - Last filled 12/02/21 90 DS at [facility] Pharmacist Assistant [phone]

## 2022-02-17 NOTE — Telephone Encounter (Signed)
Prescription refill request for Xarelto received.  Indication:afib Last office visit:5/23 Weight:88 kg Age:71 Scr:1.1 CrCl:74.44  ml/min  Prescription refilled

## 2022-02-21 DIAGNOSIS — L298 Other pruritus: Secondary | ICD-10-CM | POA: Diagnosis not present

## 2022-02-21 DIAGNOSIS — L821 Other seborrheic keratosis: Secondary | ICD-10-CM | POA: Diagnosis not present

## 2022-02-21 DIAGNOSIS — L218 Other seborrheic dermatitis: Secondary | ICD-10-CM | POA: Diagnosis not present

## 2022-02-21 DIAGNOSIS — Z872 Personal history of diseases of the skin and subcutaneous tissue: Secondary | ICD-10-CM | POA: Diagnosis not present

## 2022-02-21 DIAGNOSIS — R208 Other disturbances of skin sensation: Secondary | ICD-10-CM | POA: Diagnosis not present

## 2022-02-21 DIAGNOSIS — D1801 Hemangioma of skin and subcutaneous tissue: Secondary | ICD-10-CM | POA: Diagnosis not present

## 2022-02-21 DIAGNOSIS — Z08 Encounter for follow-up examination after completed treatment for malignant neoplasm: Secondary | ICD-10-CM | POA: Diagnosis not present

## 2022-02-21 DIAGNOSIS — L281 Prurigo nodularis: Secondary | ICD-10-CM | POA: Diagnosis not present

## 2022-02-21 DIAGNOSIS — L538 Other specified erythematous conditions: Secondary | ICD-10-CM | POA: Diagnosis not present

## 2022-02-21 DIAGNOSIS — Z85828 Personal history of other malignant neoplasm of skin: Secondary | ICD-10-CM | POA: Diagnosis not present

## 2022-02-21 DIAGNOSIS — Z789 Other specified health status: Secondary | ICD-10-CM | POA: Diagnosis not present

## 2022-02-21 DIAGNOSIS — L814 Other melanin hyperpigmentation: Secondary | ICD-10-CM | POA: Diagnosis not present

## 2022-02-24 DIAGNOSIS — G4733 Obstructive sleep apnea (adult) (pediatric): Secondary | ICD-10-CM | POA: Diagnosis not present

## 2022-03-15 DIAGNOSIS — H43392 Other vitreous opacities, left eye: Secondary | ICD-10-CM | POA: Diagnosis not present

## 2022-03-15 DIAGNOSIS — H25013 Cortical age-related cataract, bilateral: Secondary | ICD-10-CM | POA: Diagnosis not present

## 2022-03-15 DIAGNOSIS — H35362 Drusen (degenerative) of macula, left eye: Secondary | ICD-10-CM | POA: Diagnosis not present

## 2022-03-15 DIAGNOSIS — H2513 Age-related nuclear cataract, bilateral: Secondary | ICD-10-CM | POA: Diagnosis not present

## 2022-03-17 ENCOUNTER — Telehealth: Payer: Self-pay | Admitting: Pharmacist

## 2022-03-17 NOTE — Chronic Care Management (AMB) (Unsigned)
Chronic Care Management Pharmacy Assistant   Name: Gabriel Aguilar  MRN: [id number] DOB: 10/03/1950  Reason for Encounter: Medication Review/ Medication Coordination    Recent office visits:  None  Recent consult visits:  None  Hospital visits:  None in previous 6 months  Medications: Outpatient Encounter Medications as of 03/17/2022  Medication Sig Note   amLODipine (NORVASC) 10 MG tablet TAKE ONE TABLET BY MOUTH ONCE DAILY    Ascorbic Acid (VITAMIN C) 1000 MG tablet Take 1,000 mg by mouth daily.    b complex vitamins capsule Take 1 capsule by mouth daily.    Black Pepper-Turmeric (TURMERIC CURCUMIN) 08-998 MG CAPS Take 1,000 mg by mouth daily.    Cholecalciferol (VITAMIN D) 50 MCG (2000 UT) CAPS Take 1 capsule by mouth daily.    cholestyramine light (PREVALITE) 4 g packet Take 1 packet (4 g total) by mouth 2 (two) times daily.    clopidogrel (PLAVIX) 75 MG tablet TAKE ONE TABLET BY MOUTH EVERY MORNING with breakfast    dicyclomine (BENTYL) 20 MG tablet Take 1 tablet (20 mg total) by mouth 2 (two) times daily.    fenofibrate 54 MG tablet TAKE ONE TABLET BY MOUTH ONCE DAILY    hydrochlorothiazide (HYDRODIURIL) 25 MG tablet TAKE ONE TABLET BY MOUTH ONCE DAILY    LORazepam (ATIVAN) 0.5 MG tablet Take 1 tablet (0.5 mg total) by mouth at bedtime.    magnesium oxide (MAG-OX) 400 (240 Mg) MG tablet Take 400 mg by mouth daily.    metoprolol tartrate (LOPRESSOR) 25 MG tablet Take 0.5 tablets (12.5 mg total) by mouth every 8 (eight) hours as needed (ELEVATED HEART RATE). 03/23/2021: prn   niacin 500 MG tablet Take 500 mg by mouth at bedtime.    nitroGLYCERIN (NITROSTAT) 0.4 MG SL tablet Place 1 tablet (0.4 mg total) under the tongue every 5 (five) minutes as needed for chest pain. 03/23/2021: Has on hand   omeprazole (PRILOSEC) 40 MG capsule TAKE ONE CAPSULE BY MOUTH ONCE DAILY    rosuvastatin (CRESTOR) 40 MG tablet Take 1 tablet (40 mg total) by mouth daily.    triamcinolone cream  (KENALOG) 0.1 % Apply topically 2 (two) times daily.    XARELTO 20 MG TABS tablet TAKE ONE TABLET BY MOUTH DAILY with SUPPER    zinc gluconate 50 MG tablet Take 50 mg by mouth daily.    No facility-administered encounter medications on file as of 03/17/2022.   Reviewed chart for medication changes ahead of medication coordination call.  BP Readings from Last 3 Encounters:  01/31/22 124/70  01/17/22 118/64  11/16/21 124/70    Lab Results  Component Value Date   HGBA1C 5.7 08/08/2021     Patient obtains medications through Vials  90 Days   Last adherence delivery included: Fenofibrate 54 mg: one tab once a day HCTZ 25 mg : one tab once a day Amlodipine 10 mg: one tab once daily Clopidogrel 75 mg: one tab once a day Omeprazole 40 mg: one capsule daily Rosuvastatin 40 mg: one tab once a day Xarelto 20 mg : one tab once daily     Dispensed Days Supply Quantity Provider Pharmacy  CHOLESTYRAMINE LIGHT PACKET 01/31/2022 30 60 each          Dispensed Days Supply Quantity Provider Pharmacy  LORAZEPAM 0.5 MG TABLET 12/30/2021 30 30 each          Dispensed Days Supply Quantity Provider Pharmacy  MUPIROCIN 2% OINTMENT 01/31/2022 7 22 g  Unable to reach to confirm  date of 02/28/22, left message for patient that pharmacy will contact them the morning of delivery.   **Pharmacy reports pt declined this delivery upon arrival***  Patient is due for next adherence delivery on: 03/29/22. Called patient and reviewed medications and coordinated delivery. Vials w Safety Caps 90 DS  This delivery to include:  Fenofibrate 54 mg: one tab once a day HCTZ 25 mg : one tab once a day Amlodipine 10 mg: one tab once daily Clopidogrel 75 mg: one tab once a day Omeprazole 40 mg: one capsule daily Rosuvastatin 40 mg: one tab once a day Xarelto 20 mg : one tab once daily   FLUOCINOLONE 0.01% SOLUTION 02/22/2022 30    Patient will need a short fill of (med), prior to adherence  delivery. (To align with sync date or if PRN med)  Coordinated acute fill for (med) to be delivered (date).  Patient declined the following medications (meds) due to (reason)  Patient needs refills for ***.  Confirmed delivery date of 03/29/22, advised patient that pharmacy will contact them the morning of delivery.   Care Gaps: Foot Exam - Overdue Eye Exam - Overdue Diabetic Urine - Overdue Zoster Vaccine - Overdue COVID Booster - Overdue AWV- 05/04/21 BP- 124/70 01/31/22  Star Rating Drugs: Rosuvastatin 40 mg - Last filled 12/02/21 90 DS at [facility]  (pt declined Nov delivery)     [facility] Clinical Pharmacist Assistant [phone]

## 2022-03-22 ENCOUNTER — Other Ambulatory Visit: Payer: Self-pay | Admitting: Family Medicine

## 2022-03-22 NOTE — Telephone Encounter (Signed)
Pt LOV was on 01/31/22 Last refill was done on 08/08/21 Please advise

## 2022-04-14 ENCOUNTER — Telehealth: Payer: Self-pay | Admitting: Pharmacist

## 2022-04-14 NOTE — Progress Notes (Cosign Needed)
Care Coordination Pharmacy Assistant   Patient ID: Gabriel Aguilar, male   DOB: [birth year], 72 y.o.   MRN: [id number]  Reason for Encounter: Disease State   Conditions to be addressed/monitored: HTN  Recent office visits:  None  Recent consult visits:  None  Hospital visits:  None in previous 6 months  Medications: Outpatient Encounter Medications as of 04/14/2022  Medication Sig Note   amLODipine (NORVASC) 10 MG tablet TAKE ONE TABLET BY MOUTH ONCE DAILY    Ascorbic Acid (VITAMIN C) 1000 MG tablet Take 1,000 mg by mouth daily.    b complex vitamins capsule Take 1 capsule by mouth daily.    Black Pepper-Turmeric (TURMERIC CURCUMIN) 08-998 MG CAPS Take 1,000 mg by mouth daily.    Cholecalciferol (VITAMIN D) 50 MCG (2000 UT) CAPS Take 1 capsule by mouth daily.    cholestyramine light (PREVALITE) 4 g packet Take 1 packet (4 g total) by mouth 2 (two) times daily.    clopidogrel (PLAVIX) 75 MG tablet TAKE ONE TABLET BY MOUTH EVERY MORNING with breakfast    dicyclomine (BENTYL) 20 MG tablet Take 1 tablet (20 mg total) by mouth 2 (two) times daily.    fenofibrate 54 MG tablet TAKE ONE TABLET BY MOUTH ONCE DAILY    hydrochlorothiazide (HYDRODIURIL) 25 MG tablet TAKE ONE TABLET BY MOUTH ONCE DAILY    LORazepam (ATIVAN) 0.5 MG tablet TAKE 1 TABLET BY MOUTH AT BEDTIME.    magnesium oxide (MAG-OX) 400 (240 Mg) MG tablet Take 400 mg by mouth daily.    metoprolol tartrate (LOPRESSOR) 25 MG tablet Take 0.5 tablets (12.5 mg total) by mouth every 8 (eight) hours as needed (ELEVATED HEART RATE). 03/23/2021: prn   niacin 500 MG tablet Take 500 mg by mouth at bedtime.    nitroGLYCERIN (NITROSTAT) 0.4 MG SL tablet Place 1 tablet (0.4 mg total) under the tongue every 5 (five) minutes as needed for chest pain. 03/23/2021: Has on hand   omeprazole (PRILOSEC) 40 MG capsule TAKE ONE CAPSULE BY MOUTH ONCE DAILY    rosuvastatin (CRESTOR) 40 MG tablet Take 1 tablet (40 mg total) by mouth daily.    triamcinolone  cream (KENALOG) 0.1 % Apply topically 2 (two) times daily.    XARELTO 20 MG TABS tablet TAKE ONE TABLET BY MOUTH DAILY with SUPPER    zinc gluconate 50 MG tablet Take 50 mg by mouth daily.    No facility-administered encounter medications on file as of 04/14/2022.   Reviewed chart prior to disease state call. Spoke with patient regarding BP  Recent Office Vitals: BP Readings from Last 3 Encounters:  01/31/22 124/70  01/17/22 118/64  11/16/21 124/70   Pulse Readings from Last 3 Encounters:  01/31/22 65  01/17/22 65  11/16/21 60    Wt Readings from Last 3 Encounters:  01/31/22 194 lb (88 kg)  01/17/22 192 lb (87.1 kg)  11/16/21 197 lb (89.4 kg)     Kidney Function Lab Results  Component Value Date/Time   CREATININE 1.14 08/08/2021 09:11 AM   CREATININE 1.07 03/23/2021 11:46 AM   CREATININE 1.18 02/10/2016 01:13 PM   GFR 64.16 08/08/2021 09:11 AM   GFRNONAA >60 03/23/2021 11:46 AM   GFRAA >60 05/30/2018 01:47 AM       Latest Ref Rng & Units 08/08/2021    9:11 AM 03/23/2021   11:46 AM 08/04/2020    9:14 AM  BMP  Glucose 70 - 99 mg/dL 101  96  96   BUN 6 - 23  mg/dL '20  17  25   '$ Creatinine 0.40 - 1.50 mg/dL 1.14  1.07  1.13   Sodium 135 - 145 mEq/L 137  136  136   Potassium 3.5 - 5.1 mEq/L 3.9  3.6  3.5   Chloride 96 - 112 mEq/L 97  99  96   CO2 19 - 32 mEq/L '30  28  30   '$ Calcium 8.4 - 10.5 mg/dL 9.8  10.0  9.9     Current antihypertensive regimen:  Hydrochlorothiazide '25mg'$ , 1 tablet once daily - Appropriate, Effective, Safe, Accessible Amlodipine '10mg'$ , 1 tablet once daily - Appropriate, Effective, Safe, Accessible  Unable to reach patient for assessment  Care Gaps: Foot Exam - Overdue Eye Exam - Overdue Diabetic Urine - Overdue Zoster Vaccine - Overdue Flu Vaccine - Overdue COVID Booster - Overdue A1C - Overdue AWV - 05/04/21  Star Rating Drugs: Rosuvastatin 40 mg - Last filled 03/18/22 90 DS at [facility] Pharmacist  Assistant [phone]

## 2022-04-18 DIAGNOSIS — H25813 Combined forms of age-related cataract, bilateral: Secondary | ICD-10-CM | POA: Diagnosis not present

## 2022-04-28 IMAGING — CT CT ABD-PELV W/ CM
2 of 5 series · 16 of 46 positions shown, 18 images · IV contrast (omnipaque)
Comparison: None.

CLINICAL DATA: Left lower quadrant pain.

EXAM:
CT ABDOMEN AND PELVIS WITH CONTRAST
TECHNIQUE: Multidetector CT imaging of the abdomen and pelvis was performed
using the standard protocol following bolus administration of
intravenous contrast.
CONTRAST:  100mL OMNIPAQUE IOHEXOL 300 MG/ML  SOLN

[Series 2: abd pel w · axial · 0.74mm/px · z∈[+722,+1147]mm · 13 of 96 slices shown, 15 images]
[im 6/96  soft-tissue]
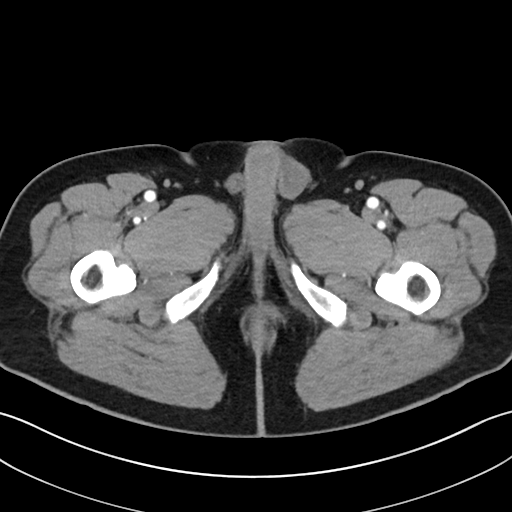
[im 6/96  bone]
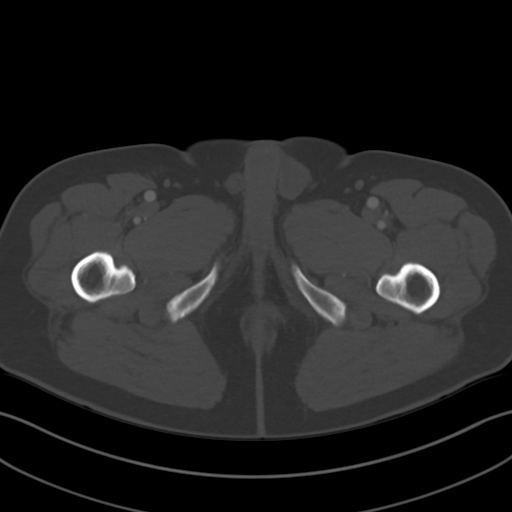
[im 16/96  soft-tissue]
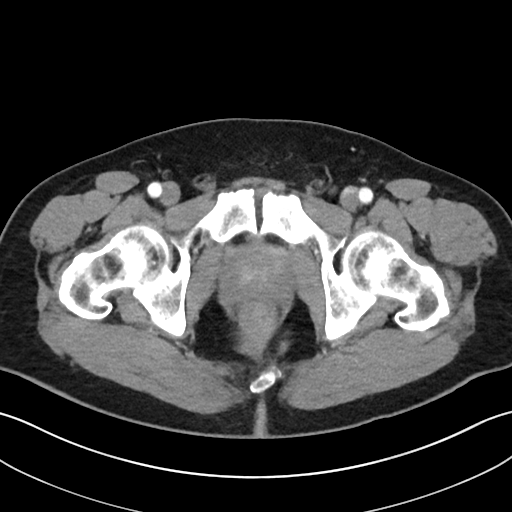
[im 21/96  soft-tissue]
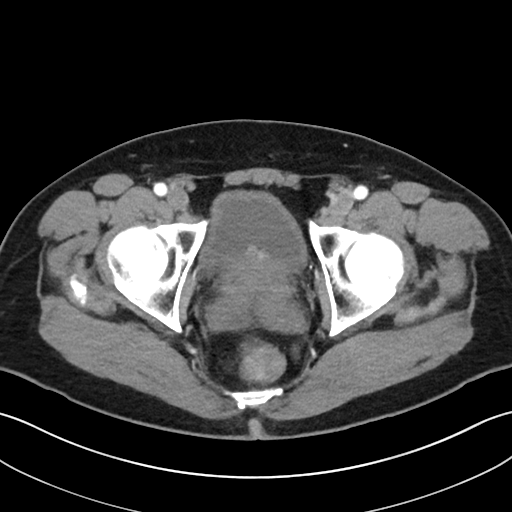
[im 26/96  soft-tissue]
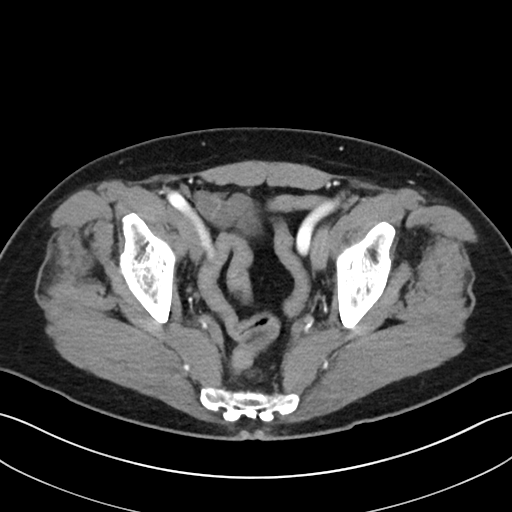
[im 36/96  soft-tissue]
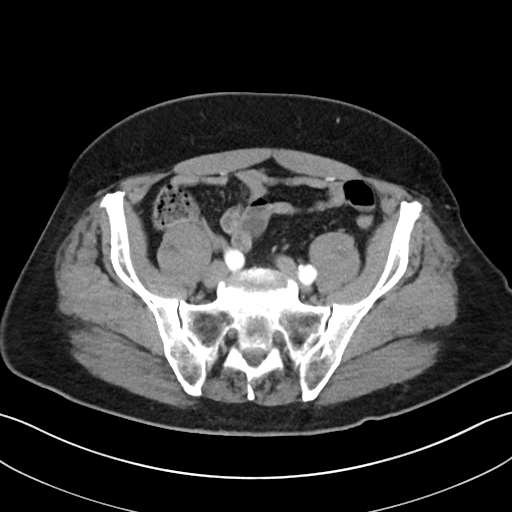
[im 41/96  soft-tissue]
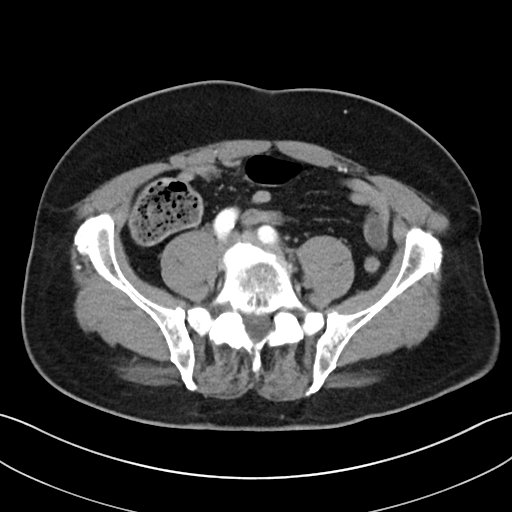
[im 51/96  soft-tissue]
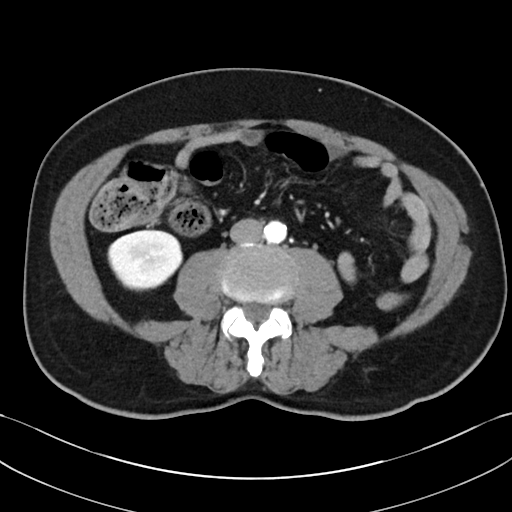
[im 56/96  soft-tissue]
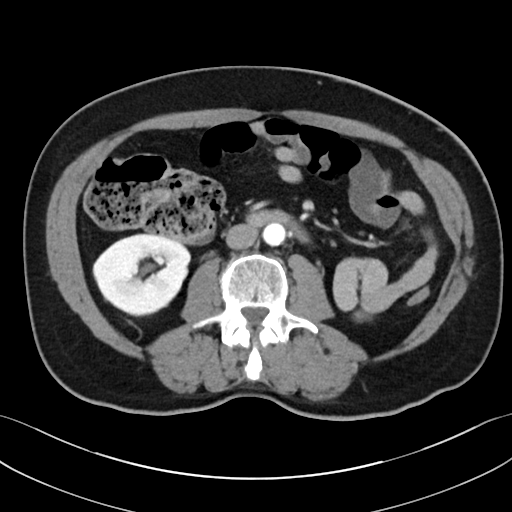
[im 61/96  soft-tissue]
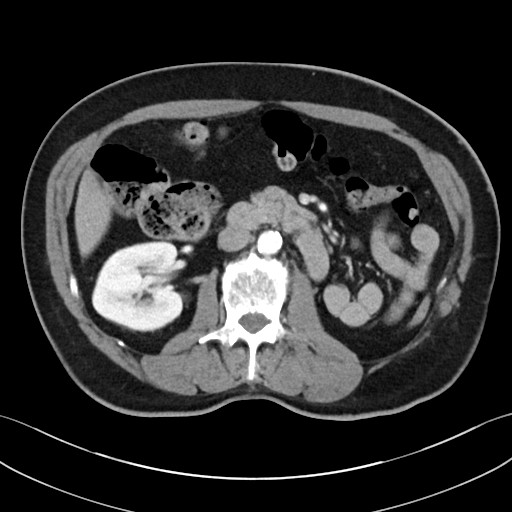
[im 61/96  bone]
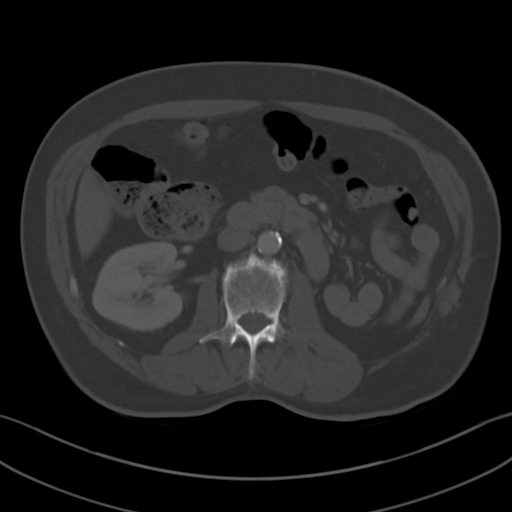
[im 71/96  soft-tissue]
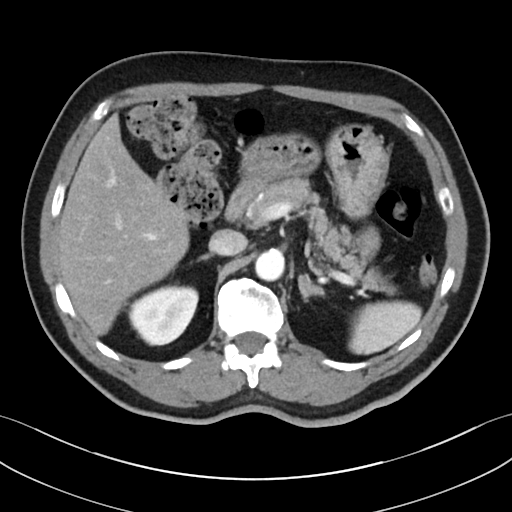
[im 76/96  soft-tissue]
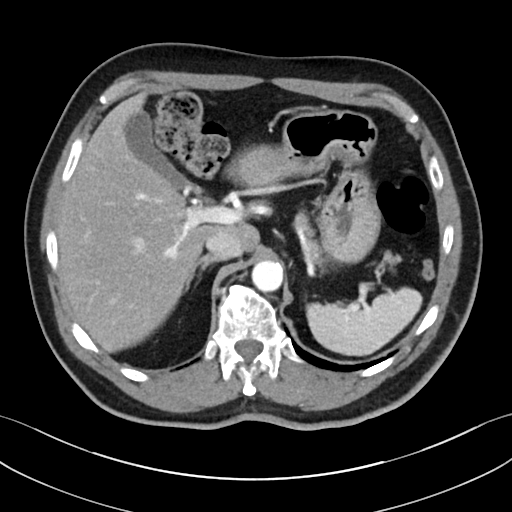
[im 81/96  soft-tissue]
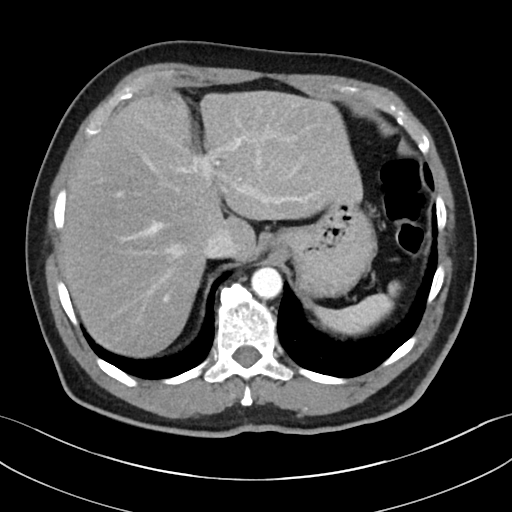
[im 91/96  soft-tissue]
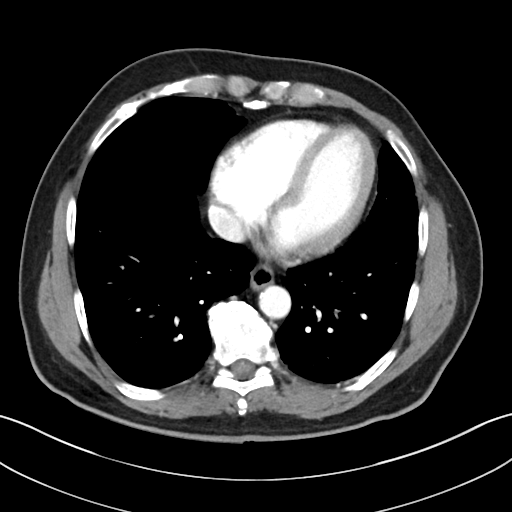

[Series 5: coronal · coronal · 0.67mm/px · 3 of 88 slices shown]
[im 30/88  soft-tissue]
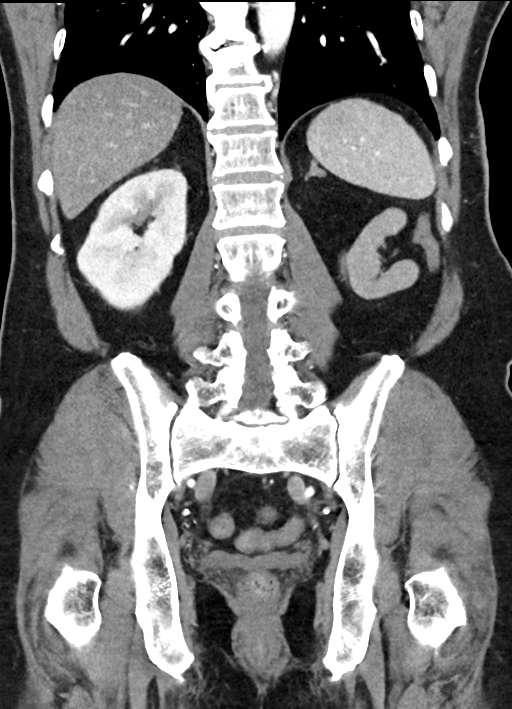
[im 39/88  soft-tissue]
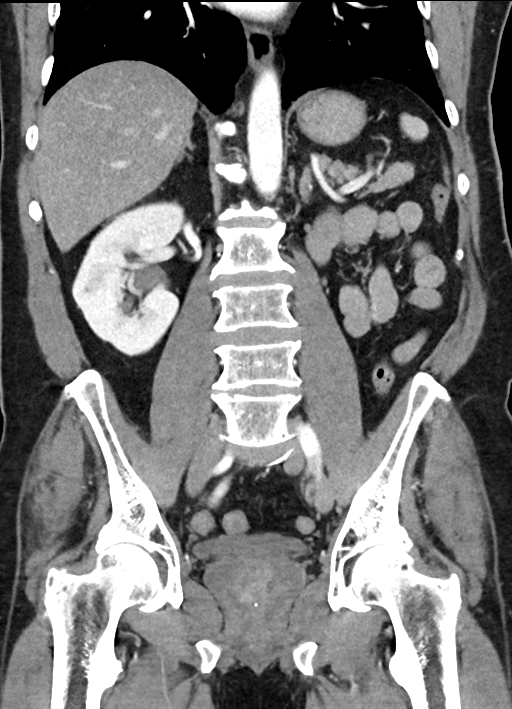
[im 49/88  soft-tissue]
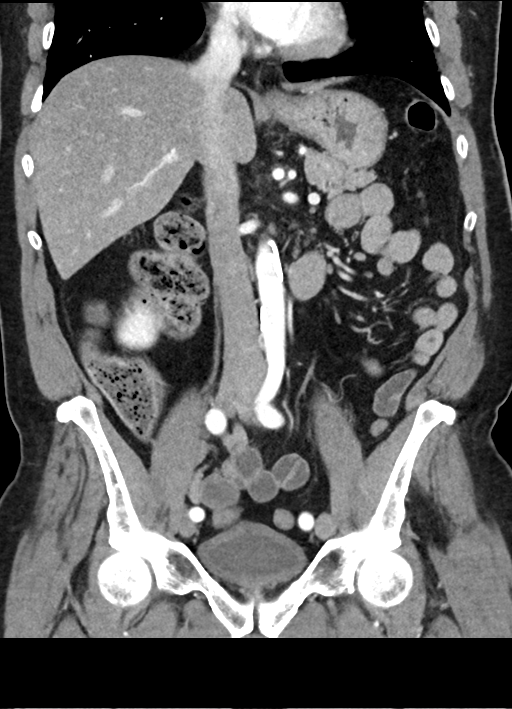

[16 of 46 positions shown; findings below may reference images not displayed]

FINDINGS: Lower Chest: No acute findings.

Hepatobiliary: No hepatic masses identified. Moderate diffuse
hepatic steatosis is seen. Gallbladder is unremarkable. No evidence
of biliary ductal dilatation.

Pancreas:  No mass or inflammatory changes.

Spleen: Within normal limits in size and appearance.

Adrenals/Urinary Tract: Prior left nephrectomy. Normal appearance of
the right kidney. No masses identified. No evidence of ureteral
calculi or hydronephrosis.

Stomach/Bowel: No evidence of obstruction, inflammatory process or
abnormal fluid collections.

Vascular/Lymphatic: No pathologically enlarged lymph nodes. No acute
vascular findings. Aortic atherosclerotic calcification noted.

Reproductive: Mildly enlarged prostate gland, mass effect on bladder
base.

Other:  None.

Musculoskeletal:  No suspicious bone lesions identified.
IMPRESSION: No acute findings within the abdomen or pelvis.

Moderate hepatic steatosis.

Mildly enlarged prostate.

Prior left nephrectomy.

Aortic Atherosclerosis ([id number]-[id number].E).

## 2022-05-08 ENCOUNTER — Ambulatory Visit (INDEPENDENT_AMBULATORY_CARE_PROVIDER_SITE_OTHER): Payer: PPO

## 2022-05-08 VITALS — Ht 68.0 in | Wt 194.0 lb

## 2022-05-08 DIAGNOSIS — Z0000 Encounter for general adult medical examination without abnormal findings: Secondary | ICD-10-CM | POA: Diagnosis not present

## 2022-05-08 NOTE — Progress Notes (Signed)
Subjective:   Gabriel Aguilar is a 72 y.o. male who presents for Medicare Annual/Subsequent preventive examination.  Review of Systems    Virtual Visit via Telephone Note  I connected with  Gabriel Aguilar on 05/08/22 at  3:15 PM EST by telephone and verified that I am speaking with the correct person using two identifiers.  Location: Patient: Home Provider: Office Persons participating in the virtual visit: patient/Nurse Health Advisor   I discussed the limitations, risks, security and privacy concerns of performing an evaluation and management service by telephone and the availability of in person appointments. The patient expressed understanding and agreed to proceed.  Interactive audio and video telecommunications were attempted between this nurse and patient, however failed, due to patient having technical difficulties OR patient did not have access to video capability.  We continued and completed visit with audio only.  Some vital signs may be absent or patient reported.   Criselda Peaches, LPN  Cardiac Risk Factors include: advanced age (>63mn, >>40women);hypertension;male gender     Objective:    Today's Vitals   05/08/22 1514  Weight: 194 lb (88 kg)  Height: '5\' 8"'$  (1.727 m)   Body mass index is 29.5 kg/m.     05/08/2022    3:23 PM 05/04/2021    3:29 PM 03/23/2021   11:48 AM 03/17/2020    2:48 PM 05/27/2018    6:00 PM 05/13/2018    2:19 PM 05/11/2015    4:06 PM  Advanced Directives  Does Patient Have a Medical Advance Directive? No No No No No No No  Would patient like information on creating a medical advance directive? No - Patient declined No - Patient declined Yes (ED - Information included in AVS) No - Patient declined No - Patient declined Yes (MAU/Ambulatory/Procedural Areas - Information given) No - patient declined information    Current Medications (verified) Outpatient Encounter Medications as of 05/08/2022  Medication Sig   amLODipine (NORVASC) 10 MG  tablet TAKE ONE TABLET BY MOUTH ONCE DAILY   Ascorbic Acid (VITAMIN C) 1000 MG tablet Take 1,000 mg by mouth daily.   b complex vitamins capsule Take 1 capsule by mouth daily.   Black Pepper-Turmeric (TURMERIC CURCUMIN) 08-998 MG CAPS Take 1,000 mg by mouth daily.   Cholecalciferol (VITAMIN D) 50 MCG (2000 UT) CAPS Take 1 capsule by mouth daily.   cholestyramine light (PREVALITE) 4 g packet Take 1 packet (4 g total) by mouth 2 (two) times daily.   clopidogrel (PLAVIX) 75 MG tablet TAKE ONE TABLET BY MOUTH EVERY MORNING with breakfast   dicyclomine (BENTYL) 20 MG tablet Take 1 tablet (20 mg total) by mouth 2 (two) times daily.   fenofibrate 54 MG tablet TAKE ONE TABLET BY MOUTH ONCE DAILY   hydrochlorothiazide (HYDRODIURIL) 25 MG tablet TAKE ONE TABLET BY MOUTH ONCE DAILY   LORazepam (ATIVAN) 0.5 MG tablet TAKE 1 TABLET BY MOUTH AT BEDTIME.   magnesium oxide (MAG-OX) 400 (240 Mg) MG tablet Take 400 mg by mouth daily.   metoprolol tartrate (LOPRESSOR) 25 MG tablet Take 0.5 tablets (12.5 mg total) by mouth every 8 (eight) hours as needed (ELEVATED HEART RATE).   niacin 500 MG tablet Take 500 mg by mouth at bedtime.   nitroGLYCERIN (NITROSTAT) 0.4 MG SL tablet Place 1 tablet (0.4 mg total) under the tongue every 5 (five) minutes as needed for chest pain.   omeprazole (PRILOSEC) 40 MG capsule TAKE ONE CAPSULE BY MOUTH ONCE DAILY   rosuvastatin (CRESTOR) 40  MG tablet Take 1 tablet (40 mg total) by mouth daily.   triamcinolone cream (KENALOG) 0.1 % Apply topically 2 (two) times daily.   XARELTO 20 MG TABS tablet TAKE ONE TABLET BY MOUTH DAILY with SUPPER   zinc gluconate 50 MG tablet Take 50 mg by mouth daily.   No facility-administered encounter medications on file as of 05/08/2022.    Allergies (verified) Patient has no known allergies.   History: Past Medical History:  Diagnosis Date   Arthritis    Coronary artery disease    Erectile dysfunction    GERD (gastroesophageal reflux disease)     Hiatal hernia    History of pericarditis    HTN (hypertension)    Hyperlipidemia LDL goal <70    Hypogonadism male    has seen  Dr. Orland Mustard   Palpitation    a. montior 2 weeks 2014--> no arrhythmia   Paroxysmal atrial fibrillation ([place])    Sleep apnea, central    wears a CPAP at night    Past Surgical History:  Procedure Laterality Date   BACK SURGERY  1994   L4-5   CARDIAC CATHETERIZATION     COLONOSCOPY  08/26/2020   per Dr. 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    [place]   disc at L4-L5   VASECTOMY     Family History  Problem Relation Age of Onset   Hyperlipidemia Father 67       KIDNEY FAILURE   Heart disease Father    Hypertension Father    Kidney disease Father    Diabetes Father    Heart disease Mother 32   Other Sister        LIVER FAILURE   CVA Brother    Other Sister        AT BIRTH   Heart attack Sister    CVA Brother    Other Brother        SEVERAL CABG   Colon cancer Neg Hx    Social History   Socioeconomic History   Marital status: Married    Spouse name: Not on file   Number of children: 2   Years of education: Not on file   Highest education level: Not on file  Occupational History   Occupation: [employer]: works FT from home  Tobacco Use   Smoking status: Former    Packs/day: 1.50    Types: Cigarettes    Start date: 04/10/1964    Quit date: 03/06/1983    Years since quitting: 39.2    Smokeless tobacco: Never  Vaping Use   Vaping Use: Never used  Substance and Sexual Activity   Alcohol use: Yes    Alcohol/week: 1.0 standard drink of alcohol    Types: 1 Glasses of wine per week    Comment: rare   Drug use: No   Sexual activity: Not on file  Other Topics Concern  Not on file  Social History Narrative   05/13/2018: Lives with wife in multi-level home, considering down-sizing in next few years, has two grown children.   Not quite ready to retire.   Loves to travel, planning [place] trip with wife in April   Goes to [place], uses elliptical and [employer]   Social Determinants of Health   Financial Resource Strain: Low Risk  (05/08/2022)   Overall Financial Resource Strain (CARDIA)    Difficulty of Paying Living Expenses: Not hard at all  Food Insecurity: No Food Insecurity (05/08/2022)   Hunger Vital Sign    Worried About Running Out of Food in the Last Year: Never true    Ran Out of Food in the Last Year: Never true  Transportation Needs: No Transportation Needs (05/08/2022)   PRAPARE - Hydrologist (Medical): No    Lack of Transportation (Non-Medical): No  Physical Activity: Sufficiently Active (05/08/2022)   Exercise Vital Sign    Days of Exercise per Week: 3 days    Minutes of Exercise per Session: 60 min  Stress: No Stress Concern Present (05/08/2022)   [place]    Feeling of Stress : Not at all  Social Connections: [place] (05/08/2022)   Social Connection and Isolation Panel [NHANES]    Frequency of Communication with Friends and Family: More than three times a week    Frequency of Social Gatherings with Friends and Family: More than three times a week    Attends Religious Services: More than 4 times per year    Active Member of [employer] or Organizations: Yes    Attends Music therapist: More than 4 times per year    Marital Status: Married     Tobacco Counseling Counseling given: Not Answered   Clinical Intake:  Pre-visit preparation completed: Yes  Pain : No/denies pain     BMI - recorded: 29.5 Nutritional Status: BMI 25 -29 Overweight Nutritional Risks: None Diabetes: No  How often do you need to have someone help you when you read instructions, pamphlets, or other written materials from your doctor or pharmacy?: 1 - Never  Diabetic?  No  Interpreter Needed?: No  Information entered by :: Rolene Arbour LPN   Activities of Daily Living    05/08/2022    3:19 PM 05/08/2022    9:09 AM  In your present state of health, do you have any difficulty performing the following activities:  Hearing? 0 0  Vision? 1 1  Comment Followed by [facility].   Difficulty concentrating or making decisions? 0 0  Walking or climbing stairs? 0 0  Dressing or bathing? 0 0  Doing errands, shopping? 0 0  Preparing Food and eating ? N N  Using the Toilet? N N  In the past six months, have you accidently leaked urine? N N  Do you have problems with loss of bowel control? N N  Managing your Medications? N N  Managing your Finances? N N  Housekeeping or managing your Housekeeping? N N    Patient Care Team: Laurey Morale, MD as PCP - General (Family Medicine) [name], MD as PCP - Cardiology (Cardiology) Thompson Grayer, MD as PCP - Electrophysiology (Cardiology) Rigoberto Noel, MD as Consulting Physician (Pulmonary Disease) Magrinat, Virgie Dad, MD (Inactive) as Consulting Physician (Oncology) Viona Gilmore, [place] (Inactive) as Pharmacist (Pharmacist)  Indicate any recent Medical Services you may have received from other  than [facility] providers in the past year (date may be approximate).     Assessment:   This is a routine wellness examination for Gabriel Aguilar.  Hearing/Vision screen Hearing Screening - Comments:: Denies hearing difficulties   Vision Screening - Comments:: Wears rx glasses - up to date with routine eye  exams with  [place] issues and exercise activities discussed: Current Exercise Habits: Home exercise routine, Type of exercise: walking, Time (Minutes): 60, Frequency (Times/Week): 3, Weekly Exercise (Minutes/Week): 180, Intensity: Moderate, Exercise limited by: None identified   Goals Addressed               This Visit's Progress     Patient Stated (pt-stated)        I will continue to exercise daily 45 minutes-60 minutes and lose more weight.       Depression Screen    05/08/2022    3:19 PM 01/31/2022   10:42 AM 01/17/2022    9:28 AM 11/16/2021    8:34 AM 08/08/2021    8:32 AM 05/04/2021    3:24 PM 08/04/2020    8:32 AM  PHQ 2/9 Scores  PHQ - 2 Score 0 0 0 0 0 0 0  PHQ- 9 Score  1 0 '1 4  1    '$ Fall Risk    05/08/2022    3:21 PM 05/08/2022    9:09 AM 01/31/2022   10:42 AM 01/17/2022    9:28 AM 01/17/2022    9:15 AM  Fall Risk   Falls in the past year? 0 0 0 0 0  Number falls in past yr: 0  0 0   Injury with Fall? 0  0 0   Risk for fall due to : No Fall Risks  No Fall Risks No Fall Risks   Follow up Falls prevention discussed  Falls evaluation completed Falls evaluation completed     FALL RISK PREVENTION PERTAINING TO THE HOME:  Any stairs in or around the home? Yes  If so, are there any without handrails? No  Home free of loose throw rugs in walkways, pet beds, electrical cords, etc? Yes  Adequate lighting in your home to reduce risk of falls? Yes   ASSISTIVE DEVICES UTILIZED TO PREVENT FALLS:  Life alert? No  Use of a cane, walker or w/c? No  Grab bars in the bathroom? No  Shower chair or bench in shower? No  Elevated toilet seat or a handicapped toilet? No   TIMED UP AND GO:  Was the test performed? No . Audio Visit   Cognitive Function:        05/08/2022    3:23 PM 05/04/2021    3:27 PM  6CIT Screen  What Year? 0 points 0 points  What month? 0 points 0 points  What time? 0 points 0 points  Count back from 20 0 points 0 points   Months in reverse 0 points 0 points  Repeat phrase 0 points 0 points  Total Score 0 points 0 points    Immunizations Immunization History  Administered Date(s) Administered   Fluad Quad(high Dose 65+) 03/17/2020   Influenza Split 02/22/2011   PFIZER(Purple Top)SARS-COV-2 Vaccination 11/05/2019, 12/05/2019, 07/31/2020   Pneumococcal Conjugate-13 05/13/2018   Pneumococcal Polysaccharide-23 08/04/2020   Tdap 08/04/2020    TDAP status: Up to date  Flu Vaccine status: Declined, Education has been provided regarding the importance of this vaccine but patient still declined. Advised may receive this vaccine at local pharmacy or Health Dept.  Aware to provide a copy of the vaccination record if obtained from local pharmacy or Health Dept. Verbalized acceptance and understanding.  Pneumococcal vaccine status: Up to date  Covid-19 vaccine status: Completed vaccines  Qualifies for Shingles Vaccine? Yes   Zostavax completed No   Shingrix Completed?: No.    Education has been provided regarding the importance of this vaccine. Patient has been advised to call insurance company to determine out of pocket expense if they have not yet received this vaccine. Advised may also receive vaccine at local pharmacy or Health Dept. Verbalized acceptance and understanding.  Screening Tests Health Maintenance  Topic Date Due   HEMOGLOBIN A1C  02/08/2022   OPHTHALMOLOGY EXAM  05/08/2022 (Originally 11/22/1960)   Diabetic kidney evaluation - Urine ACR  05/09/2022 (Originally 11/22/1968)   FOOT EXAM  05/09/2022 (Originally 11/22/1960)   COVID-19 Vaccine (4 - 2023-24 season) 05/24/2022 (Originally 12/09/2021)   INFLUENZA VACCINE  07/09/2022 (Originally 11/08/2021)   Zoster Vaccines- Shingrix (1 of 2) 08/07/2022 (Originally 11/22/1969)   Diabetic kidney evaluation - eGFR measurement  08/09/2022   Medicare Annual Wellness (AWV)  05/09/2023   COLONOSCOPY (Pts 45-78yr Insurance coverage will need to be confirmed)   08/27/2023   DTaP/Tdap/Td (2 - Td or Tdap) 08/05/2030   Pneumonia Vaccine 72 Years old  Completed   Hepatitis C Screening  Completed   HPV VACCINES  Aged Out    Health Maintenance  Health Maintenance Due  Topic Date Due   HEMOGLOBIN A1C  02/08/2022    Colorectal cancer screening: Type of screening: Colonoscopy. Completed 08/26/20. Repeat every 3 years  Lung Cancer Screening: (Low Dose CT Chest recommended if Age 871-80years, 30 pack-year currently smoking OR have quit w/in 15years.) does not qualify.     Additional Screening:  Hepatitis C Screening: does qualify; Completed 06/30/19  Vision Screening: Recommended annual ophthalmology exams for early detection of glaucoma and other disorders of the eye. Is the patient up to date with their annual eye exam?  Yes  Who is the provider or what is the name of the office in which the patient attends annual eye exams[facility] not [facility] a provider, would they like to be referred to a provider to establish care? No .   Dental Screening: Recommended annual dental exams for proper oral hygiene  Community Resource Referral / Chronic Care Management:  CRR required this visit?  No   CCM required this visit?  No      Plan:     I have personally reviewed and noted the following in the patient's chart:   Medical and social history Use of alcohol, tobacco or illicit drugs  Current medications and supplements including opioid prescriptions. Patient is not currently taking opioid prescriptions. Functional ability and status Nutritional status Physical activity Advanced directives List of other physicians Hospitalizations, surgeries, and ER visits in previous 12 months Vitals Screenings to include cognitive, depression, and falls Referrals and appointments  In addition, I have reviewed and discussed with patient certain preventive protocols, quality metrics, and best practice recommendations. A written  personalized care plan for preventive services as well as general preventive health recommendations were provided to patient.     BCriselda Peaches LPN   14/25/9563  Nurse Notes:Patient due Diabetic Kidney Evaluation- Urine and Hemoglobin A1C

## 2022-05-08 NOTE — Patient Instructions (Addendum)
Gabriel Aguilar , Thank you for taking time to come for your Medicare Wellness Visit. I appreciate your ongoing commitment to your health goals. Please review the following plan we discussed and let me know if I can assist you in the future.   These are the goals we discussed:  Goals       Patient Stated      Continue to lose weight.       Patient Stated (pt-stated)      I will continue to exercise daily 45 minutes-60 minutes and lose more weight.        This is a list of the screening recommended for you and due dates:  Health Maintenance  Topic Date Due   Hemoglobin A1C  02/08/2022   Eye exam for diabetics  05/08/2022*   Yearly kidney health urinalysis for diabetes  05/09/2022*   Complete foot exam   05/09/2022*   COVID-19 Vaccine (4 - 2023-24 season) 05/24/2022*   Flu Shot  07/09/2022*   Zoster (Shingles) Vaccine (1 of 2) 08/07/2022*   Yearly kidney function blood test for diabetes  08/09/2022   Medicare Annual Wellness Visit  05/09/2023   Colon Cancer Screening  08/27/2023   DTaP/Tdap/Td vaccine (2 - Td or Tdap) 08/05/2030   Pneumonia Vaccine  Completed   Hepatitis C Screening: USPSTF Recommendation to screen - Ages 18-79 yo.  Completed   HPV Vaccine  Aged Out  *Topic was postponed. The date shown is not the original due date.    Advanced directives: Advance directive discussed with you today. Even though you declined this today, please call our office should you change your mind, and we can give you the proper paperwork for you to fill out.   Conditions/risks identified: None  Next appointment: Follow up in one year for your annual wellness visit.    Preventive Care 42 Years and Older, Male  Preventive care refers to lifestyle choices and visits with your health care provider that can promote health and wellness. What does preventive care include? A yearly physical exam. This is also called an annual well check. Dental exams once or twice a year. Routine eye exams.  Ask your health care provider how often you should have your eyes checked. Personal lifestyle choices, including: Daily care of your teeth and gums. Regular physical activity. Eating a healthy diet. Avoiding tobacco and drug use. Limiting alcohol use. Practicing safe sex. Taking low doses of aspirin every day. Taking vitamin and mineral supplements as recommended by your health care provider. What happens during an annual well check? The services and screenings done by your health care provider during your annual well check will depend on your age, overall health, lifestyle risk factors, and family history of disease. Counseling  Your health care provider may ask you questions about your: Alcohol use. Tobacco use. Drug use. Emotional well-being. Home and relationship well-being. Sexual activity. Eating habits. History of falls. Memory and ability to understand (cognition). Work and work Statistician. Screening  You may have the following tests or measurements: Height, weight, and BMI. Blood pressure. Lipid and cholesterol levels. These may be checked every 5 years, or more frequently if you are over 22 years old. Skin check. Lung cancer screening. You may have this screening every year starting at age 64 if you have a 30-pack-year history of smoking and currently smoke or have quit within the past 15 years. Fecal occult blood test (FOBT) of the stool. You may have this test every year starting at  age 22. Flexible sigmoidoscopy or colonoscopy. You may have a sigmoidoscopy every 5 years or a colonoscopy every 10 years starting at age 6. Prostate cancer screening. Recommendations will vary depending on your family history and other risks. Hepatitis C blood test. Hepatitis B blood test. Sexually transmitted disease (STD) testing. Diabetes screening. This is done by checking your blood sugar (glucose) after you have not eaten for a while (fasting). You may have this done every 1-3  years. Abdominal aortic aneurysm (AAA) screening. You may need this if you are a current or former smoker. Osteoporosis. You may be screened starting at age 75 if you are at high risk. Talk with your health care provider about your test results, treatment options, and if necessary, the need for more tests. Vaccines  Your health care provider may recommend certain vaccines, such as: Influenza vaccine. This is recommended every year. Tetanus, diphtheria, and acellular pertussis (Tdap, Td) vaccine. You may need a Td booster every 10 years. Zoster vaccine. You may need this after age 39. Pneumococcal 13-valent conjugate (PCV13) vaccine. One dose is recommended after age 52. Pneumococcal polysaccharide (PPSV23) vaccine. One dose is recommended after age 28. Talk to your health care provider about which screenings and vaccines you need and how often you need them. This information is not intended to replace advice given to you by your health care provider. Make sure you discuss any questions you have with your health care provider. Document Released: 04/23/2015 Document Revised: 12/15/2015 Document Reviewed: 01/26/2015 Elsevier Interactive Patient Education  2017 [employer] Prevention in the Home Falls can cause injuries. They can happen to people of all ages. There are many things you can do to make your home safe and to help prevent falls. What can I do on the outside of my home? Regularly fix the edges of walkways and driveways and fix any cracks. Remove anything that might make you trip as you walk through a door, such as a raised step or threshold. Trim any bushes or trees on the path to your home. Use bright outdoor lighting. Clear any walking paths of anything that might make someone trip, such as rocks or tools. Regularly check to see if handrails are loose or broken. Make sure that both sides of any steps have handrails. Any raised decks and porches should have guardrails on the  edges. Have any leaves, snow, or ice cleared regularly. Use sand or salt on walking paths during winter. Clean up any spills in your garage right away. This includes oil or grease spills. What can I do in the bathroom? Use night lights. Install grab bars by the toilet and in the tub and shower. Do not use towel bars as grab bars. Use non-skid mats or decals in the tub or shower. If you need to sit down in the shower, use a plastic, non-slip stool. Keep the floor dry. Clean up any water that spills on the floor as soon as it happens. Remove soap buildup in the tub or shower regularly. Attach bath mats securely with double-sided non-slip rug tape. Do not have throw rugs and other things on the floor that can make you trip. What can I do in the bedroom? Use night lights. Make sure that you have a light by your bed that is easy to reach. Do not use any sheets or blankets that are too big for your bed. They should not hang down onto the floor. Have a firm chair that has side arms. You can  use this for support while you get dressed. Do not have throw rugs and other things on the floor that can make you trip. What can I do in the kitchen? Clean up any spills right away. Avoid walking on wet floors. Keep items that you use a lot in easy-to-reach places. If you need to reach something above you, use a strong step stool that has a grab bar. Keep electrical cords out of the way. Do not use floor polish or wax that makes floors slippery. If you must use wax, use non-skid floor wax. Do not have throw rugs and other things on the floor that can make you trip. What can I do with my stairs? Do not leave any items on the stairs. Make sure that there are handrails on both sides of the stairs and use them. Fix handrails that are broken or loose. Make sure that handrails are as long as the stairways. Check any carpeting to make sure that it is firmly attached to the stairs. Fix any carpet that is loose or  worn. Avoid having throw rugs at the top or bottom of the stairs. If you do have throw rugs, attach them to the floor with carpet tape. Make sure that you have a light switch at the top of the stairs and the bottom of the stairs. If you do not have them, ask someone to add them for you. What else can I do to help prevent falls? Wear shoes that: Do not have high heels. Have rubber bottoms. Are comfortable and fit you well. Are closed at the toe. Do not wear sandals. If you use a stepladder: Make sure that it is fully opened. Do not climb a closed stepladder. Make sure that both sides of the stepladder are locked into place. Ask someone to hold it for you, if possible. Clearly mark and make sure that you can see: Any grab bars or handrails. First and last steps. Where the edge of each step is. Use tools that help you move around (mobility aids) if they are needed. These include: Canes. Walkers. Scooters. Crutches. Turn on the lights when you go into a dark area. Replace any light bulbs as soon as they burn out. Set up your furniture so you have a clear path. Avoid moving your furniture around. If any of your floors are uneven, fix them. If there are any pets around you, be aware of where they are. Review your medicines with your doctor. Some medicines can make you feel dizzy. This can increase your chance of falling. Ask your doctor what other things that you can do to help prevent falls. This information is not intended to replace advice given to you by your health care provider. Make sure you discuss any questions you have with your health care provider. Document Released: 01/21/2009 Document Revised: 09/02/2015 Document Reviewed: 05/01/2014 Elsevier Interactive Patient Education  2017 [employer].

## 2022-07-21 ENCOUNTER — Telehealth: Payer: Self-pay

## 2022-07-21 NOTE — Progress Notes (Signed)
Care Management & Coordination Services Pharmacy Note  07/24/2022 Name:  Gabriel Aguilar MRN:  [id number] DOB:  June 26, 1950  Summary: BP at goal <130/80 LDL at goal <70  Recommendations/Changes made from today's visit: -Check BP once every 1-2 weeks and keep a log. Continue to be mindful of salt content in food -Counseled to continue exercise to a goal of at least 142min/week and be mindful of fried, fatty foods in diet to keep cholesterol at goal  Follow up plan: Pharmacist visit in 8 months   Subjective: Gabriel Aguilar is an 72 y.o. year old male who is a primary patient of Nelwyn Salisbury, MD.  The care coordination team was consulted for assistance with disease management and care coordination needs.    Engaged with patient by telephone for follow up visit.  Recent office visits: 05/08/22 Theresa Mulligan, LPN - For AWV, no medication changes  Recent consult visits: None  Hospital visits: None in previous 6 months   Objective:  Lab Results  Component Value Date   CREATININE 1.14 08/08/2021   BUN 20 08/08/2021   GFR 64.16 08/08/2021   GFRNONAA >60 03/23/2021   GFRAA >60 05/30/2018   NA 137 08/08/2021   K 3.9 08/08/2021   CALCIUM 9.8 08/08/2021   CO2 30 08/08/2021   GLUCOSE 101 (H) 08/08/2021    Lab Results  Component Value Date/Time   HGBA1C 5.7 08/08/2021 09:11 AM   HGBA1C 5.9 08/04/2020 09:14 AM   GFR 64.16 08/08/2021 09:11 AM   GFR 65.30 08/04/2020 09:14 AM    Last diabetic Eye exam: No results found for: "HMDIABEYEEXA"  Last diabetic Foot exam: No results found for: "HMDIABFOOTEX"   Lab Results  Component Value Date   CHOL 138 02/13/2022   HDL 49 02/13/2022   LDLCALC 69 02/13/2022   LDLDIRECT 97.0 05/14/2018   TRIG 111 02/13/2022   CHOLHDL 2.8 02/13/2022       Latest Ref Rng & Units 08/08/2021    9:11 AM 03/23/2021   11:46 AM 08/04/2020    9:14 AM  Hepatic Function  Total Protein 6.0 - 8.3 g/dL 7.4  7.5  7.1   Albumin 3.5 - 5.2 g/dL 4.6  4.5  4.5    AST 0 - 37 U/L 60  27  42   ALT 0 - 53 U/L 41  30  43   Alk Phosphatase 39 - 117 U/L 66  49  56   Total Bilirubin 0.2 - 1.2 mg/dL 0.7  0.9  0.7   Bilirubin, Direct 0.0 - 0.3 mg/dL 0.2   0.1     Lab Results  Component Value Date/Time   TSH 0.78 08/08/2021 09:11 AM   TSH 0.69 08/04/2020 09:14 AM   FREET4 0.77 08/04/2020 09:14 AM       Latest Ref Rng & Units 08/08/2021    9:11 AM 03/23/2021   11:46 AM 08/04/2020    9:14 AM  CBC  WBC 4.0 - 10.5 K/uL 12.0  11.1  11.2   Hemoglobin 13.0 - 17.0 g/dL 78.2  95.6  21.3   Hematocrit 39.0 - 52.0 % 41.8  41.5  38.9   Platelets 150.0 - 400.0 K/uL 456.0  317  398.0     Lab Results  Component Value Date/Time   VITAMINB12 535 08/08/2021 09:11 AM    Clinical ASCVD: Yes  The ASCVD Risk score (Arnett DK, et al., 2019) failed to calculate for the following reasons:   The patient has a prior MI or stroke diagnosis  05/08/2022    3:19 PM 01/31/2022   10:42 AM 01/17/2022    9:28 AM  Depression screen PHQ 2/9  Decreased Interest 0 0 0  Down, Depressed, Hopeless 0 0 0  PHQ - 2 Score 0 0 0  Altered sleeping  0 0  Tired, decreased energy  1 0  Change in appetite  0 0  Feeling bad or failure about yourself   0 0  Trouble concentrating  0 0  Moving slowly or fidgety/restless  0 0  Suicidal thoughts  0 0  PHQ-9 Score  1 0  Difficult doing work/chores  Not difficult at all Not difficult at all     Social History   Tobacco Use  Smoking Status Former   Packs/day: 1.5   Types: Cigarettes   Start date: 04/10/1964   Quit date: 03/06/1983   Years since quitting: 39.4  Smokeless Tobacco Never   BP Readings from Last 3 Encounters:  01/31/22 124/70  01/17/22 118/64  11/16/21 124/70   Pulse Readings from Last 3 Encounters:  01/31/22 65  01/17/22 65  11/16/21 60   Wt Readings from Last 3 Encounters:  05/08/22 194 lb (88 kg)  01/31/22 194 lb (88 kg)  01/17/22 192 lb (87.1 kg)   BMI Readings from Last 3 Encounters:  05/08/22  29.50 kg/m  01/31/22 29.50 kg/m  01/17/22 29.19 kg/m    No Known Allergies  Medications Reviewed Today     Reviewed by Sherrill Raring, RPH (Pharmacist) on 07/24/22 at 0837  Med List Status: <None>   Medication Order Taking? Sig Documenting Provider Last Dose Status Informant  amLODipine (NORVASC) 10 MG tablet [id number] Yes TAKE ONE TABLET BY MOUTH ONCE DAILY Rollene Rotunda, MD Taking Active   Ascorbic Acid (VITAMIN C) 1000 MG tablet [id number] Yes Take 1,000 mg by mouth daily. [provider] Taking Active Self  b complex vitamins capsule [id number] Yes Take 1 capsule by mouth daily. [provider] Taking Active Self  Black Pepper-Turmeric (TURMERIC CURCUMIN) 08-998 MG CAPS [id number] Yes Take 1,000 mg by mouth daily. [provider] Taking Active   Cholecalciferol (VITAMIN D) 50 MCG (2000 UT) CAPS [id number] Yes Take 1 capsule by mouth daily. [provider] Taking Active   cholestyramine light (PREVALITE) 4 g packet [id number] No Take 1 packet (4 g total) by mouth 2 (two) times daily.  Patient not taking: Reported on 07/24/2022   Nelwyn Salisbury, MD Not Taking Active   clopidogrel (PLAVIX) 75 MG tablet [id number] Yes TAKE ONE TABLET BY MOUTH EVERY MORNING with breakfast Rollene Rotunda, MD Taking Active   dicyclomine (BENTYL) 20 MG tablet [id number] Yes Take 1 tablet (20 mg total) by mouth 2 (two) times daily. Theron Arista, PA-C Taking Active   fenofibrate 54 MG tablet [id number] Yes TAKE ONE TABLET BY MOUTH ONCE DAILY Rollene Rotunda, MD Taking Active   hydrochlorothiazide (HYDRODIURIL) 25 MG tablet [id number] Yes TAKE ONE TABLET BY MOUTH ONCE DAILY Rollene Rotunda, MD Taking Active   LORazepam (ATIVAN) 0.5 MG tablet [id number] Yes TAKE 1 TABLET BY MOUTH AT BEDTIME. Nelwyn Salisbury, MD Taking Active   magnesium oxide (MAG-OX) 400 (240 Mg) MG tablet [id number] Yes Take 400 mg by mouth daily. [provider] Taking Active   metoprolol tartrate  (LOPRESSOR) 25 MG tablet [id number]  Take 0.5 tablets (12.5 mg total) by mouth every 8 (eight) hours as needed (ELEVATED HEART RATE). Rollene Rotunda, MD  Expired 01/16/22 2359 Self  Med Note Merlene Morse, KIMBERLY L   Wed Mar 23, 2021  1:17 PM) prn  niacin 500 MG tablet [id number] Yes Take 500 mg by mouth at bedtime. [provider] Taking Active Self  nitroGLYCERIN (NITROSTAT) 0.4 MG SL tablet [id number]  Place 1 tablet (0.4 mg total) under the tongue every 5 (five) minutes as needed for chest pain. Rollene Rotunda, MD  Active Self           Med Note Penni Bombard   Wed Mar 23, 2021  1:17 PM) Has on hand  omeprazole (PRILOSEC) 40 MG capsule [id number] Yes TAKE ONE CAPSULE BY MOUTH ONCE DAILY Rollene Rotunda, MD Taking Active   rosuvastatin (CRESTOR) 40 MG tablet [id number] Yes Take 1 tablet (40 mg total) by mouth daily. Rollene Rotunda, MD Taking Active   triamcinolone cream (KENALOG) 0.1 % [id number] Yes Apply topically 2 (two) times daily. [provider] Taking Active   XARELTO 20 MG TABS tablet [id number] Yes TAKE ONE TABLET BY MOUTH DAILY with SUPPER Rollene Rotunda, MD Taking Active   zinc gluconate 50 MG tablet [id number] Yes Take 50 mg by mouth daily. [provider] Taking Active             SDOH:  (Social Determinants of Health) assessments and interventions performed: Yes SDOH Interventions    Flowsheet Row Care Coordination from 07/24/2022 in [facility] Clinical Support from 05/08/2022 in [facility] [place] Chronic Care Management from 01/16/2022 in [facility] at [place] Clinical Support from 03/17/2020 in [facility] [place] Chronic Care Management from 08/14/2019 in [facility] [place] [facility] [place] Clinical Support from 05/13/2018 in [facility] at [place]  SDOH Interventions        Food Insecurity Interventions Intervention Not  Indicated Intervention Not Indicated -- Intervention Not Indicated -- --  Housing Interventions Intervention Not Indicated Intervention Not Indicated -- Intervention Not Indicated -- --  Transportation Interventions -- Intervention Not Indicated -- Intervention Not Indicated -- --  Alcohol Usage Interventions -- Intervention Not Indicated (Score <7) -- -- -- --  Depression Interventions/Treatment  -- -- -- PHQ2-9 Score <4 Follow-up Not Indicated -- PHQ2-9 Score <4 Follow-up Not Indicated  Financial Strain Interventions -- Intervention Not Indicated Intervention Not Indicated Intervention Not Indicated Other (Comment)  [Not needed] --  Physical Activity Interventions -- Intervention Not Indicated -- Intervention Not Indicated -- --  Stress Interventions -- Intervention Not Indicated -- Intervention Not Indicated -- --  Social Connections Interventions -- Intervention Not Indicated -- Intervention Not Indicated -- --       Medication Assistance: None required.  Patient affirms current coverage meets needs.  Medication Access: Within the past 30 days, how often has patient missed a dose of medication? None Is a pillbox or other method used to improve adherence? Yes  Factors that may affect medication adherence? hearing impairment Are meds synced by current pharmacy? No Are meds delivered by current pharmacy? No Does patient experience delays in picking up medications due to transportation concerns? No   [facility] Reviewed: Is patient disadvantaged to use [facility]?: No  Current Rx insurance plan: HTA Name and location of Current pharmacy:  [facility] - [place], [place] - [address] [place] [address] Phone: [phone] Fax: [phone]  [id number] [place], [place] - [address] [place] [address] Phone: [phone] Fax: [phone]  [facility]  services  reviewed with patient today?: No  Patient requests to transfer care to [facility]?: No  Reason patient declined to change pharmacies: Patient was previously enrolled and unenrolled  Compliance/Adherence/Medication fill history: Care Gaps: Foot Exam - Overdue -  not diabetic Eye Exam - Overdue -  not diabetic Diabetic Urine - Overdue - not diabetic A1C - Overdue - not diabetic Zoster Vaccine - Postponed AWV - 05/08/22 - next scheduled 05/2023  Star-Rating Drugs: Rosuvastatin 40 mg - [facility]   Assessment/Plan   Hypertension (BP goal <130/80) -Controlled -Current treatment: Amlodipine 10mg  1qd Appropriate, Effective, Safe, Accessible HCTZ 25mg  1 qam Appropriate, Effective, Safe, Accessible Metoprolol tartrate 25mg  1/2 tab q8h prn for elevated Appropriate, Effective, Safe, Accessible -Medications previously tried: Carvedilol  -Current home readings: not checking at home  -Current dietary habits: mindful of salt intake -Current exercise habits: goes to gym daily or goes on walk -Denies hypotensive/hypertensive symptoms -Educated on BP goals and benefits of medications for prevention of heart attack, stroke and kidney damage; Daily salt intake goal < 2300 mg; Exercise goal of 150 minutes per week; Importance of home blood pressure monitoring; Proper BP monitoring technique; -Counseled to monitor BP at home once weekly, document, and provide log at future appointments -Recommended to continue current medication  Hyperlipidemia: (LDL goal < 70) -Controlled -Current treatment: Rosuvastatin 40mg  1 qd Appropriate, Effective, Safe, Accessible Fenofibrate 54mg  1 qd Appropriate, Effective, Safe, Accessible Niacin 500mg  1 qhs Appropriate, Effective, Safe, Accessible -Medications previously tried: Omega Fish oil, Atorvastatin -Current dietary patterns: Limits fried, fatty foods -Current exercise habits: see above -Educated on Cholesterol goals;  Benefits  of statin for ASCVD risk reduction; Importance of limiting foods high in cholesterol; Exercise goal of 150 minutes per week; -Recommended to continue current medication  Sherrill Raring Clinical Pharmacist [phone]

## 2022-07-21 NOTE — Progress Notes (Signed)
Patient ID: Gabriel Aguilar, male   DOB: [birth year], 72 y.o.   MRN: [id number]  Care Management & Coordination Services Pharmacy Team  Reason for Encounter: Appointment Reminder  Contacted patient to confirm telephone appointment with Milas Kocher, PharmD on 07/24/22 at 8:30. Unsuccessful outreach. Left voicemail for patient to return call.    Star Rating Drugs:  Rosuvastatin 40 mg - [facility]    Care Gaps: Foot Exam - Overdue Eye Exam - Overdue Diabetic Urine - Overdue A1C - Overdue Zoster Vaccine - Postponed AWV - 05/08/22    Pamala Duffel CMA Clinical Pharmacist Assistant [phone]

## 2022-07-24 ENCOUNTER — Ambulatory Visit: Payer: PPO

## 2022-07-28 ENCOUNTER — Emergency Department (HOSPITAL_COMMUNITY): Payer: PPO

## 2022-07-28 ENCOUNTER — Other Ambulatory Visit: Payer: Self-pay

## 2022-07-28 ENCOUNTER — Emergency Department (HOSPITAL_COMMUNITY)
Admission: EM | Admit: 2022-07-28 | Discharge: 2022-07-28 | Disposition: A | Discharge status: Home / Self Care | Payer: PPO | Attending: Emergency Medicine | Admitting: Emergency Medicine

## 2022-07-28 ENCOUNTER — Encounter (HOSPITAL_COMMUNITY): Payer: Self-pay

## 2022-07-28 DIAGNOSIS — S79911A Unspecified injury of right hip, initial encounter: Secondary | ICD-10-CM | POA: Diagnosis present

## 2022-07-28 DIAGNOSIS — Y9302 Activity, running: Secondary | ICD-10-CM | POA: Diagnosis not present

## 2022-07-28 DIAGNOSIS — M79651 Pain in right thigh: Secondary | ICD-10-CM | POA: Diagnosis not present

## 2022-07-28 DIAGNOSIS — I4891 Unspecified atrial fibrillation: Secondary | ICD-10-CM | POA: Diagnosis not present

## 2022-07-28 DIAGNOSIS — S0990XA Unspecified injury of head, initial encounter: Secondary | ICD-10-CM | POA: Diagnosis not present

## 2022-07-28 DIAGNOSIS — W108XXA Fall (on) (from) other stairs and steps, initial encounter: Secondary | ICD-10-CM | POA: Insufficient documentation

## 2022-07-28 DIAGNOSIS — Z7901 Long term (current) use of anticoagulants: Secondary | ICD-10-CM | POA: Insufficient documentation

## 2022-07-28 DIAGNOSIS — M25551 Pain in right hip: Secondary | ICD-10-CM | POA: Insufficient documentation

## 2022-07-28 DIAGNOSIS — W19XXXA Unspecified fall, initial encounter: Secondary | ICD-10-CM | POA: Diagnosis not present

## 2022-07-28 DIAGNOSIS — S7001XA Contusion of right hip, initial encounter: Secondary | ICD-10-CM | POA: Insufficient documentation

## 2022-07-28 DIAGNOSIS — I2510 Atherosclerotic heart disease of native coronary artery without angina pectoris: Secondary | ICD-10-CM | POA: Diagnosis not present

## 2022-07-28 DIAGNOSIS — I10 Essential (primary) hypertension: Secondary | ICD-10-CM | POA: Diagnosis not present

## 2022-07-28 DIAGNOSIS — R102 Pelvic and perineal pain: Secondary | ICD-10-CM | POA: Diagnosis not present

## 2022-07-28 DIAGNOSIS — K429 Umbilical hernia without obstruction or gangrene: Secondary | ICD-10-CM | POA: Diagnosis not present

## 2022-07-28 DIAGNOSIS — M545 Low back pain, unspecified: Secondary | ICD-10-CM | POA: Diagnosis not present

## 2022-07-28 DIAGNOSIS — N3289 Other specified disorders of bladder: Secondary | ICD-10-CM | POA: Diagnosis not present

## 2022-07-28 LAB — CBC WITH DIFFERENTIAL/PLATELET
Abs Immature Granulocytes: 0.07 10*3/uL (ref 0.00–0.07)
Basophils Absolute: 0.1 10*3/uL (ref 0.0–0.1)
Basophils Relative: 0 %
Eosinophils Absolute: 0.3 10*3/uL (ref 0.0–0.5)
Eosinophils Relative: 2 %
HCT: 38 % — ABNORMAL LOW (ref 39.0–52.0)
Hemoglobin: 13.1 g/dL (ref 13.0–17.0)
Immature Granulocytes: 1 %
Lymphocytes Relative: 40 %
Lymphs Abs: 5 10*3/uL — ABNORMAL HIGH (ref 0.7–4.0)
MCH: 31.1 pg (ref 26.0–34.0)
MCHC: 34.5 g/dL (ref 30.0–36.0)
MCV: 90.3 fL (ref 80.0–100.0)
Monocytes Absolute: 1 10*3/uL (ref 0.1–1.0)
Monocytes Relative: 8 %
Neutro Abs: 6.2 10*3/uL (ref 1.7–7.7)
Neutrophils Relative %: 49 %
Platelets: 360 10*3/uL (ref 150–400)
RBC: 4.21 MIL/uL — ABNORMAL LOW (ref 4.22–5.81)
RDW: 13.5 % (ref 11.5–15.5)
WBC: 12.6 10*3/uL — ABNORMAL HIGH (ref 4.0–10.5)
nRBC: 0 % (ref 0.0–0.2)

## 2022-07-28 LAB — BASIC METABOLIC PANEL
Anion gap: 9 (ref 5–15)
BUN: 31 mg/dL — ABNORMAL HIGH (ref 8–23)
CO2: 25 mmol/L (ref 22–32)
Calcium: 9.2 mg/dL (ref 8.9–10.3)
Chloride: 97 mmol/L — ABNORMAL LOW (ref 98–111)
Creatinine, Ser: 1.23 mg/dL (ref 0.61–1.24)
GFR, Estimated: >60 mL/min (ref >60)
Glucose, Bld: 140 mg/dL — ABNORMAL HIGH (ref 70–99)
Potassium: 2.8 mmol/L — ABNORMAL LOW (ref 3.5–5.1)
Sodium: 131 mmol/L — ABNORMAL LOW (ref 135–145)

## 2022-07-28 MED ORDER — FENTANYL CITRATE PF 50 MCG/ML IJ SOSY
50.0000 ug | PREFILLED_SYRINGE | Freq: Once | INTRAMUSCULAR | Status: AC
  Administered 2022-07-28: 50 ug via INTRAVENOUS
  Filled 2022-07-28: qty 1

## 2022-07-28 MED ORDER — OXYCODONE HCL 5 MG PO TABS: 5.0000 mg | ORAL_TABLET | Freq: Four times a day (QID) | ORAL | 0 refills | Status: AC | PRN

## 2022-07-28 MED ORDER — CYCLOBENZAPRINE HCL 10 MG PO TABS: 10.0000 mg | ORAL_TABLET | Freq: Two times a day (BID) | ORAL | 0 refills | Status: AC | PRN

## 2022-07-28 NOTE — ED Provider Notes (Signed)
[place] EMERGENCY DEPARTMENT AT [facility] Provider Note   CSN: [id number] Arrival date & time: 07/28/22  2000     History  Chief Complaint  Patient presents with   Gabriel Aguilar is a 71 y.o. male.  Patient here with right hip pain after a fall while running up a flight of stairs.  Landed on his right hip pretty hard.  Has been unable to bear weight since.  He is on Xarelto for A-fib.  He did not hit his head or lost consciousness.  He is not having any back pain or other extremity pain.  Happened just prior to arrival.  He has a history of CAD as well.  The history is provided by the patient.       Home Medications Prior to Admission medications   Medication Sig Start Date End Date Taking? Authorizing Provider  cyclobenzaprine (FLEXERIL) 10 MG tablet Take 1 tablet (10 mg total) by mouth 2 (two) times daily as needed for muscle spasms. 07/28/22  Yes Jernie Schutt, DO  oxyCODONE (ROXICODONE) 5 MG immediate release tablet Take 1 tablet (5 mg total) by mouth every 6 (six) hours as needed for up to 10 doses for breakthrough pain. 07/28/22  Yes Samiel Peel, DO  amLODipine (NORVASC) 10 MG tablet TAKE ONE TABLET BY MOUTH ONCE DAILY 02/17/22   Rollene Rotunda, MD  Ascorbic Acid (VITAMIN C) 1000 MG tablet Take 1,000 mg by mouth daily.    [provider]  b complex vitamins capsule Take 1 capsule by mouth daily.    [provider]  Black Pepper-Turmeric (TURMERIC CURCUMIN) 08-998 MG CAPS Take 1,000 mg by mouth daily.    [provider]  Cholecalciferol (VITAMIN D) 50 MCG (2000 UT) CAPS Take 1 capsule by mouth daily.    [provider]  cholestyramine light (PREVALITE) 4 g packet Take 1 packet (4 g total) by mouth 2 (two) times daily. Patient not taking: Reported on 07/24/2022 01/31/22   Nelwyn Salisbury, MD  clopidogrel (PLAVIX) 75 MG tablet TAKE ONE TABLET BY MOUTH EVERY MORNING with breakfast 02/17/22   Rollene Rotunda, MD   dicyclomine (BENTYL) 20 MG tablet Take 1 tablet (20 mg total) by mouth 2 (two) times daily. 03/23/21   Theron Arista, PA-C  fenofibrate 54 MG tablet TAKE ONE TABLET BY MOUTH ONCE DAILY 02/17/22   Rollene Rotunda, MD  hydrochlorothiazide (HYDRODIURIL) 25 MG tablet TAKE ONE TABLET BY MOUTH ONCE DAILY 02/17/22   Rollene Rotunda, MD  LORazepam (ATIVAN) 0.5 MG tablet TAKE 1 TABLET BY MOUTH AT BEDTIME. 03/23/22   Nelwyn Salisbury, MD  magnesium oxide (MAG-OX) 400 (240 Mg) MG tablet Take 400 mg by mouth daily.    [provider]  metoprolol tartrate (LOPRESSOR) 25 MG tablet Take 0.5 tablets (12.5 mg total) by mouth every 8 (eight) hours as needed (ELEVATED HEART RATE). 02/22/21 01/16/22  Rollene Rotunda, MD  niacin 500 MG tablet Take 500 mg by mouth at bedtime.    [provider]  nitroGLYCERIN (NITROSTAT) 0.4 MG SL tablet Place 1 tablet (0.4 mg total) under the tongue every 5 (five) minutes as needed for chest pain. 08/14/19   Rollene Rotunda, MD  omeprazole (PRILOSEC) 40 MG capsule TAKE ONE CAPSULE BY MOUTH ONCE DAILY 11/18/21   Rollene Rotunda, MD  rosuvastatin (CRESTOR) 40 MG tablet Take 1 tablet (40 mg total) by mouth daily. 09/01/21   Rollene Rotunda, MD  triamcinolone cream (KENALOG) 0.1 % Apply topically 2 (  two) times daily. 04/07/21   [provider]  XARELTO 20 MG TABS tablet TAKE ONE TABLET BY MOUTH DAILY with SUPPER 02/17/22   Rollene Rotunda, MD  zinc gluconate 50 MG tablet Take 50 mg by mouth daily.    [provider]      Allergies    Patient has no known allergies.    Review of Systems   Review of Systems  Physical Exam Updated Vital Signs BP 125/62   Pulse (!) 57   Temp 97.6 F (36.4 C) (Oral)   Resp 15   Ht  (1.727 m)   Wt 89.8 kg   SpO2 96%   BMI 30.11 kg/m  Physical Exam Vitals and nursing note reviewed.  Constitutional:      General: He is not in acute distress.    Appearance: He is well-developed. He is not ill-appearing.  HENT:      Head: Normocephalic and atraumatic.     Nose: Nose normal.     Mouth/Throat:     Mouth: Mucous membranes are moist.  Eyes:     Extraocular Movements: Extraocular movements intact.     Conjunctiva/sclera: Conjunctivae normal.     Pupils: Pupils are equal, round, and reactive to light.  Cardiovascular:     Rate and Rhythm: Normal rate and regular rhythm.     Pulses: Normal pulses.     Heart sounds: No murmur heard. Pulmonary:     Effort: Pulmonary effort is normal. No respiratory distress.     Breath sounds: Normal breath sounds.  Abdominal:     Palpations: Abdomen is soft.     Tenderness: There is no abdominal tenderness.  Musculoskeletal:        General: Tenderness present. No swelling.     Cervical back: Normal range of motion and neck supple. No tenderness.     Comments: Tenderness to the right hip/thigh  Skin:    General: Skin is warm and dry.     Capillary Refill: Capillary refill takes less than 2 seconds.  Neurological:     General: No focal deficit present.     Mental Status: He is alert and oriented to person, place, and time.     Cranial Nerves: No cranial nerve deficit.     Sensory: No sensory deficit.     Motor: No weakness.     Coordination: Coordination normal.  Psychiatric:        Mood and Affect: Mood normal.     ED Results / Procedures / Treatments   Labs (all labs ordered are listed, but only abnormal results are displayed) Labs Reviewed  CBC WITH DIFFERENTIAL/PLATELET - Abnormal; Notable for the following components:      Result Value   WBC 12.6 (*)    RBC 4.21 (*)    HCT 38.0 (*)    Lymphs Abs 5.0 (*)    All other components within normal limits  BASIC METABOLIC PANEL - Abnormal; Notable for the following components:   Sodium 131 (*)    Potassium 2.8 (*)    Chloride 97 (*)    Glucose, Bld 140 (*)    BUN 31 (*)    All other components within normal limits    EKG EKG Interpretation  Date/Time:  Friday July 28 2022 21:07:27  EDT Ventricular Rate:  60 PR Interval:  224 QRS Duration: 97 QT Interval:  454 QTC Calculation: 454 R Axis:   17 Text Interpretation: Sinus rhythm Prolonged PR interval Abnormal R-wave progression, early transition  Confirmed by Virgina Norfolk [id number] on 07/28/2022 9:10:14 PM  Radiology CT PELVIS WO CONTRAST  Result Date: 07/28/2022 CLINICAL DATA:  Hip trauma.  Fracture suspected. EXAM: CT PELVIS WITHOUT CONTRAST TECHNIQUE: Multidetector CT imaging of the pelvis was performed following the standard protocol without intravenous contrast. RADIATION DOSE REDUCTION: This exam was performed according to the departmental dose-optimization program which includes automated exposure control, adjustment of the mA and/or kV according to patient size and/or use of iterative reconstruction technique. COMPARISON:  AP pelvis and AP and frog-leg right hip today, CT abdomen and pelvis and reconstructions 03/23/2021. FINDINGS: Urinary Tract: The distal ureters are small in caliber. No distal ureteral stone is seen. The bladder is distended with the dome reaching as high as L4-5, without wall thickening or stones. The bladder base is mildly elevated by the enlarged prostate. Pelvic bowel: No dilatation or wall thickening. An appendix is not seen in this patient. Vascular/Lymphatic: Scattered aortoiliac calcific plaque. No aneurysm is seen. The abdominal aorta is only partially imaged. No pelvic or inguinal adenopathy. Reproductive: Significantly enlarged prostate. 6.2 cm transverse axis. Previously 6.5 cm. Both testicles are in the scrotal sac. Other: Small umbilical fat hernia. No incarcerated hernia. Small left inguinal fat hernia. No free air, free hemorrhage or free fluid. Pelvic phleboliths both sides. Musculoskeletal: There is osteopenia with degenerative changes of L4-5 and L5-S1, mild degenerative arthrosis of the hips and enthesopathic changes of the pelvis and femoral trochanters. There is mild vacuum phenomenon and  spurring of both SI joints. Evidence of fractures is not seen. No primary pathologic bone lesion is evident. IMPRESSION: 1. Osteopenia and degenerative change without evidence of fractures. 2. Distended urinary bladder, with bladder base elevated by the enlarged prostate. 3. Prostatomegaly. 4. Umbilical and left inguinal fat hernias. 5. Aortic atherosclerosis. Aortic Atherosclerosis (ICD10-I70.0). Electronically Signed   By: Almira Bar M.D.   On: 07/28/2022 22:10   CT Head Wo Contrast  Result Date: 07/28/2022 CLINICAL DATA:  Head trauma EXAM: CT HEAD WITHOUT CONTRAST TECHNIQUE: Contiguous axial images were obtained from the base of the skull through the vertex without intravenous contrast. RADIATION DOSE REDUCTION: This exam was performed according to the departmental dose-optimization program which includes automated exposure control, adjustment of the mA and/or kV according to patient size and/or use of iterative reconstruction technique. COMPARISON:  None Available. FINDINGS: Brain: There is no mass, hemorrhage or extra-axial collection. The size and configuration of the ventricles and extra-axial CSF spaces are normal. The brain parenchyma is normal, without acute or chronic infarction. Vascular: No abnormal hyperdensity of the major intracranial arteries or dural venous sinuses. No intracranial atherosclerosis. Skull: The visualized skull base, calvarium and extracranial soft tissues are normal. Sinuses/Orbits: No fluid levels or advanced mucosal thickening of the visualized paranasal sinuses. No mastoid or middle ear effusion. The orbits are normal. IMPRESSION: Normal head CT. Electronically Signed   By: Deatra Robinson M.D.   On: 07/28/2022 21:57   DG Lumbar Spine Complete  Result Date: 07/28/2022 CLINICAL DATA:  Fall, back pain, right hip pain EXAM: LUMBAR SPINE - COMPLETE 4+ VIEW COMPARISON:  None Available. FINDINGS: Normal lumbar lordosis. No acute fracture or listhesis of the lumbar spine.  Vertebral body height is preserved. Endplate remodeling throughout the lumbar spine and intervertebral disc space narrowing at L4-S1 is in keeping with changes of mild to moderate degenerative disc disease. Paraspinal soft tissues are unremarkable. IMPRESSION: 1. No acute fracture or listhesis. 2. 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 Electronically Signed  By: Helyn Numbers M.D.   On: 07/28/2022 21:11   DG Hip Unilat With Pelvis 2-3 Views Right  Result Date: 07/28/2022 CLINICAL DATA:  Pelvic pain EXAM: DG HIP (WITH OR WITHOUT PELVIS) 2-3V RIGHT COMPARISON:  None Available. FINDINGS: There is no evidence of hip fracture or dislocation. There is no evidence of arthropathy or other focal bone abnormality. IMPRESSION: Negative. Electronically Signed   By: Helyn Numbers M.D.   On: 07/28/2022 21:08    Procedures Procedures    Medications Ordered in ED Medications  fentaNYL (SUBLIMAZE) injection 50 mcg (50 mcg Intravenous Given 07/28/22 2028)    ED Course/ Medical Decision Making/ A&P                             Medical Decision Making Amount and/or Complexity of Data Reviewed Labs: ordered. Radiology: ordered.  Risk Prescription drug management.   Gabriel Aguilar is here with right hip pain after mechanical fall prior to arrival.  He has A-fib on Xarelto.  He is companied and hit his head.  However distracting injury.  Will get a CT scan of his head, right hip x-ray, basic labs in case he broke his hip.  He was ambulatory with support with EMS but was not bearing much weight.  He has no pain elsewhere.  No midline spinal pain.  No neck pain.  Very well-appearing.  Basic labs are unremarkable per my review and interpretation.  X-ray per radiology report with no fracture.  Lumbar x-ray also unremarkable.  Ended up getting a CT scan of his hip as well and per radiology report there is no acute finding in the CT of his head or CT of his hip.  There is no hip fracture.   He was able to ambulate very well with a walker.  I suspect contusion or some other soft tissue injury.  Will prescribe Lexapro and Roxicodone and recommend Tylenol for pain as well.  Will have him follow-up with orthopedics.  Discharged in good condition.  Understands return precautions.  This chart was dictated using voice recognition software.  Despite best efforts to proofread,  errors can occur which can change the documentation meaning.         Final Clinical Impression(s) / ED Diagnoses Final diagnoses:  Fall, initial encounter  Contusion of right hip, initial encounter    Rx / DC Orders ED Discharge Orders          Ordered    cyclobenzaprine (FLEXERIL) 10 MG tablet  2 times daily PRN        07/28/22 2206    oxyCODONE (ROXICODONE) 5 MG immediate release tablet  Every 6 hours PRN        07/28/22 2206              Virgina Norfolk, DO 07/28/22 2306

## 2022-07-28 NOTE — ED Triage Notes (Signed)
PER EMS: pt is from home, reports he lost his balance when running up some steps in a hurry tonight, states his foot did not hit the step correctly. He fell down and landed onto his right hip/groin area. He reports pain to right upper leg/groin. He was able to stand up and ambulate after the fall. He denies dizziness, denies head injury. He does take two blood thinners. Pt A&Ox4.   BP- 132/68, HR-76, O2-96% RA, CBG-87

## 2022-07-28 NOTE — ED Notes (Signed)
Pt ambulatory with walker to the bathroom. Pt tolerates ambulating with walker

## 2022-07-28 NOTE — ED Notes (Signed)
Pt to xray via stretcher

## 2022-07-28 NOTE — Discharge Instructions (Signed)
Recommend 1000 mg of Tylenol every 6 hours as needed for pain.  Take Flexeril as needed for muscle spasms.  Roxicodone is narcotic pain medicine for breakthrough pain.  Please be careful with both Flexeril and oxycodone as they are sedating.  Take as prescribed.  Follow-up with primary care doctor and orthopedic doctor.

## 2022-07-28 NOTE — ED Notes (Signed)
Pt to ct via stretcher

## 2022-08-01 ENCOUNTER — Telehealth: Payer: Self-pay

## 2022-08-01 NOTE — Telephone Encounter (Signed)
        Patient  visited [facility] on 07/28/2022  for fall.   Telephone encounter attempt :  1st  A HIPAA compliant voice message was left requesting a return call.  Instructed patient to call back at [phone].   Maisey Deandrade [facility]  [facility] Population Health Community Resource Care Guide   [email or web address]  ?? [phone]   Website: [email or web address]  [email or web address]

## 2022-08-02 ENCOUNTER — Telehealth: Payer: Self-pay

## 2022-08-02 DIAGNOSIS — M25551 Pain in right hip: Secondary | ICD-10-CM | POA: Diagnosis not present

## 2022-08-02 NOTE — Telephone Encounter (Signed)
        Patient  visited [facility] on 07/28/2022  for fall.   Telephone encounter attempt :  2nd  A HIPAA compliant voice message was left requesting a return call.  Instructed patient to call back at [phone].   Iman Orourke [facility]  [facility] Population Health Community Resource Care Guide   [email or web address]  ?? [phone]   Website: [email or web address]  [email or web address]

## 2022-08-03 ENCOUNTER — Emergency Department (HOSPITAL_COMMUNITY)
Admission: EM | Admit: 2022-08-03 | Discharge: 2022-08-04 | Disposition: A | Discharge status: Home / Self Care | Payer: PPO | Attending: Emergency Medicine | Admitting: Emergency Medicine

## 2022-08-03 ENCOUNTER — Emergency Department (HOSPITAL_COMMUNITY): Payer: PPO

## 2022-08-03 ENCOUNTER — Encounter (HOSPITAL_COMMUNITY): Payer: Self-pay

## 2022-08-03 DIAGNOSIS — S79911A Unspecified injury of right hip, initial encounter: Secondary | ICD-10-CM | POA: Diagnosis present

## 2022-08-03 DIAGNOSIS — S7011XA Contusion of right thigh, initial encounter: Secondary | ICD-10-CM

## 2022-08-03 DIAGNOSIS — M4807 Spinal stenosis, lumbosacral region: Secondary | ICD-10-CM | POA: Diagnosis not present

## 2022-08-03 DIAGNOSIS — M545 Low back pain, unspecified: Secondary | ICD-10-CM | POA: Diagnosis not present

## 2022-08-03 DIAGNOSIS — M25551 Pain in right hip: Secondary | ICD-10-CM | POA: Diagnosis not present

## 2022-08-03 DIAGNOSIS — Z7901 Long term (current) use of anticoagulants: Secondary | ICD-10-CM | POA: Diagnosis not present

## 2022-08-03 DIAGNOSIS — S300XXA Contusion of lower back and pelvis, initial encounter: Secondary | ICD-10-CM | POA: Diagnosis not present

## 2022-08-03 DIAGNOSIS — M48061 Spinal stenosis, lumbar region without neurogenic claudication: Secondary | ICD-10-CM | POA: Diagnosis not present

## 2022-08-03 DIAGNOSIS — W19XXXA Unspecified fall, initial encounter: Secondary | ICD-10-CM | POA: Insufficient documentation

## 2022-08-03 DIAGNOSIS — S7001XA Contusion of right hip, initial encounter: Secondary | ICD-10-CM | POA: Diagnosis not present

## 2022-08-03 NOTE — ED Triage Notes (Addendum)
Pt arrived POV for right hip pain that radiates into his back after re-injury after getting into a car. Pt was seen 4/19 for the initial fall, denies hip fracture. Pt was prescribe oxycodone and muscle relaxer. Pt followed by PT, reports was doing well until getting into the car today and turned wrong. No new falls. A&O x4. Pt reports painful to apply weight to right leg, difficult standing, reports painful to sit. Pt request cortisone injection for pain.

## 2022-08-04 ENCOUNTER — Encounter (HOSPITAL_COMMUNITY): Payer: Self-pay | Admitting: Radiology

## 2022-08-04 ENCOUNTER — Emergency Department (HOSPITAL_COMMUNITY): Payer: PPO

## 2022-08-04 DIAGNOSIS — M545 Low back pain, unspecified: Secondary | ICD-10-CM | POA: Diagnosis not present

## 2022-08-04 DIAGNOSIS — M48061 Spinal stenosis, lumbar region without neurogenic claudication: Secondary | ICD-10-CM | POA: Diagnosis not present

## 2022-08-04 DIAGNOSIS — M4807 Spinal stenosis, lumbosacral region: Secondary | ICD-10-CM | POA: Diagnosis not present

## 2022-08-04 LAB — CBC WITH DIFFERENTIAL/PLATELET
Abs Immature Granulocytes: 0.07 10*3/uL (ref 0.00–0.07)
Basophils Absolute: 0 10*3/uL (ref 0.0–0.1)
Basophils Relative: 0 %
Eosinophils Absolute: 0 10*3/uL (ref 0.0–0.5)
Eosinophils Relative: 0 %
HCT: 35.4 % — ABNORMAL LOW (ref 39.0–52.0)
Hemoglobin: 12.4 g/dL — ABNORMAL LOW (ref 13.0–17.0)
Immature Granulocytes: 0 %
Lymphocytes Relative: 12 %
Lymphs Abs: 2.1 10*3/uL (ref 0.7–4.0)
MCH: 31.6 pg (ref 26.0–34.0)
MCHC: 35 g/dL (ref 30.0–36.0)
MCV: 90.3 fL (ref 80.0–100.0)
Monocytes Absolute: 0.9 10*3/uL (ref 0.1–1.0)
Monocytes Relative: 5 %
Neutro Abs: 14.5 10*3/uL — ABNORMAL HIGH (ref 1.7–7.7)
Neutrophils Relative %: 83 %
Platelets: 446 10*3/uL — ABNORMAL HIGH (ref 150–400)
RBC: 3.92 MIL/uL — ABNORMAL LOW (ref 4.22–5.81)
RDW: 13.2 % (ref 11.5–15.5)
WBC: 17.5 10*3/uL — ABNORMAL HIGH (ref 4.0–10.5)
nRBC: 0 % (ref 0.0–0.2)

## 2022-08-04 MED ORDER — METHYLPREDNISOLONE 4 MG PO TBPK: ORAL_TABLET | ORAL | 0 refills | Status: DC

## 2022-08-04 MED ORDER — DICLOFENAC SODIUM 1 % EX GEL: 4.0000 g | Freq: Four times a day (QID) | CUTANEOUS | 0 refills | Status: DC

## 2022-08-04 MED ORDER — METHYLPREDNISOLONE 4 MG PO TBPK: ORAL_TABLET | ORAL | 0 refills | Status: AC

## 2022-08-04 MED ORDER — DIAZEPAM 2 MG PO TABS
2.0000 mg | ORAL_TABLET | Freq: Once | ORAL | Status: AC
  Administered 2022-08-04: 2 mg via ORAL
  Filled 2022-08-04: qty 1

## 2022-08-04 MED ORDER — OXYCODONE HCL 5 MG PO TABS
5.0000 mg | ORAL_TABLET | Freq: Once | ORAL | Status: AC
  Administered 2022-08-04: 5 mg via ORAL
  Filled 2022-08-04: qty 1

## 2022-08-04 MED ORDER — DICLOFENAC SODIUM 1 % EX GEL: 4.0000 g | Freq: Four times a day (QID) | CUTANEOUS | 0 refills | Status: AC

## 2022-08-04 MED ORDER — ACETAMINOPHEN 500 MG PO TABS
1000.0000 mg | ORAL_TABLET | Freq: Once | ORAL | Status: AC
  Administered 2022-08-04: 1000 mg via ORAL
  Filled 2022-08-04: qty 2

## 2022-08-04 MED ORDER — KETOROLAC TROMETHAMINE 15 MG/ML IJ SOLN
15.0000 mg | Freq: Once | INTRAMUSCULAR | Status: AC
  Administered 2022-08-04: 15 mg via INTRAMUSCULAR
  Filled 2022-08-04: qty 1

## 2022-08-04 NOTE — ED Provider Notes (Signed)
[facility] AT [facility] Provider Note   CSN: [id number] Arrival date & time: 08/03/22  2257     History  Chief Complaint  Patient presents with   Marletta Lor    Gabriel Aguilar is a 72 y.o. male.  72 yo M with a chief complaints of right sided low back pain that radiates to the leg.  This been ongoing for about a week.  And it was really severe after he fell about a week ago and then had improved.  He was trying to get into his car today and had sudden worsening.  He has seen orthopedics and was told they thought his hip was fine.  He has been taking muscle relaxants and narcotics without significant improvement.  He denies loss of bowel or bladder denies loss of rectal sensation denies numbness or weakness to the leg.   Fall       Home Medications Prior to Admission medications   Medication Sig Start Date End Date Taking? Authorizing Provider  diclofenac Sodium (VOLTAREN) 1 % GEL Apply 4 g topically 4 (four) times daily. 08/04/22  Yes Melene Plan, DO  methylPREDNISolone (MEDROL DOSEPAK) 4 MG TBPK tablet Day 1: 8mg  before breakfast, 4 mg after lunch, 4 mg after supper, and 8 mg at bedtime Day 2: 4 mg before breakfast, 4 mg after lunch, 4 mg  after supper, and 8 mg  at bedtime Day 3:  4 mg  before breakfast, 4 mg  after lunch, 4 mg after supper, and 4 mg  at bedtime Day 4: 4 mg  before breakfast, 4 mg  after lunch, and 4 mg at bedtime Day 5: 4 mg  before breakfast and 4 mg at bedtime Day 6: 4 mg  before breakfast 08/04/22  Yes Melene Plan, DO  amLODipine (NORVASC) 10 MG tablet TAKE ONE TABLET BY MOUTH ONCE DAILY 02/17/22   Rollene Rotunda, MD  Ascorbic Acid (VITAMIN C) 1000 MG tablet Take 1,000 mg by mouth daily.    [provider]  b complex vitamins capsule Take 1 capsule by mouth daily.    [provider]  Black Pepper-Turmeric (TURMERIC CURCUMIN) 08-998 MG CAPS Take 1,000 mg by mouth daily.    [provider]  Cholecalciferol  (VITAMIN D) 50 MCG (2000 UT) CAPS Take 1 capsule by mouth daily.    [provider]  cholestyramine light (PREVALITE) 4 g packet Take 1 packet (4 g total) by mouth 2 (two) times daily. Patient not taking: Reported on 07/24/2022 01/31/22   Nelwyn Salisbury, MD  clopidogrel (PLAVIX) 75 MG tablet TAKE ONE TABLET BY MOUTH EVERY MORNING with breakfast 02/17/22   Rollene Rotunda, MD  cyclobenzaprine (FLEXERIL) 10 MG tablet Take 1 tablet (10 mg total) by mouth 2 (two) times daily as needed for muscle spasms. 07/28/22   Curatolo, Adam, DO  dicyclomine (BENTYL) 20 MG tablet Take 1 tablet (20 mg total) by mouth 2 (two) times daily. 03/23/21   Theron Arista, PA-C  fenofibrate 54 MG tablet TAKE ONE TABLET BY MOUTH ONCE DAILY 02/17/22   Rollene Rotunda, MD  hydrochlorothiazide (HYDRODIURIL) 25 MG tablet TAKE ONE TABLET BY MOUTH ONCE DAILY 02/17/22   Rollene Rotunda, MD  LORazepam (ATIVAN) 0.5 MG tablet TAKE 1 TABLET BY MOUTH AT BEDTIME. 03/23/22   Nelwyn Salisbury, MD  magnesium oxide (MAG-OX) 400 (240 Mg) MG tablet Take 400 mg by mouth daily.    [provider]  metoprolol tartrate (LOPRESSOR) 25 MG tablet Take 0.5  tablets (12.5 mg total) by mouth every 8 (eight) hours as needed (ELEVATED HEART RATE). 02/22/21 01/16/22  Rollene Rotunda, MD  niacin 500 MG tablet Take 500 mg by mouth at bedtime.    [provider]  nitroGLYCERIN (NITROSTAT) 0.4 MG SL tablet Place 1 tablet (0.4 mg total) under the tongue every 5 (five) minutes as needed for chest pain. 08/14/19   Rollene Rotunda, MD  omeprazole (PRILOSEC) 40 MG capsule TAKE ONE CAPSULE BY MOUTH ONCE DAILY 11/18/21   Rollene Rotunda, MD  oxyCODONE (ROXICODONE) 5 MG immediate release tablet Take 1 tablet (5 mg total) by mouth every 6 (six) hours as needed for up to 10 doses for breakthrough pain. 07/28/22   Curatolo, Adam, DO  rosuvastatin (CRESTOR) 40 MG tablet Take 1 tablet (40 mg total) by mouth daily. 09/01/21   Rollene Rotunda, MD  triamcinolone  cream (KENALOG) 0.1 % Apply topically 2 (two) times daily. 04/07/21   [provider]  XARELTO 20 MG TABS tablet TAKE ONE TABLET BY MOUTH DAILY with SUPPER 02/17/22   Rollene Rotunda, MD  zinc gluconate 50 MG tablet Take 50 mg by mouth daily.    [provider]      Allergies    Patient has no known allergies.    Review of Systems   Review of Systems  Physical Exam Updated Vital Signs BP (!) 119/104 (BP Location: Right Arm)   Pulse 68   Temp 97.6 F (36.4 C) (Oral)   Resp (!) 21   Ht 5\' 8"  (1.727 m)   Wt 89.8 kg   BMI 30.11 kg/m  Physical Exam Vitals and nursing note reviewed.  Constitutional:      Appearance: He is well-developed.  HENT:     Head: Normocephalic and atraumatic.  Eyes:     Pupils: Pupils are equal, round, and reactive to light.  Neck:     Vascular: No JVD.  Cardiovascular:     Rate and Rhythm: Normal rate and regular rhythm.     Heart sounds: No murmur heard.    No friction rub. No gallop.  Pulmonary:     Effort: No respiratory distress.     Breath sounds: No wheezing.  Abdominal:     General: There is no distension.     Tenderness: There is no abdominal tenderness. There is no guarding or rebound.  Musculoskeletal:        General: Normal range of motion.     Cervical back: Normal range of motion and neck supple.     Comments: Pulse motor and sensation intact of the right lower extremity.  Reflexes are 2+ and equal.  There is no clonus.  Patient points to the right groin as area of discomfort.  No obvious mass or bulge.  Able to internally and externally rotate the right lower extremity without eliciting hip pain.  Points to the right SI joint a very of pain but not reproducible on exam.  No midline spinal tenderness step-offs or deformities.  Skin:    Coloration: Skin is not pale.     Findings: No rash.  Neurological:     Mental Status: He is alert and oriented to person, place, and time.  Psychiatric:        Behavior: Behavior  normal.     ED Results / Procedures / Treatments   Labs (all labs ordered are listed, but only abnormal results are displayed) Labs Reviewed  CBC WITH DIFFERENTIAL/PLATELET    EKG None  Radiology CT Lumbar  Spine Wo Contrast  Result Date: 08/04/2022 CLINICAL DATA:  Fall, back pain, right lower extremity radicular pain EXAM: CT LUMBAR SPINE WITHOUT CONTRAST TECHNIQUE: Multidetector CT imaging of the lumbar spine was performed without intravenous contrast administration. Multiplanar CT image reconstructions were also generated. RADIATION DOSE REDUCTION: This exam was performed according to the departmental dose-optimization program which includes automated exposure control, adjustment of the mA and/or kV according to patient size and/or use of iterative reconstruction technique. COMPARISON:  None Available. FINDINGS: Segmentation: 5 lumbar type vertebrae. Alignment: Normal. Vertebrae: No acute fracture or focal pathologic process. Paraspinal and other soft tissues: There is thickening and heterogeneous hyperdensity of the right iliopsoas muscle in keeping with intramuscular hemorrhage. A more focal roughly 3.5 x 4.5 x 9.5 cm hematoma is noted within the enlarged iliopsoas muscle. There is mild infiltration within the retroperitoneum superficial to this region. No loculated paraspinal fluid collection. No canal hematoma. Disc levels: There is intervertebral disc space narrowing at L4-5 and L5-S1 and endplate remodeling throughout the lumbar spine in keeping with changes of diffuse mild to moderate degenerative disc disease. Facet arthrosis of L3-S1 noted. Broad-based disc bulge and mild associated laminar hypertrophy at L2-3 results in mild central canal stenosis. Similar changes are noted at L3-4. No high-grade canal stenosis. Facet arthrosis results in mild bilateral neuroforaminal narrowing at L4-5 and moderate bilateral neuroforaminal narrowing at L5-S1 with possible abutment of the exiting right L5  nerve root. IMPRESSION: 1. No acute fracture or listhesis of the lumbar spine. 2. Right iliopsoas intramuscular hemorrhage with a more focal roughly 3.5 x 4.5 x 9.5 cm hematoma within the enlarged iliopsoas muscle. 3. Multilevel degenerative disc and degenerative joint disease resulting in mild central canal stenosis at L2-3 and L3-4 and moderate bilateral neuroforaminal narrowing at L5-S1 with possible abutment of the exiting right L5 nerve root. This could be better assessed with MRI examination. Electronically Signed   By: Helyn Numbers M.D.   On: 08/04/2022 01:03   DG HIP UNILAT WITH PELVIS 2-3 VIEWS RIGHT  Result Date: 08/03/2022 CLINICAL DATA:  Right hip pain after a fall. EXAM: DG HIP (WITH OR WITHOUT PELVIS) 2-3V RIGHT COMPARISON:  07/28/2022 FINDINGS: Degenerative changes in the lower lumbar spine and hips. 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 No evidence of acute fracture or dislocation. Pelvis appears intact. SI joints and symphysis pubis are not displaced. Visualized sacrum appears intact. IMPRESSION: Degenerative changes in the right hip.  No acute bony abnormalities. Electronically Signed   By: Burman Nieves M.D.   On: 08/03/2022 23:59    Procedures Procedures    Medications Ordered in ED Medications  acetaminophen (TYLENOL) tablet 1,000 mg (1,000 mg Oral Given 08/04/22 0019)  ketorolac (TORADOL) 15 MG/ML injection 15 mg (15 mg Intramuscular Given 08/04/22 0019)  oxyCODONE (Oxy IR/ROXICODONE) immediate release tablet 5 mg (5 mg Oral Given 08/04/22 0019)  diazepam (VALIUM) tablet 2 mg (2 mg Oral Given 08/04/22 0019)    ED Course/ Medical Decision Making/ A&P                             Medical Decision Making Amount and/or Complexity of Data Reviewed Labs: ordered. Radiology: ordered.  Risk OTC drugs. Prescription drug management.   72 yo M with a chief complaints of right-sided low back pain that radiates to the leg.  This has been going on  for about a week now.  It was status post  fall.  He was seen at the ED at the onset of his symptoms and had imaging performed that was negative for obvious acute fracture.  He had gotten better and then had recurrence when he tried to get into his car today.  No repeat trauma.  I reviewed the patient's past visit, there is no advanced imaging of the L-spine which I think would be the most likely acute cause of symptoms.  Will obtain a CT study to assess for possible new compression fracture.  Will treat pain here.  Patient feeling much better on repeat assessment.  CT scan of the L-spine without obvious acute intraspinal trauma.  The patient does have a fairly large iliopsoas hematoma.  I discussed with the provider on-call for Dewaine Conger, [place] PA-C she felt it was reasonable for outpatient follow-up.  I will have him call the office today.  2:13 AM:  I have discussed the diagnosis/risks/treatment options with the patient.  Evaluation and diagnostic testing in the emergency department does not suggest an emergent condition requiring admission or immediate intervention beyond what has been performed at this time.  They will follow up with PCP. We also discussed returning to the ED immediately if new or worsening sx occur. We discussed the sx which are most concerning (e.g., sudden worsening pain, fever, inability to tolerate by mouth) that necessitate immediate return. Medications administered to the patient during their visit and any new prescriptions provided to the patient are listed below.  Medications given during this visit Medications  acetaminophen (TYLENOL) tablet 1,000 mg (1,000 mg Oral Given 08/04/22 0019)  ketorolac (TORADOL) 15 MG/ML injection 15 mg (15 mg Intramuscular Given 08/04/22 0019)  oxyCODONE (Oxy IR/ROXICODONE) immediate release tablet 5 mg (5 mg Oral Given 08/04/22 0019)  diazepam (VALIUM) tablet 2 mg (2 mg Oral Given 08/04/22 0019)     The patient appears reasonably screen  and/or stabilized for discharge and I doubt any other medical condition or other [facility] requiring further screening, evaluation, or treatment in the ED at this time prior to discharge.          Final Clinical Impression(s) / ED Diagnoses Final diagnoses:  Iliopsoas muscle hematoma, right, initial encounter    Rx / DC Orders ED Discharge Orders          Ordered    methylPREDNISolone (MEDROL DOSEPAK) 4 MG TBPK tablet        08/04/22 0211    diclofenac Sodium (VOLTAREN) 1 % GEL  4 times daily        08/04/22 0212              Melene Plan, DO 08/04/22 [phone]

## 2022-08-04 NOTE — Discharge Instructions (Addendum)
Take the steroids as prescribed, use the diclofenac gel 4 times a day. Also take tylenol 1000mg (2 extra strength) four times a day.   Then take the pain medicine if you feel like you need it. Narcotics do not help with the pain, they only make you care about it less.  You can become addicted to this, people may break into your house to steal it.  It will constipate you.  If you drive under the influence of this medicine you can get a DUI.

## 2022-08-07 DIAGNOSIS — M25551 Pain in right hip: Secondary | ICD-10-CM | POA: Diagnosis not present

## 2022-08-11 ENCOUNTER — Telehealth: Payer: Self-pay

## 2022-08-11 NOTE — Telephone Encounter (Signed)
Transition Care Management Unsuccessful Follow-up Telephone Call  Date of discharge and from where:  4/26  Attempts:  1st Attempt  Reason for unsuccessful TCM follow-up call:  Left voice message   Lenard Forth [facility] Guide, [facility] [phone] [address] [place], [place], [place] [address] Phone: [phone] Email: [email or web address]

## 2022-08-14 ENCOUNTER — Telehealth: Payer: Self-pay

## 2022-08-14 NOTE — Telephone Encounter (Signed)
Transition Care Management Follow-up Telephone Call Date of discharge and from where: 4/26 [facility]  How have you been since you were released from the hospital? Much better Any questions or concerns? No  Items Reviewed: Did the pt receive and understand the discharge instructions provided? Yes  Medications obtained and verified? Yes  Other? No  Any new allergies since your discharge? No Dietary orders reviewed? No Do you have support at home? Yes    Follow up appointments reviewed:  PCP Hospital f/u appt confirmed? No  Scheduled to see  on  @ . Specialist Hospital f/u appt confirmed? Yes  Scheduled to see Orthopedic Surgeon on  @ . Are transportation arrangements needed? No  If their condition worsens, is the pt aware to call PCP or go to the Emergency Dept.? Yes Was the patient provided with contact information for the PCP's office or ED? Yes Was to pt encouraged to call back with questions or concerns? Yes

## 2022-08-22 DIAGNOSIS — L853 Xerosis cutis: Secondary | ICD-10-CM | POA: Diagnosis not present

## 2022-08-22 DIAGNOSIS — L814 Other melanin hyperpigmentation: Secondary | ICD-10-CM | POA: Diagnosis not present

## 2022-08-22 DIAGNOSIS — Z85828 Personal history of other malignant neoplasm of skin: Secondary | ICD-10-CM | POA: Diagnosis not present

## 2022-08-22 DIAGNOSIS — D225 Melanocytic nevi of trunk: Secondary | ICD-10-CM | POA: Diagnosis not present

## 2022-08-22 DIAGNOSIS — Z872 Personal history of diseases of the skin and subcutaneous tissue: Secondary | ICD-10-CM | POA: Diagnosis not present

## 2022-08-22 DIAGNOSIS — Z08 Encounter for follow-up examination after completed treatment for malignant neoplasm: Secondary | ICD-10-CM | POA: Diagnosis not present

## 2022-08-22 DIAGNOSIS — L821 Other seborrheic keratosis: Secondary | ICD-10-CM | POA: Diagnosis not present

## 2022-08-30 DIAGNOSIS — M25551 Pain in right hip: Secondary | ICD-10-CM | POA: Diagnosis not present

## 2022-09-01 DIAGNOSIS — H25813 Combined forms of age-related cataract, bilateral: Secondary | ICD-10-CM | POA: Diagnosis not present

## 2022-09-01 DIAGNOSIS — H25812 Combined forms of age-related cataract, left eye: Secondary | ICD-10-CM | POA: Diagnosis not present

## 2022-09-01 DIAGNOSIS — H524 Presbyopia: Secondary | ICD-10-CM | POA: Diagnosis not present

## 2022-09-28 ENCOUNTER — Other Ambulatory Visit: Payer: Self-pay | Admitting: Cardiology

## 2022-09-28 NOTE — Telephone Encounter (Signed)
Rx(s) sent to pharmacy electronically.  

## 2022-10-20 DIAGNOSIS — G4733 Obstructive sleep apnea (adult) (pediatric): Secondary | ICD-10-CM | POA: Diagnosis not present

## 2022-12-06 DIAGNOSIS — H25812 Combined forms of age-related cataract, left eye: Secondary | ICD-10-CM | POA: Diagnosis not present

## 2022-12-06 DIAGNOSIS — H268 Other specified cataract: Secondary | ICD-10-CM | POA: Diagnosis not present

## 2022-12-20 ENCOUNTER — Other Ambulatory Visit: Payer: Self-pay | Admitting: Cardiology

## 2022-12-26 ENCOUNTER — Other Ambulatory Visit: Payer: Self-pay | Admitting: Cardiology

## 2023-01-03 ENCOUNTER — Other Ambulatory Visit: Payer: Self-pay | Admitting: Cardiology

## 2023-01-10 ENCOUNTER — Telehealth: Payer: Self-pay

## 2023-01-10 NOTE — Progress Notes (Signed)
01/10/2023  Patient ID: Gabriel Aguilar, male   DOB: Jun 06, 1950, 72 y.o.   MRN: [id number]  Attempted to contact patient for scheduled appointment for medication management. Left HIPAA compliant message for patient to return my call at their convenience.   Sherrill Raring, PharmD Clinical Pharmacist [phone]

## 2023-01-17 DIAGNOSIS — H25811 Combined forms of age-related cataract, right eye: Secondary | ICD-10-CM | POA: Diagnosis not present

## 2023-01-22 ENCOUNTER — Ambulatory Visit: Payer: PPO | Admitting: Family Medicine

## 2023-01-22 VITALS — BP 100/72 | HR 63 | Temp 99.0°F | Ht 68.0 in | Wt 199.8 lb

## 2023-01-22 DIAGNOSIS — R197 Diarrhea, unspecified: Secondary | ICD-10-CM

## 2023-01-22 LAB — POC COVID19 BINAXNOW: SARS Coronavirus 2 Ag: NEGATIVE

## 2023-01-22 MED ORDER — CIPROFLOXACIN HCL 500 MG PO TABS
500.0000 mg | ORAL_TABLET | Freq: Two times a day (BID) | ORAL | 0 refills | Status: AC
Start: 2023-01-22 — End: 2023-01-27

## 2023-01-22 NOTE — Progress Notes (Signed)
Established Patient Office Visit   Subjective  Patient ID: Gabriel Aguilar, male    DOB: June 01, 1950  Age: 72 y.o. MRN: [id number]  Chief Complaint  Patient presents with   Diarrhea    Patient came in today for diarrhea from a traveling, started a 1 week ago, tired, no appetite     Pt is a 72 yo male followed by Dr. Clent Ridges who was seen for acute concern.  Patient endorses traveling to [place] for 1 month to visit his wife's family.  Upon return last Tuesday developed loose, watery stools.  Having about 3-4 loose stools per day.  One loose stool today.  Appetite decreased.  Denies nausea, vomiting, fever, weight loss, bloody stools, abdominal pain.  Tried Imodium without improvement.  Typically drinks canned/bottled water and sodas during travel.  Diarrhea     Patient Active Problem List   Diagnosis Date Noted   Primary osteoarthritis of both hands 01/31/2022   COVID-19 virus infection 10/25/2020   History of colon polyps 07/02/2020   Chronic anticoagulation 07/02/2020   Perennial non-allergic rhinitis 01/29/2020   T-cell chronic lymphocytic leukemia (HCC) 07/14/2019   Antiplatelet or antithrombotic long-term use 09/16/2018   Coronary artery disease involving native coronary artery of native heart without angina pectoris 09/16/2018   Dyslipidemia 09/16/2018   Essential hypertension 09/16/2018   Educated about COVID-19 virus infection 09/16/2018   Ankle edema, bilateral 07/17/2018   Status post coronary artery stent placement    NSTEMI (non-ST elevated myocardial infarction) (HCC)    Elevated troponin 05/28/2018   Prostatitis 07/05/2015   BPH (benign prostatic hyperplasia) 07/01/2015   Dysuria 07/01/2015   PAF (paroxysmal atrial fibrillation) (HCC)    Acute pericarditis    Second degree AV block    Chest pain 05/11/2015   GERD (gastroesophageal reflux disease) 05/11/2015   Persistent lymphocytosis 05/11/2015   Single kidney 05/11/2015   Hyperlipidemia 05/11/2015   Other and  unspecified hyperlipidemia 06/02/2013   OSA (obstructive sleep apnea) 03/05/2013   Past Medical History:  Diagnosis Date   Arthritis    Coronary artery disease    Erectile dysfunction    GERD (gastroesophageal reflux disease)    Hiatal hernia    History of pericarditis    HTN (hypertension)    Hyperlipidemia LDL goal <70    Hypogonadism male    has seen  Dr. Ernestine Conrad   Palpitation    a. montior 2 weeks 2014--> no arrhythmia   Paroxysmal atrial fibrillation (HCC)    Sleep apnea, central    wears a CPAP at night    Past Surgical History:  Procedure Laterality Date   BACK SURGERY  1994   L4-5   CARDIAC CATHETERIZATION     COLONOSCOPY  08/26/2020   per Dr. 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    SPINE SURGERY  1994   disc at L4-L5   VASECTOMY     Social History   Tobacco Use  Smoking status: Former    Current packs/day: 0.00    Average packs/day: 1.5 packs/day for 18.9 years (28.4 ttl pk-yrs)    Types: Cigarettes    Start date: 04/10/1964    Quit date: 03/06/1983    Years since quitting: 39.9   Smokeless tobacco: Never  Vaping Use   Vaping status: Never Used  Substance Use Topics   Alcohol use: Yes    Alcohol/week: 1.0 standard drink of alcohol    Types: 1 Glasses of wine per week    Comment: rare   Drug use: No   Family History  Problem Relation Age of Onset   Hyperlipidemia Father 22       KIDNEY FAILURE   Heart disease Father     Hypertension Father    Kidney disease Father    Diabetes Father    Heart disease Mother 48   Other Sister        LIVER FAILURE   CVA Brother    Other Sister        AT BIRTH   Heart attack Sister    CVA Brother    Other Brother        SEVERAL CABG   Colon cancer Neg Hx    No Known Allergies    Review of Systems  Gastrointestinal:  Positive for diarrhea.   Negative unless stated above    Objective:     BP 100/72 (BP Location: Left Arm, Patient Position: Sitting, Cuff Size: Normal)   Pulse 63   Temp 99 F (37.2 C) (Oral)   Ht 5\' 8"  (1.727 m)   Wt 199 lb 12.8 oz (90.6 kg)   SpO2 98%   BMI 30.38 kg/m  BP Readings from Last 3 Encounters:  01/22/23 100/72  08/03/22 (!) 119/104  07/28/22 125/62   Wt Readings from Last 3 Encounters:  01/22/23 199 lb 12.8 oz (90.6 kg)  08/03/22 198 lb (89.8 kg)  07/28/22 198 lb (89.8 kg)      Physical Exam Constitutional:      Appearance: Normal appearance.  HENT:     Head: Normocephalic and atraumatic.     Nose: Nose normal.     Mouth/Throat:     Mouth: Mucous membranes are moist.  Eyes:     Extraocular Movements: Extraocular movements intact.     Conjunctiva/sclera: Conjunctivae normal.  Pulmonary:     Effort: Pulmonary effort is normal.  Abdominal:     General: Bowel sounds are increased.     Palpations: Abdomen is soft.     Tenderness: There is no abdominal tenderness. There is no guarding or rebound.  Skin:    General: Skin is warm and dry.  Neurological:     Mental Status: He is alert and oriented to person, place, and time.      Results for orders placed or performed in visit on 01/22/23  POC COVID-19 BinaxNow  Result Value Ref Range   SARS Coronavirus 2 Ag Negative Negative      Assessment & Plan:  Diarrhea, unspecified type -     POC COVID-19 BinaxNow -     Ciprofloxacin HCl; Take 1 tablet (500 mg total) by mouth 2 (two) times daily for 5 days.  Dispense: 10 tablet; Refill: 0  Acute GI symptoms s/p  travel possibly infectious.  POC COVID testing negative.  Labs unavailable in clinic this visit.  Start ABX.  Discussed using probiotic.  Rehydration solutions, Gatorade, Powerade, etc. encouraged.  Bland diet, advance as tolerated.  Return  if symptoms worsen or fail to improve.   Deeann Saint, MD

## 2023-01-22 NOTE — Patient Instructions (Signed)
A prescription for ciprofloxacin was sent to your pharmacy.  You can take 1 pill twice a day for the next 5 days to help with symptoms.  Also consider using a probiotic.

## 2023-01-24 ENCOUNTER — Ambulatory Visit: Payer: PPO | Admitting: Family Medicine

## 2023-02-06 DIAGNOSIS — H25811 Combined forms of age-related cataract, right eye: Secondary | ICD-10-CM | POA: Diagnosis not present

## 2023-02-06 DIAGNOSIS — H2511 Age-related nuclear cataract, right eye: Secondary | ICD-10-CM | POA: Diagnosis not present

## 2023-02-06 DIAGNOSIS — H268 Other specified cataract: Secondary | ICD-10-CM | POA: Diagnosis not present

## 2023-02-09 ENCOUNTER — Other Ambulatory Visit: Payer: Self-pay | Admitting: Cardiology

## 2023-03-21 ENCOUNTER — Other Ambulatory Visit: Payer: Self-pay | Admitting: Cardiology

## 2023-03-22 DIAGNOSIS — G4733 Obstructive sleep apnea (adult) (pediatric): Secondary | ICD-10-CM | POA: Diagnosis not present

## 2023-04-09 ENCOUNTER — Other Ambulatory Visit: Payer: Self-pay | Admitting: Cardiology

## 2023-04-17 ENCOUNTER — Other Ambulatory Visit: Payer: Self-pay | Admitting: Cardiology

## 2023-05-01 ENCOUNTER — Other Ambulatory Visit: Payer: Self-pay | Admitting: Cardiology

## 2023-05-02 ENCOUNTER — Other Ambulatory Visit: Payer: Self-pay | Admitting: Cardiology

## 2023-05-28 ENCOUNTER — Other Ambulatory Visit: Payer: Self-pay | Admitting: Cardiology

## 2023-06-13 ENCOUNTER — Other Ambulatory Visit: Payer: Self-pay | Admitting: Cardiology

## 2023-07-03 DIAGNOSIS — Z961 Presence of intraocular lens: Secondary | ICD-10-CM | POA: Diagnosis not present

## 2023-07-03 DIAGNOSIS — H26493 Other secondary cataract, bilateral: Secondary | ICD-10-CM | POA: Diagnosis not present

## 2023-07-03 DIAGNOSIS — H524 Presbyopia: Secondary | ICD-10-CM | POA: Diagnosis not present

## 2023-07-03 DIAGNOSIS — H43813 Vitreous degeneration, bilateral: Secondary | ICD-10-CM | POA: Diagnosis not present

## 2023-07-17 DIAGNOSIS — H26491 Other secondary cataract, right eye: Secondary | ICD-10-CM | POA: Diagnosis not present

## 2023-07-30 ENCOUNTER — Other Ambulatory Visit: Payer: Self-pay | Admitting: Cardiology

## 2023-09-18 DIAGNOSIS — R208 Other disturbances of skin sensation: Secondary | ICD-10-CM | POA: Diagnosis not present

## 2023-09-18 DIAGNOSIS — L2989 Other pruritus: Secondary | ICD-10-CM | POA: Diagnosis not present

## 2023-09-18 DIAGNOSIS — Z789 Other specified health status: Secondary | ICD-10-CM | POA: Diagnosis not present

## 2023-09-18 DIAGNOSIS — L820 Inflamed seborrheic keratosis: Secondary | ICD-10-CM | POA: Diagnosis not present

## 2023-09-18 DIAGNOSIS — L821 Other seborrheic keratosis: Secondary | ICD-10-CM | POA: Diagnosis not present

## 2023-09-18 DIAGNOSIS — L538 Other specified erythematous conditions: Secondary | ICD-10-CM | POA: Diagnosis not present

## 2023-09-18 DIAGNOSIS — L814 Other melanin hyperpigmentation: Secondary | ICD-10-CM | POA: Diagnosis not present

## 2023-09-18 DIAGNOSIS — L281 Prurigo nodularis: Secondary | ICD-10-CM | POA: Diagnosis not present

## 2023-09-18 DIAGNOSIS — L570 Actinic keratosis: Secondary | ICD-10-CM | POA: Diagnosis not present

## 2023-10-17 ENCOUNTER — Ambulatory Visit

## 2023-10-17 VITALS — Ht 68.0 in | Wt 199.0 lb

## 2023-10-17 DIAGNOSIS — Z0000 Encounter for general adult medical examination without abnormal findings: Secondary | ICD-10-CM | POA: Diagnosis not present

## 2023-10-17 NOTE — Patient Instructions (Addendum)
 Mr. Every , Thank you for taking time out of your busy schedule to complete your Annual Wellness Visit with me. I enjoyed our conversation and look forward to speaking with you again next year. I, as well as your care team,  appreciate your ongoing commitment to your health goals. Please review the following plan we discussed and let me know if I can assist you in the future. Your Game plan/ To Do List    Referrals: If you haven't heard from the office you've been referred to, please reach out to them at the phone provided.   Follow up Visits: Next Medicare AWV with our clinical staff: 10/22/24 @ 8:40a   Have you seen your provider in the last 6 months (3 months if uncontrolled diabetes)? 01/14/23 Next Office Visit with your provider: Pending appointment for yearly Physical  Clinician Recommendations:  Aim for 30 minutes of exercise or brisk walking, 6-8 glasses of water, and 5 servings of fruits and vegetables each day.       This is a list of the screening recommended for you and due dates:  Health Maintenance  Topic Date Due   Complete foot exam   Never done   Eye exam for diabetics  Never done   Yearly kidney health urinalysis for diabetes  Never done   Zoster (Shingles) Vaccine (1 of 2) Never done   Hemoglobin A1C  02/08/2022   COVID-19 Vaccine (4 - 2024-25 season) 12/10/2022   Yearly kidney function blood test for diabetes  07/28/2023   Colon Cancer Screening  08/27/2023   Flu Shot  11/09/2023   Medicare Annual Wellness Visit  10/16/2024   DTaP/Tdap/Td vaccine (2 - Td or Tdap) 08/05/2030   Pneumococcal Vaccine for age over 65  Completed   Hepatitis C Screening  Completed   Hepatitis B Vaccine  Aged Out   HPV Vaccine  Aged Out   Meningitis B Vaccine  Aged Out    Advanced directives: (Declined) Advance directive discussed with you today. Even though you declined this today, please call our office should you change your mind, and we can give you the proper paperwork for you to  fill out. Advance Care Planning is important because it:  [x]  Makes sure you receive the medical care that is consistent with your values, goals, and preferences  [x]  It provides guidance to your family and loved ones and reduces their decisional burden about whether or not they are making the right decisions based on your wishes.  Follow the link provided in your after visit summary or read over the paperwork we have mailed to you to help you started getting your Advance Directives in place. If you need assistance in completing these, please reach out to us  so that we can help you!  See attachments for Preventive Care and Fall Prevention Tips.

## 2023-10-17 NOTE — Progress Notes (Signed)
 Subjective:   Gabriel Aguilar is a 73 y.o. who presents for a Medicare Wellness preventive visit.  As a reminder, Annual Wellness Visits don't include a physical exam, and some assessments may be limited, especially if this visit is performed virtually. We may recommend an in-person follow-up visit with your provider if needed.  Visit Complete: Virtual I connected with  Gabriel Aguilar on 10/17/23 by a audio enabled telemedicine application and verified that I am speaking with the correct person using two identifiers.  Patient Location: Home  Provider Location: Home Office  I discussed the limitations of evaluation and management by telemedicine. The patient expressed understanding and agreed to proceed.  Vital Signs: Because this visit was a virtual/telehealth visit, some criteria may be missing or patient reported. Any vitals not documented were not able to be obtained and vitals that have been documented are patient reported.    Persons Participating in Visit: Patient.  AWV Questionnaire: Yes: Patient Medicare AWV questionnaire was completed by the patient on 10/17/23; I have confirmed that all information answered by patient is correct and no changes since this date.  Cardiac Risk Factors include: advanced age (>73men, >8 women);male gender;hypertension     Objective:    Today's Vitals   10/17/23 0847  Weight: 199 lb (90.3 kg)  Height: 5' 8 (1.727 m)   Body mass index is 30.26 kg/m.     10/17/2023    8:57 AM 08/03/2022   11:28 PM 07/28/2022    8:15 PM 05/08/2022    3:23 PM 05/04/2021    3:29 PM 03/23/2021   11:48 AM 03/17/2020    2:48 PM  Advanced Directives  Does Patient Have a Medical Advance Directive? No No No No No No No  Would patient like information on creating a medical advance directive? No - Patient declined  No - Patient declined No - Patient declined No - Patient declined Yes (ED - Information included in AVS) No - Patient declined    Current Medications  (verified) Outpatient Encounter Medications as of 10/17/2023  Medication Sig   amLODipine  (NORVASC ) 10 MG tablet TAKE 1 TABLET BY MOUTH DAILY. PLEASE CALL OFFICE TO SCHEDULE AN APPT FOR FURTHER REFILLS. THANK YOU   Ascorbic Acid (VITAMIN C ) 1000 MG tablet Take 1,000 mg by mouth daily.   b complex vitamins capsule Take 1 capsule by mouth daily.   Black Pepper-Turmeric (TURMERIC CURCUMIN) 08-998 MG CAPS Take 1,000 mg by mouth daily.   Cholecalciferol (VITAMIN D) 50 MCG (2000 UT) CAPS Take 1 capsule by mouth daily.   cholestyramine  light (PREVALITE ) 4 g packet Take 1 packet (4 g total) by mouth 2 (two) times daily.   clopidogrel  (PLAVIX ) 75 MG tablet TAKE 1 TABLET BY MOUTH EVERY DAY IN THE MORNING   cyclobenzaprine  (FLEXERIL ) 10 MG tablet Take 1 tablet (10 mg total) by mouth 2 (two) times daily as needed for muscle spasms. (Patient not taking: Reported on 10/17/2023)   diclofenac  Sodium (VOLTAREN ) 1 % GEL Apply 4 g topically 4 (four) times daily. (Patient not taking: Reported on 10/17/2023)   dicyclomine  (BENTYL ) 20 MG tablet Take 1 tablet (20 mg total) by mouth 2 (two) times daily. (Patient not taking: Reported on 10/17/2023)   fenofibrate  54 MG tablet TAKE 1 TABLET BY MOUTH DAILY. PLEASE CALL OFFICE TO SCHEDULE AN APPT FOR FURTHER REFILLS. THANK YOU   hydrochlorothiazide  (HYDRODIURIL ) 25 MG tablet Take 1 tablet (25 mg total) by mouth daily. Please call office to schedule an appt for further refills.  Thank you   LORazepam  (ATIVAN ) 0.5 MG tablet TAKE 1 TABLET BY MOUTH AT BEDTIME.   magnesium oxide (MAG-OX) 400 (240 Mg) MG tablet Take 400 mg by mouth daily.   methylPREDNISolone  (MEDROL  DOSEPAK) 4 MG TBPK tablet Day 1: 8mg  before breakfast, 4 mg after lunch, 4 mg after supper, and 8 mg at bedtime Day 2: 4 mg before breakfast, 4 mg after lunch, 4 mg  after supper, and 8 mg  at bedtime Day 3:  4 mg  before breakfast, 4 mg  after lunch, 4 mg after supper, and 4 mg  at bedtime Day 4: 4 mg  before breakfast, 4 mg   after lunch, and 4 mg at bedtime Day 5: 4 mg  before breakfast and 4 mg at bedtime Day 6: 4 mg  before breakfast (Patient not taking: Reported on 10/17/2023)   metoprolol  tartrate (LOPRESSOR ) 25 MG tablet Take 0.5 tablets (12.5 mg total) by mouth every 8 (eight) hours as needed (ELEVATED HEART RATE).   niacin 500 MG tablet Take 500 mg by mouth at bedtime.   nitroGLYCERIN  (NITROSTAT ) 0.4 MG SL tablet Place 1 tablet (0.4 mg total) under the tongue every 5 (five) minutes as needed for chest pain.   omeprazole  (PRILOSEC) 40 MG capsule Take 1 capsule (40 mg total) by mouth daily. Please call [phone] to schedule an overdue appointment for future refills. Thank you. 3RD FINAL ATTEMPT.   oxyCODONE  (ROXICODONE ) 5 MG immediate release tablet Take 1 tablet (5 mg total) by mouth every 6 (six) hours as needed for up to 10 doses for breakthrough pain. (Patient not taking: Reported on 10/17/2023)   rosuvastatin  (CRESTOR ) 40 MG tablet TAKE 1 TABLET (40 MG TOTAL) BY MOUTH DAILY. NEED APPOINTMENT   triamcinolone  cream (KENALOG ) 0.1 % Apply topically 2 (two) times daily.   XARELTO  20 MG TABS tablet TAKE ONE TABLET BY MOUTH DAILY with SUPPER   zinc gluconate 50 MG tablet Take 50 mg by mouth daily.   No facility-administered encounter medications on file as of 10/17/2023.    Allergies (verified) Patient has no known allergies.   History: Past Medical History:  Diagnosis Date   Arthritis    Coronary artery disease    Erectile dysfunction    GERD (gastroesophageal reflux disease)    Hiatal hernia    History of pericarditis    HTN (hypertension)    Hyperlipidemia LDL goal <70    Hypogonadism male    has seen  Dr. Sid   Palpitation    a. montior 2 weeks 2014--> no arrhythmia   Paroxysmal atrial fibrillation (HCC)    Sleep apnea, central    wears a CPAP at night    Past Surgical History:  Procedure Laterality Date   BACK SURGERY  1994   L4-5   CARDIAC CATHETERIZATION     COLONOSCOPY  08/26/2020    per Dr. 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    SPINE SURGERY  1994   disc at L4-L5   VASECTOMY     Family History  Problem Relation Age of Onset   Hyperlipidemia Father 7       KIDNEY FAILURE   Heart disease Father    Hypertension Father    Kidney disease Father    Diabetes Father    Heart disease Mother 33   Other Sister        LIVER FAILURE   CVA Brother    Other Sister        AT BIRTH   Heart attack Sister    CVA Brother    Other Brother        SEVERAL CABG   Colon cancer Neg Hx    Social History   Socioeconomic History   Marital status: Married    Spouse name: Not on file   Number of children: 2   Years of education: Not on file   Highest education level: Some college, no degree  Occupational History   Occupation: Film/video editor    Comment: works FT from home  Tobacco Use   Smoking status: Former    Current packs/day: 0.00    Average packs/day: 1.5 packs/day for 18.9 years (28.4 ttl pk-yrs)    Types: Cigarettes    Start date: 04/10/1964    Quit date: 03/06/1983    Years since quitting: 40.6   Smokeless tobacco: Never  Vaping Use   Vaping status: Never Used  Substance and Sexual Activity   Alcohol use: Yes    Alcohol/week: 1.0 standard drink of alcohol    Types: 1 Glasses of wine per week    Comment: rare   Drug use: No   Sexual activity: Not on file  Other Topics Concern   Not on file  Social History Narrative   05/13/2018:  Lives with wife in multi-level home, considering down-sizing in next few years, has two grown children.   Not quite ready to retire.   Loves to travel, planning [place] trip with wife in April   Goes to [place], uses elliptical and [employer]   Social Drivers of Health   Financial Resource Strain: Patient Declined (10/17/2023)   Overall Financial Resource Strain (CARDIA)    Difficulty of Paying Living Expenses: Patient declined  Food Insecurity: Patient Declined (10/17/2023)   Hunger Vital Sign    Worried About Running Out of Food in the Last Year: Patient declined    Ran Out of Food in the Last Year: Patient declined  Transportation Needs: Patient Declined (10/17/2023)   PRAPARE - Administrator, Civil Service (Medical): Patient declined    Lack of Transportation (Non-Medical): Patient declined  Physical Activity: Sufficiently Active (10/17/2023)   Exercise Vital Sign    Days of Exercise per Week: 4 days    Minutes of Exercise per Session: 60 min  Stress: No Stress Concern Present (10/17/2023)   [employer] of Occupational Health - Occupational Stress Questionnaire    Feeling of Stress: Not at all  Social Connections: Unknown (10/17/2023)   Social Connection and Isolation Panel    Frequency of Communication with Friends and Family: Patient declined    Frequency of Social Gatherings with Friends and Family: Patient declined    Attends Religious Services: Patient declined    Database administrator or Organizations: Patient declined    Attends Banker Meetings: Not on file    Marital Status: Patient declined    Tobacco Counseling Counseling given: Not Answered  Clinical Intake:  Pre-visit preparation completed: Yes  Pain : No/denies pain     BMI - recorded: 30.26 Nutritional Status: BMI > 30  Obese Nutritional Risks: None Diabetes: No  Lab Results  Component Value Date   HGBA1C 5.7 08/08/2021   HGBA1C 5.9 08/04/2020   HGBA1C 5.7 06/06/2019      How often do you need to have someone help you when you read instructions, pamphlets, or other written materials from your doctor or pharmacy?: 1 - Never  Interpreter Needed?: No  Information entered by :: Gabriel Blush  LPN   Activities of Daily Living     10/17/2023    8:56 AM 10/17/2023    8:27 AM  In your present state of health, do you have any difficulty performing the following activities:  Hearing? 0 0  Vision? 0 0  Difficulty concentrating or making decisions? 0 0  Walking or climbing stairs? 0 0  Dressing or bathing? 0 0  Doing errands, shopping? 0 0  Preparing Food and eating ? N N  Using the Toilet? N N  In the past six months, have you accidently leaked urine? N N  Do you have problems with loss of bowel control? N N  Managing your Medications? N N  Managing your Finances? N N  Housekeeping or managing your Housekeeping? N N    Patient Care Team: Johnny Garnette LABOR, MD as PCP - General (Family Medicine) Lavona Agent, MD as PCP - Cardiology (Cardiology) Kelsie Agent, MD (Inactive) as PCP - Electrophysiology (Cardiology) Jude Harden GAILS, MD as Consulting Physician (Pulmonary Disease) Magrinat, Sandria BROCKS, MD (Inactive) as Consulting Physician (Oncology) Lionell Jon DEL, [facility] (Pharmacist)  I have updated your Care Teams any recent Medical Services you may have received from other providers in the past year.     Assessment:   This is a routine wellness examination for Gabriel Aguilar.  Hearing/Vision screen Hearing Screening - Comments:: Denies hearing difficulties   Vision Screening - Comments:: Wears rx glasses - up to date with routine eye exams with  [facility]   Goals Addressed               This Visit's Progress     Increase physical activity (pt-stated)        Lose weight.       Depression Screen     10/17/2023    8:58 AM 01/22/2023    3:16 PM 05/08/2022    3:19 PM 01/31/2022   10:42 AM 01/17/2022    9:28 AM 11/16/2021    8:34 AM 08/08/2021     8:32 AM  PHQ 2/9 Scores  PHQ - 2 Score 0 0 0 0 0 0 0  PHQ- 9 Score  0  1 0 1 4    Fall Risk     10/17/2023    8:56 AM 10/17/2023    8:27 AM 01/22/2023    2:33 PM 01/22/2023    9:55 AM 05/08/2022    3:21 PM  Fall Risk   Falls in the past year? 0 0  1 0  Number falls in past yr: 0   0 0  Injury with Fall? 0   1 0  Risk for fall due to : No Fall Risks    No Fall Risks  Follow up Falls evaluation completed  Falls evaluation completed  Falls prevention discussed    MEDICARE RISK AT HOME:  Medicare Risk at Home Any stairs in or around the home?: Yes If  so, are there any without handrails?: No Home free of loose throw rugs in walkways, pet beds, electrical cords, etc?: Yes Adequate lighting in your home to reduce risk of falls?: Yes Life alert?: No Use of a cane, walker or w/c?: No Grab bars in the bathroom?: No Shower chair or bench in shower?: No Elevated toilet seat or a handicapped toilet?: No  TIMED UP AND GO:  Was the test performed?  No  Cognitive Function: 6CIT completed        10/17/2023    8:57 AM 05/08/2022    3:23 PM 05/04/2021    3:27 PM  6CIT Screen  What Year? 0 points 0 points 0 points  What month? 0 points 0 points 0 points  What time? 0 points 0 points 0 points  Count back from 20 0 points 0 points 0 points  Months in reverse 0 points 0 points 0 points  Repeat phrase 0 points 0 points 0 points  Total Score 0 points 0 points 0 points    Immunizations Immunization History  Administered Date(s) Administered   Fluad Quad(high Dose 65+) 03/17/2020   Influenza Split 02/22/2011   PFIZER(Purple Top)SARS-COV-2 Vaccination 11/05/2019, 12/05/2019, 07/31/2020   Pneumococcal Conjugate-13 05/13/2018   Pneumococcal Polysaccharide-23 08/04/2020   Tdap 08/04/2020    Screening Tests Health Maintenance  Topic Date Due   FOOT EXAM  Never done   OPHTHALMOLOGY EXAM  Never done   Diabetic kidney evaluation - Urine ACR  Never done   Zoster Vaccines- Shingrix (1 of 2)  Never done   HEMOGLOBIN A1C  02/08/2022   COVID-19 Vaccine (4 - 2024-25 season) 12/10/2022   Diabetic kidney evaluation - eGFR measurement  07/28/2023   Colonoscopy  08/27/2023   INFLUENZA VACCINE  11/09/2023   Medicare Annual Wellness (AWV)  10/16/2024   DTaP/Tdap/Td (2 - Td or Tdap) 08/05/2030   Pneumococcal Vaccine: 50+ Years  Completed   Hepatitis C Screening  Completed   Hepatitis B Vaccines  Aged Out   HPV VACCINES  Aged Out   Meningococcal B Vaccine  Aged Out    Health Maintenance  Health Maintenance Due  Topic Date Due   FOOT EXAM  Never done   OPHTHALMOLOGY EXAM  Never done   Diabetic kidney evaluation - Urine ACR  Never done   Zoster Vaccines- Shingrix (1 of 2) Never done   HEMOGLOBIN A1C  02/08/2022   COVID-19 Vaccine (4 - 2024-25 season) 12/10/2022   Diabetic kidney evaluation - eGFR measurement  07/28/2023   Colonoscopy  08/27/2023   Health Maintenance Items Addressed: Patient deferred colonoscopy, labs and eye exam.  Additional Screening:  Vision Screening: Recommended annual ophthalmology exams for early detection of glaucoma and other disorders of the eye. Would you like a referral to an eye doctor? No    Dental Screening: Recommended annual dental exams for proper oral hygiene  Community Resource Referral / Chronic Care Management: CRR required this visit?  No   CCM required this visit?  No   Plan:    I have personally reviewed and noted the following in the patient's chart:   Medical and social history Use of alcohol, tobacco or illicit drugs  Current medications and supplements including opioid prescriptions. Patient is not currently taking opioid prescriptions. Functional ability and status Nutritional status Physical activity Advanced directives List of other physicians Hospitalizations, surgeries, and ER visits in previous 12 months Vitals Screenings to include cognitive, depression, and falls Referrals and appointments  In addition,  I have  reviewed and discussed with patient certain preventive protocols, quality metrics, and best practice recommendations. A written personalized care plan for preventive services as well as general preventive health recommendations were provided to patient.   Gabriel LELON Blush, LPN   [id number]   After Visit Summary: (MyChart) Due to this being a telephonic visit, the after visit summary with patients personalized plan was offered to patient via MyChart   Notes: Nothing significant to report at this time.

## 2023-10-25 DIAGNOSIS — Z08 Encounter for follow-up examination after completed treatment for malignant neoplasm: Secondary | ICD-10-CM | POA: Diagnosis not present

## 2023-10-25 DIAGNOSIS — L570 Actinic keratosis: Secondary | ICD-10-CM | POA: Diagnosis not present

## 2023-10-25 DIAGNOSIS — K116 Mucocele of salivary gland: Secondary | ICD-10-CM | POA: Diagnosis not present

## 2023-10-25 DIAGNOSIS — Z85828 Personal history of other malignant neoplasm of skin: Secondary | ICD-10-CM | POA: Diagnosis not present

## 2023-12-25 ENCOUNTER — Encounter: Payer: Self-pay | Admitting: Gastroenterology

## 2024-02-07 DIAGNOSIS — Z905 Acquired absence of kidney: Secondary | ICD-10-CM | POA: Diagnosis not present

## 2024-02-07 DIAGNOSIS — I10 Essential (primary) hypertension: Secondary | ICD-10-CM | POA: Diagnosis not present

## 2024-02-07 DIAGNOSIS — I480 Paroxysmal atrial fibrillation: Secondary | ICD-10-CM | POA: Diagnosis not present

## 2024-02-07 DIAGNOSIS — E785 Hyperlipidemia, unspecified: Secondary | ICD-10-CM | POA: Diagnosis not present

## 2024-02-07 DIAGNOSIS — M25551 Pain in right hip: Secondary | ICD-10-CM | POA: Diagnosis not present

## 2024-02-07 DIAGNOSIS — I2510 Atherosclerotic heart disease of native coronary artery without angina pectoris: Secondary | ICD-10-CM | POA: Diagnosis not present

## 2024-02-07 DIAGNOSIS — R3912 Poor urinary stream: Secondary | ICD-10-CM | POA: Diagnosis not present

## 2024-02-07 DIAGNOSIS — R7989 Other specified abnormal findings of blood chemistry: Secondary | ICD-10-CM | POA: Diagnosis not present

## 2024-02-07 DIAGNOSIS — M25552 Pain in left hip: Secondary | ICD-10-CM | POA: Diagnosis not present

## 2024-02-07 DIAGNOSIS — N401 Enlarged prostate with lower urinary tract symptoms: Secondary | ICD-10-CM | POA: Diagnosis not present

## 2024-02-07 DIAGNOSIS — F419 Anxiety disorder, unspecified: Secondary | ICD-10-CM | POA: Diagnosis not present

## 2024-02-11 DIAGNOSIS — H47092 Other disorders of optic nerve, not elsewhere classified, left eye: Secondary | ICD-10-CM | POA: Diagnosis not present

## 2024-02-13 DIAGNOSIS — J40 Bronchitis, not specified as acute or chronic: Secondary | ICD-10-CM | POA: Diagnosis not present

## 2024-02-13 DIAGNOSIS — B349 Viral infection, unspecified: Secondary | ICD-10-CM | POA: Diagnosis not present

## 2024-02-13 DIAGNOSIS — R519 Headache, unspecified: Secondary | ICD-10-CM | POA: Diagnosis not present

## 2024-02-13 DIAGNOSIS — J029 Acute pharyngitis, unspecified: Secondary | ICD-10-CM | POA: Diagnosis not present

## 2024-02-20 DIAGNOSIS — I480 Paroxysmal atrial fibrillation: Secondary | ICD-10-CM | POA: Diagnosis not present

## 2024-02-21 DIAGNOSIS — I480 Paroxysmal atrial fibrillation: Secondary | ICD-10-CM | POA: Diagnosis not present

## 2024-03-19 DIAGNOSIS — R051 Acute cough: Secondary | ICD-10-CM | POA: Diagnosis not present

## 2024-03-19 DIAGNOSIS — R0602 Shortness of breath: Secondary | ICD-10-CM | POA: Diagnosis not present

## 2024-03-20 DIAGNOSIS — L820 Inflamed seborrheic keratosis: Secondary | ICD-10-CM | POA: Diagnosis not present

## 2024-03-20 DIAGNOSIS — L281 Prurigo nodularis: Secondary | ICD-10-CM | POA: Diagnosis not present

## 2024-03-20 DIAGNOSIS — Z789 Other specified health status: Secondary | ICD-10-CM | POA: Diagnosis not present

## 2024-03-20 DIAGNOSIS — L538 Other specified erythematous conditions: Secondary | ICD-10-CM | POA: Diagnosis not present

## 2024-03-20 DIAGNOSIS — R208 Other disturbances of skin sensation: Secondary | ICD-10-CM | POA: Diagnosis not present

## 2024-03-20 DIAGNOSIS — Z85828 Personal history of other malignant neoplasm of skin: Secondary | ICD-10-CM | POA: Diagnosis not present

## 2024-03-20 DIAGNOSIS — L2989 Other pruritus: Secondary | ICD-10-CM | POA: Diagnosis not present

## 2024-03-20 DIAGNOSIS — Z08 Encounter for follow-up examination after completed treatment for malignant neoplasm: Secondary | ICD-10-CM | POA: Diagnosis not present

## 2024-03-20 DIAGNOSIS — D1801 Hemangioma of skin and subcutaneous tissue: Secondary | ICD-10-CM | POA: Diagnosis not present

## 2024-03-20 DIAGNOSIS — L821 Other seborrheic keratosis: Secondary | ICD-10-CM | POA: Diagnosis not present

## 2024-03-20 DIAGNOSIS — L814 Other melanin hyperpigmentation: Secondary | ICD-10-CM | POA: Diagnosis not present

## 2024-03-21 DIAGNOSIS — R051 Acute cough: Secondary | ICD-10-CM | POA: Diagnosis not present

## 2024-10-22 ENCOUNTER — Ambulatory Visit
